# Patient Record
Sex: Female | Born: 1935
Health system: Southern US, Community
[De-identification: ages and names within clinical notes are randomized; demographics above are authoritative.]

## PROBLEM LIST (undated history)

## (undated) DIAGNOSIS — C50919 Malignant neoplasm of unspecified site of unspecified female breast: Secondary | ICD-10-CM

## (undated) DIAGNOSIS — I10 Essential (primary) hypertension: Secondary | ICD-10-CM

## (undated) DIAGNOSIS — E079 Disorder of thyroid, unspecified: Secondary | ICD-10-CM

## (undated) HISTORY — DX: Malignant neoplasm of unspecified site of unspecified female breast: C50.919

## (undated) HISTORY — PX: ABDOMINAL HYSTERECTOMY: SHX81

## (undated) HISTORY — DX: Essential (primary) hypertension: I10

## (undated) HISTORY — PX: BREAST LUMPECTOMY: SHX2

## (undated) HISTORY — DX: Disorder of thyroid, unspecified: E07.9

---

## 2016-03-06 DIAGNOSIS — H04123 Dry eye syndrome of bilateral lacrimal glands: Secondary | ICD-10-CM | POA: Diagnosis not present

## 2016-03-06 DIAGNOSIS — H2513 Age-related nuclear cataract, bilateral: Secondary | ICD-10-CM | POA: Diagnosis not present

## 2016-03-06 DIAGNOSIS — H35033 Hypertensive retinopathy, bilateral: Secondary | ICD-10-CM | POA: Diagnosis not present

## 2016-06-26 DIAGNOSIS — N289 Disorder of kidney and ureter, unspecified: Secondary | ICD-10-CM | POA: Diagnosis not present

## 2016-06-26 DIAGNOSIS — I10 Essential (primary) hypertension: Secondary | ICD-10-CM | POA: Diagnosis not present

## 2016-06-26 DIAGNOSIS — R1030 Lower abdominal pain, unspecified: Secondary | ICD-10-CM | POA: Diagnosis not present

## 2016-06-26 DIAGNOSIS — E0590 Thyrotoxicosis, unspecified without thyrotoxic crisis or storm: Secondary | ICD-10-CM | POA: Diagnosis not present

## 2016-06-26 DIAGNOSIS — R1033 Periumbilical pain: Secondary | ICD-10-CM | POA: Diagnosis not present

## 2016-06-26 DIAGNOSIS — C50919 Malignant neoplasm of unspecified site of unspecified female breast: Secondary | ICD-10-CM | POA: Diagnosis not present

## 2016-06-26 DIAGNOSIS — Z853 Personal history of malignant neoplasm of breast: Secondary | ICD-10-CM | POA: Diagnosis not present

## 2016-07-01 DIAGNOSIS — N644 Mastodynia: Secondary | ICD-10-CM | POA: Diagnosis not present

## 2017-03-12 DIAGNOSIS — H35033 Hypertensive retinopathy, bilateral: Secondary | ICD-10-CM | POA: Diagnosis not present

## 2017-03-12 DIAGNOSIS — H2513 Age-related nuclear cataract, bilateral: Secondary | ICD-10-CM | POA: Diagnosis not present

## 2017-10-06 DIAGNOSIS — R9431 Abnormal electrocardiogram [ECG] [EKG]: Secondary | ICD-10-CM | POA: Diagnosis not present

## 2017-10-06 DIAGNOSIS — R109 Unspecified abdominal pain: Secondary | ICD-10-CM | POA: Diagnosis not present

## 2017-10-06 DIAGNOSIS — I517 Cardiomegaly: Secondary | ICD-10-CM | POA: Diagnosis not present

## 2017-10-06 DIAGNOSIS — I491 Atrial premature depolarization: Secondary | ICD-10-CM | POA: Diagnosis not present

## 2017-11-03 DIAGNOSIS — I491 Atrial premature depolarization: Secondary | ICD-10-CM | POA: Diagnosis not present

## 2017-11-03 DIAGNOSIS — I5023 Acute on chronic systolic (congestive) heart failure: Secondary | ICD-10-CM | POA: Diagnosis not present

## 2017-11-03 DIAGNOSIS — R0602 Shortness of breath: Secondary | ICD-10-CM | POA: Diagnosis not present

## 2017-11-03 DIAGNOSIS — N179 Acute kidney failure, unspecified: Secondary | ICD-10-CM | POA: Diagnosis not present

## 2017-11-03 DIAGNOSIS — R7989 Other specified abnormal findings of blood chemistry: Secondary | ICD-10-CM | POA: Diagnosis not present

## 2017-11-03 DIAGNOSIS — R1030 Lower abdominal pain, unspecified: Secondary | ICD-10-CM | POA: Diagnosis not present

## 2017-11-03 DIAGNOSIS — J811 Chronic pulmonary edema: Secondary | ICD-10-CM | POA: Diagnosis not present

## 2017-11-03 DIAGNOSIS — I517 Cardiomegaly: Secondary | ICD-10-CM | POA: Diagnosis not present

## 2017-11-03 DIAGNOSIS — I130 Hypertensive heart and chronic kidney disease with heart failure and stage 1 through stage 4 chronic kidney disease, or unspecified chronic kidney disease: Secondary | ICD-10-CM | POA: Diagnosis not present

## 2017-11-03 DIAGNOSIS — N183 Chronic kidney disease, stage 3 (moderate): Secondary | ICD-10-CM | POA: Diagnosis not present

## 2017-11-03 DIAGNOSIS — F0390 Unspecified dementia without behavioral disturbance: Secondary | ICD-10-CM | POA: Diagnosis not present

## 2017-11-03 DIAGNOSIS — I723 Aneurysm of iliac artery: Secondary | ICD-10-CM | POA: Diagnosis not present

## 2017-11-03 DIAGNOSIS — K828 Other specified diseases of gallbladder: Secondary | ICD-10-CM | POA: Diagnosis not present

## 2017-11-03 DIAGNOSIS — N390 Urinary tract infection, site not specified: Secondary | ICD-10-CM | POA: Diagnosis not present

## 2017-11-03 DIAGNOSIS — I110 Hypertensive heart disease with heart failure: Secondary | ICD-10-CM | POA: Diagnosis not present

## 2017-11-03 DIAGNOSIS — R918 Other nonspecific abnormal finding of lung field: Secondary | ICD-10-CM | POA: Diagnosis not present

## 2017-11-03 DIAGNOSIS — E8770 Fluid overload, unspecified: Secondary | ICD-10-CM | POA: Diagnosis not present

## 2017-11-03 DIAGNOSIS — I5021 Acute systolic (congestive) heart failure: Secondary | ICD-10-CM | POA: Diagnosis not present

## 2017-11-03 DIAGNOSIS — I5031 Acute diastolic (congestive) heart failure: Secondary | ICD-10-CM | POA: Diagnosis not present

## 2017-11-03 DIAGNOSIS — J90 Pleural effusion, not elsewhere classified: Secondary | ICD-10-CM | POA: Diagnosis not present

## 2017-11-04 DIAGNOSIS — I10 Essential (primary) hypertension: Secondary | ICD-10-CM | POA: Diagnosis not present

## 2017-11-04 DIAGNOSIS — I340 Nonrheumatic mitral (valve) insufficiency: Secondary | ICD-10-CM | POA: Diagnosis not present

## 2017-11-04 DIAGNOSIS — J90 Pleural effusion, not elsewhere classified: Secondary | ICD-10-CM | POA: Diagnosis not present

## 2017-11-04 DIAGNOSIS — F0390 Unspecified dementia without behavioral disturbance: Secondary | ICD-10-CM | POA: Diagnosis not present

## 2017-11-04 DIAGNOSIS — I517 Cardiomegaly: Secondary | ICD-10-CM | POA: Diagnosis not present

## 2017-11-04 DIAGNOSIS — I5021 Acute systolic (congestive) heart failure: Secondary | ICD-10-CM | POA: Diagnosis not present

## 2017-11-04 DIAGNOSIS — I723 Aneurysm of iliac artery: Secondary | ICD-10-CM | POA: Diagnosis not present

## 2017-11-04 DIAGNOSIS — K828 Other specified diseases of gallbladder: Secondary | ICD-10-CM | POA: Diagnosis not present

## 2017-11-04 DIAGNOSIS — I509 Heart failure, unspecified: Secondary | ICD-10-CM | POA: Diagnosis not present

## 2017-11-04 DIAGNOSIS — R7989 Other specified abnormal findings of blood chemistry: Secondary | ICD-10-CM | POA: Diagnosis not present

## 2017-11-04 DIAGNOSIS — N179 Acute kidney failure, unspecified: Secondary | ICD-10-CM | POA: Diagnosis not present

## 2017-11-05 DIAGNOSIS — N183 Chronic kidney disease, stage 3 (moderate): Secondary | ICD-10-CM | POA: Diagnosis present

## 2017-11-05 DIAGNOSIS — Z8249 Family history of ischemic heart disease and other diseases of the circulatory system: Secondary | ICD-10-CM | POA: Diagnosis not present

## 2017-11-05 DIAGNOSIS — Z9089 Acquired absence of other organs: Secondary | ICD-10-CM | POA: Diagnosis not present

## 2017-11-05 DIAGNOSIS — R7989 Other specified abnormal findings of blood chemistry: Secondary | ICD-10-CM | POA: Diagnosis not present

## 2017-11-05 DIAGNOSIS — Z9889 Other specified postprocedural states: Secondary | ICD-10-CM | POA: Diagnosis not present

## 2017-11-05 DIAGNOSIS — N390 Urinary tract infection, site not specified: Secondary | ICD-10-CM | POA: Diagnosis present

## 2017-11-05 DIAGNOSIS — I5021 Acute systolic (congestive) heart failure: Secondary | ICD-10-CM | POA: Diagnosis not present

## 2017-11-05 DIAGNOSIS — I719 Aortic aneurysm of unspecified site, without rupture: Secondary | ICD-10-CM | POA: Diagnosis present

## 2017-11-05 DIAGNOSIS — J90 Pleural effusion, not elsewhere classified: Secondary | ICD-10-CM | POA: Diagnosis not present

## 2017-11-05 DIAGNOSIS — Z7982 Long term (current) use of aspirin: Secondary | ICD-10-CM | POA: Diagnosis not present

## 2017-11-05 DIAGNOSIS — R109 Unspecified abdominal pain: Secondary | ICD-10-CM | POA: Diagnosis present

## 2017-11-05 DIAGNOSIS — Z79891 Long term (current) use of opiate analgesic: Secondary | ICD-10-CM | POA: Diagnosis not present

## 2017-11-05 DIAGNOSIS — Z853 Personal history of malignant neoplasm of breast: Secondary | ICD-10-CM | POA: Diagnosis not present

## 2017-11-05 DIAGNOSIS — I5023 Acute on chronic systolic (congestive) heart failure: Secondary | ICD-10-CM | POA: Diagnosis present

## 2017-11-05 DIAGNOSIS — I723 Aneurysm of iliac artery: Secondary | ICD-10-CM | POA: Diagnosis not present

## 2017-11-05 DIAGNOSIS — F0390 Unspecified dementia without behavioral disturbance: Secondary | ICD-10-CM | POA: Diagnosis not present

## 2017-11-05 DIAGNOSIS — I509 Heart failure, unspecified: Secondary | ICD-10-CM | POA: Diagnosis not present

## 2017-11-05 DIAGNOSIS — I10 Essential (primary) hypertension: Secondary | ICD-10-CM | POA: Diagnosis not present

## 2017-11-05 DIAGNOSIS — I130 Hypertensive heart and chronic kidney disease with heart failure and stage 1 through stage 4 chronic kidney disease, or unspecified chronic kidney disease: Secondary | ICD-10-CM | POA: Diagnosis present

## 2017-11-05 DIAGNOSIS — Z79899 Other long term (current) drug therapy: Secondary | ICD-10-CM | POA: Diagnosis not present

## 2017-11-05 DIAGNOSIS — N179 Acute kidney failure, unspecified: Secondary | ICD-10-CM | POA: Diagnosis not present

## 2017-11-19 ENCOUNTER — Emergency Department (HOSPITAL_COMMUNITY): Payer: Medicare Other

## 2017-11-19 ENCOUNTER — Other Ambulatory Visit: Payer: Self-pay

## 2017-11-19 ENCOUNTER — Emergency Department (HOSPITAL_COMMUNITY)
Admission: EM | Admit: 2017-11-19 | Discharge: 2017-11-19 | Disposition: A | Discharge status: Home / Self Care | Payer: Medicare Other | Attending: Emergency Medicine | Admitting: Emergency Medicine

## 2017-11-19 DIAGNOSIS — R079 Chest pain, unspecified: Secondary | ICD-10-CM | POA: Diagnosis not present

## 2017-11-19 DIAGNOSIS — G479 Sleep disorder, unspecified: Secondary | ICD-10-CM | POA: Diagnosis not present

## 2017-11-19 DIAGNOSIS — I480 Paroxysmal atrial fibrillation: Secondary | ICD-10-CM | POA: Diagnosis not present

## 2017-11-19 DIAGNOSIS — J9811 Atelectasis: Secondary | ICD-10-CM | POA: Diagnosis not present

## 2017-11-19 DIAGNOSIS — R7989 Other specified abnormal findings of blood chemistry: Secondary | ICD-10-CM | POA: Diagnosis not present

## 2017-11-19 DIAGNOSIS — J90 Pleural effusion, not elsewhere classified: Secondary | ICD-10-CM | POA: Diagnosis not present

## 2017-11-19 DIAGNOSIS — Z5321 Procedure and treatment not carried out due to patient leaving prior to being seen by health care provider: Secondary | ICD-10-CM | POA: Diagnosis not present

## 2017-11-19 DIAGNOSIS — Z853 Personal history of malignant neoplasm of breast: Secondary | ICD-10-CM | POA: Diagnosis not present

## 2017-11-19 DIAGNOSIS — Z0000 Encounter for general adult medical examination without abnormal findings: Secondary | ICD-10-CM | POA: Diagnosis not present

## 2017-11-19 DIAGNOSIS — I10 Essential (primary) hypertension: Secondary | ICD-10-CM | POA: Diagnosis not present

## 2017-11-19 DIAGNOSIS — Z8639 Personal history of other endocrine, nutritional and metabolic disease: Secondary | ICD-10-CM | POA: Diagnosis not present

## 2017-11-19 LAB — CBC
HCT: 35 % — ABNORMAL LOW (ref 36.0–46.0)
Hemoglobin: 11.2 g/dL — ABNORMAL LOW (ref 12.0–15.0)
MCH: 28.4 pg (ref 26.0–34.0)
MCHC: 32 g/dL (ref 30.0–36.0)
MCV: 88.6 fL (ref 78.0–100.0)
Platelets: 244 10*3/uL (ref 150–400)
RBC: 3.95 MIL/uL (ref 3.87–5.11)
RDW: 14.7 % (ref 11.5–15.5)
WBC: 7.2 10*3/uL (ref 4.0–10.5)

## 2017-11-19 LAB — BASIC METABOLIC PANEL
Anion gap: 10 (ref 5–15)
BUN: 15 mg/dL (ref 6–20)
CO2: 25 mmol/L (ref 22–32)
Calcium: 8.9 mg/dL (ref 8.9–10.3)
Chloride: 102 mmol/L (ref 101–111)
Creatinine, Ser: 0.98 mg/dL (ref 0.44–1.00)
GFR calc Af Amer: 60 mL/min — ABNORMAL LOW (ref >60)
GFR calc non Af Amer: 52 mL/min — ABNORMAL LOW (ref >60)
Glucose, Bld: 95 mg/dL (ref 65–99)
Potassium: 4.3 mmol/L (ref 3.5–5.1)
Sodium: 137 mmol/L (ref 135–145)

## 2017-11-19 LAB — I-STAT TROPONIN, ED: Troponin i, poc: 0.04 ng/mL (ref 0.00–0.08)

## 2017-11-19 NOTE — ED Notes (Signed)
FAX sent from doctor's office until roomed

## 2017-11-19 NOTE — ED Notes (Signed)
Pt. Stated they were tired and wanted to leave. Pt. Has left.

## 2017-11-19 NOTE — ED Triage Notes (Signed)
Pt sent from MD's office for elevated cardiac enzymes. Pt denies chest pain/shortness of breath. States she is transitioning from [place]. To [place]. Information from MDs office trop I 0.07.

## 2017-11-20 ENCOUNTER — Encounter: Payer: Self-pay | Admitting: Cardiovascular Disease

## 2017-11-20 ENCOUNTER — Other Ambulatory Visit: Payer: Self-pay | Admitting: Nurse Practitioner

## 2017-11-20 ENCOUNTER — Ambulatory Visit (INDEPENDENT_AMBULATORY_CARE_PROVIDER_SITE_OTHER): Payer: BLUE CROSS/BLUE SHIELD | Admitting: Cardiovascular Disease

## 2017-11-20 VITALS — BP 148/89 | HR 75 | Resp 16 | Ht 64.0 in | Wt 141.8 lb

## 2017-11-20 DIAGNOSIS — I509 Heart failure, unspecified: Secondary | ICD-10-CM

## 2017-11-20 DIAGNOSIS — E7800 Pure hypercholesterolemia, unspecified: Secondary | ICD-10-CM

## 2017-11-20 DIAGNOSIS — I10 Essential (primary) hypertension: Secondary | ICD-10-CM

## 2017-11-20 DIAGNOSIS — E0590 Thyrotoxicosis, unspecified without thyrotoxic crisis or storm: Secondary | ICD-10-CM

## 2017-11-20 DIAGNOSIS — Z853 Personal history of malignant neoplasm of breast: Secondary | ICD-10-CM | POA: Diagnosis not present

## 2017-11-20 DIAGNOSIS — R072 Precordial pain: Secondary | ICD-10-CM | POA: Diagnosis not present

## 2017-11-20 DIAGNOSIS — R7989 Other specified abnormal findings of blood chemistry: Secondary | ICD-10-CM | POA: Diagnosis not present

## 2017-11-20 NOTE — Progress Notes (Signed)
Cardiology Consultation Note:    Date:  11/20/2017   ID:  Diane Ross, DOB [birth year], MRN [id number]  PCP:  [name], NP  Cardiologist:  New  Referring MD: [name], NP  Chief Complaint  Patient presents with  . Hospitalization Follow-up    elevated troponin, chest pain   Diane Ross is a 84 y.o. female who is being seen today for the evaluation of abnormal troponin at the request of [name], NP  History of Present Illness:    Diane Ross is a 84 y.o. female with a hx of long-standing hypertension, possible recent diagnosis of congestive heart failure, referred for evaluation of ECG abnormalities and abnormal troponin level checked yesterday.  Diane Ross has relocated to [facility] from [place] and will be living here with her daughter.  She has long-standing high blood pressure, but has not had previous problems with cardiac illness until last month.  She moved to one of her brother's home and believes that all her problems began because she was sleeping close to the furnace and was somehow exposed to abnormal fumes.  It sounds like she developed shortness of breath and was hospitalized for 4 days and discharged with a diagnosis of congestive heart failure.  She is not confident, but believes that she was told that her heart was weak.  Her discharge medications included carvedilol and furosemide.  She is also taking atenolol.  We do not have the records from the hospital stay in [place].    She was seen in the [place] office yesterday.  She was not having chest discomfort at the time, but reported that she had had chest discomfort off and on over the last month.  She finds it very hard to describe her symptoms, keeps returning to the issues related to the emissions from her brother's furnace.  In the office, the ECG showed frequent ectopy and there was some concern about atrial fibrillation, but the ECG only shows PACs, sometimes sequential.  An  event monitor Zio patch was placed. After going home, she was called back and told that her cardiac troponin I was elevated (0.07, upper limit of normal 0.04) and she was referred to the emergency room.  She did check into the emergency room at [facility], but left after a 3-hour wait., after being told that there were still 20 patients ahead of her.  ED ECG showed sinus rhythm with PACs, LVH with repolarization abnormalities. Troponin was only 0.04 (point-of-care).  Creatinine was 0.98, potassium 4.3, hemoglobin 11.2. Chest x-ray showed small bilateral effusions and bibasilar atelectasis, right greater than left, "negative for edema".  She states that she feels fine today.  She denies any problems with shortness of breath.  She denies orthopnea or PND.  Her daughter states that she sleeps on 2 pillows and that she has noted that her breathing seems to be labored while asleep.  She has not had palpitations or syncope.  She denies any recent chest discomfort.  She does have ankle swelling, but but does not believe that it is a big deal.  Notably her labs from [facility] also include a TSH of 0.051.  She does not take thyroid supplements.  She has not smoked since 1954.  She has a history of left breast cancer treated with lumpectomy.  She has previously undergone hysterectomy.  Past Medical History:  Diagnosis Date  . Breast cancer ([place])    left side  . Hypertension   . Thyroid disorder  Past Surgical History:  Procedure Laterality Date  . ABDOMINAL HYSTERECTOMY    . BREAST LUMPECTOMY Left     Current Medications: Current Meds  Medication Sig  . aspirin EC 81 MG tablet Take 81 mg by mouth daily.  . carvedilol (COREG) 3.125 MG tablet Take 3.125 mg by mouth 2 (two) times daily with a meal.  . furosemide (LASIX) 40 MG tablet Take 40 mg by mouth daily.  . potassium chloride SA (K-DUR,KLOR-CON) 20 MEQ tablet Take 20 mEq by mouth daily.  . [DISCONTINUED] atenolol (TENORMIN) 50 MG tablet Take 25  mg by mouth daily.     Allergies:   Patient has no known allergies.   Social History   Socioeconomic History  . Marital status: Widowed    Spouse name: None  . Number of children: None  . Years of education: None  . Highest education level: None  Social Needs  . Financial resource strain: None  . Food insecurity - worry: None  . Food insecurity - inability: None  . Transportation needs - medical: None  . Transportation needs - non-medical: None  Occupational History  . Occupation: retired  Tobacco Use  . Smoking status: Never Smoker  . Smokeless tobacco: Never Used  Substance and Sexual Activity  . Alcohol use: No    Frequency: Never  . Drug use: No  . Sexual activity: No  Other Topics Concern  . None  Social History Narrative  . None     Family History: The patient's mother had diabetes.   1 of her brothers had diabetes and drug issues and has passed away.  Another brother is alive and well at age 24.  She has a healthy sister.  2 of her daughters have hypertension there does not appear to be a prominent family history of coronary disease. ROS:   Please see the history of present illness.    all other systems reviewed and are negative.  EKGs/Labs/Other Studies Reviewed:    The following studies were reviewed today: Notes from primary care provider.  There are no records from her recent hospitalization in [place]   EKG:  EKG is  ordered today.  The ekg ordered today demonstrates normal sinus rhythm with only voltage criteria for LVH and without any repolarization changes.  Borderline QTC 469 ms  Last night ECG at [facility] ED shows sinus rhythm with left ventricular hypertrophy and prominent repolarization changes in leads I, aVL and V6 PACs and atrial couplets are seen.  The QTC was 470 ms  Recent Labs: 11/19/2017: BUN 15; Creatinine, Ser 0.98; Hemoglobin 11.2; Platelets 244; Potassium 4.3; Sodium 137  Recent Lipid Panel Total cholesterol 160, triglycerides 72,  HDL 31, calculated LDL 115, creatinine 0.96, sodium 137, potassium 5.0, hemoglobin 11.6, TSH 0.051, normal liver function tests (labs performed February 6 at [facility])  Physical Exam:    VS:  BP (!) 148/89   Pulse 75   Resp 16   Ht 5\' 4"  (1.626 m)   Wt 141 lb 12.8 oz (64.3 kg)   SpO2 97%   BMI 24.34 kg/m     Wt Readings from Last 3 Encounters:  11/20/17 141 lb 12.8 oz (64.3 kg)  11/19/17 142 lb (64.4 kg)     GEN:  Well nourished, well developed in no acute distress HEENT: Normal NECK: No JVD; No carotid bruits LYMPHATICS: No lymphadenopathy CARDIAC: RRR, no murmurs, rubs, gallops RESPIRATORY:  Clear to auscultation without rales, wheezing or rhonchi  ABDOMEN: Soft, non-tender, non-distended MUSCULOSKELETAL:  Symmetrical 2+ ankle edema; No deformity  SKIN: Warm and dry NEUROLOGIC:  Alert and oriented x 3 PSYCHIATRIC:  Normal affect   ASSESSMENT:    1. Precordial chest pain   2. Acute congestive heart failure, unspecified heart failure type ([place])   3. Essential hypertension   4. Hypercholesterolemia   5. Abnormal TSH   6. History of cancer of left breast    PLAN:    In order of problems listed above:  1. Chest pain: It is difficult to get her to give a detailed description about the symptoms.  They do not appear to be exertional.  I think the minimal elevation in troponin is a bit of a red herring.   It was not confirmed by repeat testing later the same day.  It is possible that it signifies congestive heart failure rather than coronary ischemia. I am however concerned about the dynamic nature of the repolarization changes on her ECG. With her age, coronary disease is definitely a possibility and I think she should undergo a Lexiscan Myoview.  This will also provide [place] some preliminary information about her left ventricular systolic function. 2. CHF: History from last month in [place] strongly suggestive of an episode of heart failure exacerbation.  It is possible that it was  not exposure to admissions from the furnace that caused the problem, but a change in her dietary intake of sodium when her living conditions changed.  We do not have the records, but the fact that she was started on carvedilol does suggest that she was found to have depressed LVEF.  Discussed sodium restriction, daily weight monitoring, weight log, signs and symptoms of heart failure exacerbation.  We will get records from [place].  If we can get her hands on these, we will need to repeat her echocardiogram.  Add ARB. 3. HTN: Whether she has systolic or diastolic heart failure her blood pressure is too high.  Add losartan 25 mg once daily, continue carvedilol, stop atenolol, reevaluate in clinic in a few days. 4. HLP: LDL cholesterol 115 is not too bad, but if coronary disease is identified that the target would be 17 mg/deciliter or less. 5. Hyperthyroidism: She is not tachycardic but is on a beta-blocker.  No other clear findings to suggest thyrotoxicosis.  Needs a more comprehensive thyroid profile and probably a referral to endocrinology.  I do not see a goiter. 6. Hx left breast Ca: Need to find out whether she received radiation therapy or potentially cardiotoxic anthracycline chemotherapy.  I did not get a chance to ask that question today.   Medication Adjustments/Labs and Tests Ordered: Current medicines are reviewed at length with the patient today.  Concerns regarding medicines are outlined above.  Orders Placed This Encounter  Procedures  . MYOCARDIAL PERFUSION IMAGING  . EKG 12-Lead   No orders of the defined types were placed in this encounter.   Signed, Sanda Klein, MD  11/20/2017 7:54 PM    [place]

## 2017-11-20 NOTE — Patient Instructions (Signed)
Medication Instructions: Dr [name] has recommended making the following medication changes: 1. STOP Atenolol 2. START Losartan 25 mg - take 1 tablet by mouth daily  Labwork: NONE ORDERED  Testing/Procedures: 1. [place] physician has requested that you have a lexiscan myoview. For further information please visit [email or web address]. Please follow instruction sheet, as given.  Follow-up: Dr [name] recommends that you schedule a follow-up appointment in 1 month.  If you need a refill on your cardiac medications before your next appointment, please call your pharmacy.

## 2017-11-21 ENCOUNTER — Telehealth (HOSPITAL_COMMUNITY): Payer: Self-pay | Admitting: Radiology

## 2017-11-21 ENCOUNTER — Telehealth: Payer: Self-pay | Admitting: Cardiovascular Disease

## 2017-11-21 ENCOUNTER — Telehealth (HOSPITAL_COMMUNITY): Payer: Self-pay | Admitting: *Deleted

## 2017-11-21 NOTE — Telephone Encounter (Signed)
Patient given detailed instructions per Myocardial Perfusion Study Information Sheet for the test on 11/24/2017 at 10:00. Patient notified to arrive 15 minutes early and that it is imperative to arrive on time for appointment to keep from having the test rescheduled.  If you need to cancel or reschedule your appointment, please call the office within 24 hours of your appointment. . Patient verbalized understanding.EHK

## 2017-11-21 NOTE — Telephone Encounter (Signed)
Left message on voicemail in reference to upcoming appointment scheduled for 11/24/17. Phone number given for a call back so details instructions can be given. Diane Ross

## 2017-11-21 NOTE — Telephone Encounter (Signed)
Pt was here yesterday and her Losartan was called in.It is not at the pharmacy.Please call [facility] to [facility] on [facility].

## 2017-11-24 ENCOUNTER — Ambulatory Visit (HOSPITAL_COMMUNITY): Payer: Medicare Other | Attending: Cardiology

## 2017-11-24 DIAGNOSIS — I119 Hypertensive heart disease without heart failure: Secondary | ICD-10-CM | POA: Diagnosis not present

## 2017-11-24 DIAGNOSIS — R072 Precordial pain: Secondary | ICD-10-CM

## 2017-11-24 DIAGNOSIS — I2510 Atherosclerotic heart disease of native coronary artery without angina pectoris: Secondary | ICD-10-CM | POA: Diagnosis not present

## 2017-11-24 DIAGNOSIS — I428 Other cardiomyopathies: Secondary | ICD-10-CM | POA: Diagnosis not present

## 2017-11-24 DIAGNOSIS — R0602 Shortness of breath: Secondary | ICD-10-CM | POA: Diagnosis not present

## 2017-11-24 LAB — MYOCARDIAL PERFUSION IMAGING
LV dias vol: 136 mL (ref 46–106)
LV sys vol: 75 mL
Peak HR: 105 {beats}/min
RATE: 0.31
Rest HR: 68 {beats}/min
SDS: 0
SRS: 2
SSS: 2
TID: 0.87

## 2017-11-24 MED ORDER — TECHNETIUM TC 99M TETROFOSMIN IV KIT
10.5000 | PACK | Freq: Once | INTRAVENOUS | Status: AC | PRN
  Administered 2017-11-24: 10.5 via INTRAVENOUS
  Filled 2017-11-24: qty 11

## 2017-11-24 MED ORDER — TECHNETIUM TC 99M TETROFOSMIN IV KIT
31.5000 | PACK | Freq: Once | INTRAVENOUS | Status: AC | PRN
  Administered 2017-11-24: 31.5 via INTRAVENOUS
  Filled 2017-11-24: qty 32

## 2017-11-24 MED ORDER — REGADENOSON 0.4 MG/5ML IV SOLN
0.4000 mg | Freq: Once | INTRAVENOUS | Status: AC
  Administered 2017-11-24: 0.4 mg via INTRAVENOUS

## 2017-11-24 MED ORDER — LOSARTAN POTASSIUM 25 MG PO TABS: 25.0000 mg | ORAL_TABLET | Freq: Every day | ORAL | 3 refills | Status: DC

## 2017-11-25 ENCOUNTER — Encounter: Payer: Self-pay | Admitting: Adult Health

## 2017-11-25 ENCOUNTER — Ambulatory Visit (INDEPENDENT_AMBULATORY_CARE_PROVIDER_SITE_OTHER): Payer: Medicare Other | Admitting: Adult Health

## 2017-11-25 VITALS — BP 138/72 | HR 68 | Ht 66.0 in | Wt 135.6 lb

## 2017-11-25 DIAGNOSIS — I43 Cardiomyopathy in diseases classified elsewhere: Secondary | ICD-10-CM | POA: Diagnosis not present

## 2017-11-25 DIAGNOSIS — E0590 Thyrotoxicosis, unspecified without thyrotoxic crisis or storm: Secondary | ICD-10-CM

## 2017-11-25 DIAGNOSIS — I10 Essential (primary) hypertension: Secondary | ICD-10-CM | POA: Diagnosis not present

## 2017-11-25 NOTE — Patient Instructions (Signed)
Medication Instructions:  NO CHANGES-Your physician recommends that you continue on your current medications as directed. Please refer to the Current Medication list given to you today.  If you need a refill on your cardiac medications before your next appointment, please call your pharmacy.  Follow-Up: Your physician wants you to follow-up:  [place]  Thank you for choosing [facility] at [facility]!!

## 2017-11-25 NOTE — Progress Notes (Signed)
Cardiology Office Note   Date:  11/25/2017   ID:  Diane Ross, DOB [birth year], MRN [id number]  PCP:  [name], NP  Cardiologist: Dr.  [name]  Chief Complaint  Patient presents with  . Follow-up     History of Present Illness: Diane Ross is a 84 y.o. female who presents for ongoing assessment and management of CHF, long-standing hypertension. She was seen Dr. [name] oh on 11/20/2017 after moving from [place] to [place] to live with her daughter.  She moved to one of her brother's home and believes that all her problems began because she was sleeping close to the furnace and was somehow exposed to abnormal fumes.  It sounds like she developed shortness of breath and was hospitalized for 4 days and discharged with a diagnosis of congestive heart failure.  She is not confident, but believes that she was told that her heart was weak.  Her discharge medications included carvedilol and furosemide.  She is also taking atenolol.  When she was seen last EKG revealed frequent ectopy with concern for atrial fibrillation that it only showed frequent PACs, sometimes sequential. He has a mildly elevated troponin when last seen, was sent to the emergency room after waiting over 3 hours. Also being worked up by her primary care physician [facility] due to hyperthyroidism with most recent TSH 0.051.    Past Medical History:  Diagnosis Date  . Breast cancer ([place])    left side  . Hypertension   . Thyroid disorder     Past Surgical History:  Procedure Laterality Date  . ABDOMINAL HYSTERECTOMY    . BREAST LUMPECTOMY Left      Current Outpatient Medications  Medication Sig Dispense Refill  . aspirin EC 81 MG tablet Take 81 mg by mouth daily.    . carvedilol (COREG) 3.125 MG tablet Take 3.125 mg by mouth 2 (two) times daily with a meal.    . furosemide (LASIX) 40 MG tablet Take 40 mg by mouth daily.    Marland Kitchen losartan (COZAAR) 25 MG tablet Take 1 tablet (25 mg total) by mouth  daily. 90 tablet 3  . potassium chloride SA (K-DUR,KLOR-CON) 20 MEQ tablet Take 20 mEq by mouth daily.     No current facility-administered medications for this visit.     Allergies:   Patient has no known allergies.    Social History:  The patient  reports that  has never smoked. she has never used smokeless tobacco. She reports that she does not drink alcohol or use drugs.   Family History:  The patient's family history is not on file.    ROS: All other systems are reviewed and negative. Unless otherwise mentioned in H&P    PHYSICAL EXAM: VS:  BP 138/72   Pulse 68   Ht 5\' 6"  (1.676 m)   Wt 135 lb 9.6 oz (61.5 kg)   BMI 21.89 kg/m  , BMI Body mass index is 21.89 kg/m. GEN: Well nourished, well developed, in no acute distress  HEENT: normal  Neck: no JVD, carotid bruits, or masses Cardiac: RRR; no murmurs, rubs, or gallops,no edema  Respiratory:  Clear to auscultation bilaterally, normal work of breathing GI: soft, nontender, nondistended, + BS MS: no deformity or atrophy  Skin: warm and dry, no rash Neuro:  Strength and sensation are intact Psych: euthymic mood, full affect   EKG:  Normal sinus rhythm, nonspecific T-wave abnormality inferiorly. Heart is 60 bpm.  Recent Labs: 11/19/2017: BUN 15; Creatinine, Ser 0.98;  Hemoglobin 11.2; Platelets 244; Potassium 4.3; Sodium 137    Lipid Panel No results found for: CHOL, TRIG, HDL, CHOLHDL, VLDL, LDLCALC, LDLDIRECT    Wt Readings from Last 3 Encounters:  11/25/17 135 lb 9.6 oz (61.5 kg)  11/24/17 141 lb (64 kg)  11/20/17 141 lb 12.8 oz (64.3 kg)      Other studies Reviewed:  Study Highlights     Nuclear stress EF: 45%. Mild diffuse hypokinesis  There was no ST segment deviation noted during stress.  The study is normal. No perfusion defects identified. Dilated LV.  This is an intermediate risk study based upon mildly reduced left ventricular ejection fraction. Non-ischemic cardiomyopathy.          ASSESSMENT AND PLAN:  1. History of systolic CHF: I have looked through Epic to locate any records that may have been sent from [place] as requested by Dr. [name]. These are not available to review. She is currently euvolemic, medically compliant, has some mild complaints dyspnea walking back from the lobby to the exam room today it is not overly dyspneic..   2. Hypertension: The patient was started on losartan 25 mg daily, continued on Lasix 40 mg, and carvedilol. Blood pressure is improved since last office visit, now at 138/72. He is only been taking this medication for the last 2 days and each day her blood pressure has gone down. Her daughter she is living with now is an Therapist, sports and takes her blood pressure.  3. Hyperthyroidism: Currently being worked up by PCP. She is scheduled for a thyroid ultrasound and has been started on medication, but she does not know the name of it. [facility] is also placed cardiac monitor on her to evaluate for rapid heart rhythm and evidence of persistent atrial fibrillation.  Current medicines are reviewed at length with the patient today.    Labs/ tests ordered today include: None  Phill Myron. West Pugh, ANP, AACC   11/25/2017 12:45 PM    [facility] [address] [place], [place] [address] Phone: [phone]; Fax: [phone]

## 2017-11-26 ENCOUNTER — Ambulatory Visit
Admission: RE | Admit: 2017-11-26 | Discharge: 2017-11-26 | Disposition: A | Discharge status: Home / Self Care | Payer: Medicare Other | Source: Ambulatory Visit | Attending: Nurse Practitioner | Admitting: Nurse Practitioner

## 2017-11-26 DIAGNOSIS — E0590 Thyrotoxicosis, unspecified without thyrotoxic crisis or storm: Secondary | ICD-10-CM

## 2017-11-26 DIAGNOSIS — E042 Nontoxic multinodular goiter: Secondary | ICD-10-CM | POA: Diagnosis not present

## 2017-11-27 ENCOUNTER — Other Ambulatory Visit: Payer: Self-pay | Admitting: Nurse Practitioner

## 2017-11-27 DIAGNOSIS — E041 Nontoxic single thyroid nodule: Secondary | ICD-10-CM

## 2017-11-27 DIAGNOSIS — E0590 Thyrotoxicosis, unspecified without thyrotoxic crisis or storm: Secondary | ICD-10-CM

## 2017-12-10 DIAGNOSIS — I480 Paroxysmal atrial fibrillation: Secondary | ICD-10-CM | POA: Diagnosis not present

## 2017-12-15 ENCOUNTER — Other Ambulatory Visit: Payer: Self-pay | Admitting: Cardiovascular Disease

## 2017-12-15 MED ORDER — LOSARTAN POTASSIUM 25 MG PO TABS: 25.0000 mg | ORAL_TABLET | Freq: Every day | ORAL | 1 refills | Status: DC

## 2017-12-15 NOTE — Telephone Encounter (Signed)
Rx(s) sent to pharmacy electronically.  

## 2017-12-15 NOTE — Telephone Encounter (Signed)
°*  STAT* If patient is at the pharmacy, call can be transferred to refill team.   1. Which medications need to be refilled? (please list name of each medication and dose if known) Losartan 25 mg   2. Which pharmacy/location (including street and city if local pharmacy) is medication to be sent to? [address], [place], [place] [address]   3. Do they need a 30 day or 90 day supply? Enough to last to appt on 01-02-18

## 2017-12-26 DIAGNOSIS — R29890 Loss of height: Secondary | ICD-10-CM | POA: Diagnosis not present

## 2017-12-26 DIAGNOSIS — E042 Nontoxic multinodular goiter: Secondary | ICD-10-CM | POA: Diagnosis not present

## 2017-12-26 DIAGNOSIS — D649 Anemia, unspecified: Secondary | ICD-10-CM | POA: Diagnosis not present

## 2017-12-26 DIAGNOSIS — M40204 Unspecified kyphosis, thoracic region: Secondary | ICD-10-CM | POA: Diagnosis not present

## 2017-12-26 DIAGNOSIS — Z13820 Encounter for screening for osteoporosis: Secondary | ICD-10-CM | POA: Diagnosis not present

## 2017-12-26 DIAGNOSIS — E0590 Thyrotoxicosis, unspecified without thyrotoxic crisis or storm: Secondary | ICD-10-CM | POA: Diagnosis not present

## 2017-12-26 DIAGNOSIS — I480 Paroxysmal atrial fibrillation: Secondary | ICD-10-CM | POA: Diagnosis not present

## 2018-01-02 ENCOUNTER — Ambulatory Visit (INDEPENDENT_AMBULATORY_CARE_PROVIDER_SITE_OTHER): Payer: Medicare Other | Admitting: Cardiovascular Disease

## 2018-01-02 ENCOUNTER — Encounter: Payer: Self-pay | Admitting: Cardiovascular Disease

## 2018-01-02 VITALS — BP 152/86 | HR 66 | Ht 66.0 in | Wt 126.0 lb

## 2018-01-02 DIAGNOSIS — E0590 Thyrotoxicosis, unspecified without thyrotoxic crisis or storm: Secondary | ICD-10-CM | POA: Diagnosis not present

## 2018-01-02 DIAGNOSIS — I10 Essential (primary) hypertension: Secondary | ICD-10-CM | POA: Insufficient documentation

## 2018-01-02 DIAGNOSIS — R636 Underweight: Secondary | ICD-10-CM

## 2018-01-02 DIAGNOSIS — E7800 Pure hypercholesterolemia, unspecified: Secondary | ICD-10-CM

## 2018-01-02 DIAGNOSIS — D649 Anemia, unspecified: Secondary | ICD-10-CM | POA: Diagnosis not present

## 2018-01-02 DIAGNOSIS — I5042 Chronic combined systolic (congestive) and diastolic (congestive) heart failure: Secondary | ICD-10-CM

## 2018-01-02 MED ORDER — CARVEDILOL 6.25 MG PO TABS: 6.2500 mg | ORAL_TABLET | Freq: Two times a day (BID) | ORAL | 3 refills | Status: DC

## 2018-01-02 NOTE — Progress Notes (Signed)
Cardiology office note:    Date:  01/02/2018   ID:  Diane Ross, DOB 05-25-1931, MRN [id number]  PCP:  [name], NP  Cardiologist:  New  Referring MD: [name], NP  Chief Complaint  Patient presents with  . Congestive Heart Failure    Follow-up     History of Present Illness:    Diane Ross is a 84 y.o. female with a hx of long-standing hypertension, recent diagnosis of congestive heart failure, returning in follow-up after nuclear stress test..  [place] study confirmed the presence of depressed left ventricular systolic function, EF 60%.  No perfusion abnormalities were seen.  Since her last appointment her weight has decreased by 10 pounds, a total of 16 pounds since February.  She denies orthopnea exertional dyspnea and no longer has leg edema.  She has not been troubled by palpitations, syncope, dizziness, focal neurological complaints or chest pain.  We have not been able to find her previous echo test results yet.  She has not smoked since 1954.  She has a history of left breast cancer treated with lumpectomy.  She is not certain, but believes she may have received chemotherapy.  She does not recognize the words Adriamycin or doxorubicin.  She has previously undergone hysterectomy.  Past Medical History:  Diagnosis Date  . Breast cancer ([place])    left side  . Hypertension   . Thyroid disorder     Past Surgical History:  Procedure Laterality Date  . ABDOMINAL HYSTERECTOMY    . BREAST LUMPECTOMY Left     Current Medications: Current Meds  Medication Sig  . aspirin EC 81 MG tablet Take 81 mg by mouth daily.  . carvedilol (COREG) 6.25 MG tablet Take 1 tablet (6.25 mg total) by mouth 2 (two) times daily with a meal.  . furosemide (LASIX) 40 MG tablet Take 40 mg by mouth daily.  Marland Kitchen losartan (COZAAR) 25 MG tablet Take 1 tablet (25 mg total) by mouth daily.  . potassium chloride SA (K-DUR,KLOR-CON) 20 MEQ tablet Take 20 mEq by mouth daily.  .  [DISCONTINUED] carvedilol (COREG) 3.125 MG tablet Take 3.125 mg by mouth 2 (two) times daily with a meal.     Allergies:   Patient has no known allergies.   Social History   Socioeconomic History  . Marital status: Widowed    Spouse name: Not on file  . Number of children: Not on file  . Years of education: Not on file  . Highest education level: Not on file  Occupational History  . Occupation: retired  Scientific laboratory technician  . Financial resource strain: Not on file  . Food insecurity:    Worry: Not on file    Inability: Not on file  . Transportation needs:    Medical: Not on file    Non-medical: Not on file  Tobacco Use  . Smoking status: Never Smoker  . Smokeless tobacco: Never Used  Substance and Sexual Activity  . Alcohol use: No    Frequency: Never  . Drug use: No  . Sexual activity: Never  Lifestyle  . Physical activity:    Days per week: Not on file    Minutes per session: Not on file  . Stress: Not on file  Relationships  . Social connections:    Talks on phone: Not on file    Gets together: Not on file    Attends religious service: Not on file    Active member of club or organization: Not on file  Attends meetings of clubs or organizations: Not on file    Relationship status: Not on file  Other Topics Concern  . Not on file  Social History Narrative  . Not on file     Family History: The patient's mother had diabetes.   1 of her brothers had diabetes and drug issues and has passed away.  Another brother is alive and well at age 43.  She has a healthy sister.  2 of her daughters have hypertension there does not appear to be a prominent family history of coronary disease. ROS:   Please see the history of present illness.    all other systems reviewed and are negative.  EKGs/Labs/Other Studies Reviewed:    The following studies were reviewed today: Notes from primary care provider.  There are no records from her recent hospitalization in [place]   EKG:   EKG is not ordered today.    Recent Labs: 11/19/2017: BUN 15; Creatinine, Ser 0.98; Hemoglobin 11.2; Platelets 244; Potassium 4.3; Sodium 137  Recent Lipid Panel Total cholesterol 160, triglycerides 72, HDL 31, calculated LDL 115, creatinine 0.96, sodium 137, potassium 5.0, hemoglobin 11.6, TSH 0.051, normal liver function tests (labs performed February 6 at [facility])  Physical Exam:    VS:  BP (!) 152/86   Pulse 66   Ht 5\' 6"  (1.676 m)   Wt 126 lb (57.2 kg)   BMI 20.34 kg/m     Wt Readings from Last 3 Encounters:  01/02/18 126 lb (57.2 kg)  11/25/17 135 lb 9.6 oz (61.5 kg)  11/24/17 141 lb (64 kg)      General: Alert, oriented x3, no distress, she appears very lean, borderline cachectic Head: no evidence of trauma, PERRL, EOMI, no exophtalmos or lid lag, no myxedema, no xanthelasma; normal ears, nose and oropharynx Neck: normal jugular venous pulsations and no hepatojugular reflux; brisk carotid pulses without delay and no carotid bruits Chest: clear to auscultation, no signs of consolidation by percussion or palpation, normal fremitus, symmetrical and full respiratory excursions Cardiovascular: normal position and quality of the apical impulse, regular rhythm, normal first and second heart sounds, no murmurs, rubs or gallops Abdomen: no tenderness or distention, no masses by palpation, no abnormal pulsatility or arterial bruits, normal bowel sounds, no hepatosplenomegaly Extremities: no clubbing, cyanosis or edema; 2+ radial, ulnar and brachial pulses bilaterally; 2+ right femoral, posterior tibial and dorsalis pedis pulses; 2+ left femoral, posterior tibial and dorsalis pedis pulses; no subclavian or femoral bruits Neurological: grossly nonfocal Psych: Normal mood and affect   ASSESSMENT:    1. Chronic combined systolic and diastolic congestive heart failure ([place])   2. Essential hypertension   3. Hypercholesterolemia   4. Hyperthyroidism   5. Mildly underweight adult    PLAN:     In order of problems listed above:  1. CHF: She now appears to be euvolemic.  NYHA functional class I-2.  Edema has resolved.  Weight is down 16 pounds since initial presentation.  Reaffirmed the need for daily weight monitoring, sodium restriction.  Asked her to call our office promptly if her weight reaches 130 pounds on her home scale.  At this point, the bulk of evidence suggest that she has nonischemic cardiomyopathy.  Could be due to insufficiently treated hypertension or possibly a history of anthracycline chemotherapy (difficult to confirm since breast cancer treatment happened approximately 20 years ago).  Even though she has some thyroid abnormalities, I doubt that she has thyrotoxic cardiomyopathy.  Will increase carvedilol to 6.25  mg twice daily today.  Plan to bring her back in a few weeks to increase the losartan.  Told her that the purpose of these medications is for heart failure, not just blood pressure treatment.  We will continue to increase the doses of ARB and beta-blocker steadily until reached a maximum tolerated amount. 2. HTN: Blood pressure remains high, continue to titrate ARB and beta-blocker. 3. HLP: Her LDL cholesterol is a little high, but in the absence of confirmed vascular disease specific additional therapy is not indicated.  She is clearly averse to taking any additional medicines. 4. Hyperthyroidism: Had suppressed TSH.  Clinically no signs of thyrotoxicosis, but this should be followed up with a full thyroid panel.  Thyroid ultrasound showed multiple nodules, many of which were felt to meet criteria for fine-needle aspiration. 5. Underweight: This is more likely related to thyroid abnormalities than it would be to cardiac cachexia.  Her heart failure is not that bad and has been relatively easy   Medication Adjustments/Labs and Tests Ordered: Current medicines are reviewed at length with the patient today.  Concerns regarding medicines are outlined above.  Orders  Placed This Encounter  Procedures  . ECHOCARDIOGRAM COMPLETE   Meds ordered this encounter  Medications  . carvedilol (COREG) 6.25 MG tablet    Sig: Take 1 tablet (6.25 mg total) by mouth 2 (two) times daily with a meal.    Dispense:  180 tablet    Refill:  3    Signed, Sanda Klein, MD  01/02/2018 2:13 PM    [facility]

## 2018-01-02 NOTE — Patient Instructions (Signed)
Dr [name] has recommended making the following medication changes: 1. INCREASE Carvedilol to 6.25 mg twice daily  Your physician has requested that you have an echocardiogram. Echocardiography is a painless test that uses sound waves to create images of your heart. It provides your doctor with information about the size and shape of your heart and how well your heart's chambers and valves are working. This procedure takes approximately one hour. There are no restrictions for this procedure. >> This will be performed at our [facility] location [place], [place] [address] [phone]  Your physician recommends that you weigh, daily, at the same time every day, and in the same amount of clothing. Please record your daily weights on the handout provided and bring it to your next appointment. Please call the office if your weight is over 130 pounds.  Your physician recommends that you schedule a follow-up appointment in 4 weeks with a NP/PA.  Dr [name] recommends that you schedule a follow-up appointment in 3 months.  If you need a refill on your cardiac medications before your next appointment, please call your pharmacy.

## 2018-01-12 ENCOUNTER — Other Ambulatory Visit: Payer: Self-pay

## 2018-01-12 ENCOUNTER — Ambulatory Visit (HOSPITAL_COMMUNITY): Payer: Medicare Other | Attending: Internal Medicine

## 2018-01-12 DIAGNOSIS — I340 Nonrheumatic mitral (valve) insufficiency: Secondary | ICD-10-CM | POA: Diagnosis not present

## 2018-01-12 DIAGNOSIS — I5042 Chronic combined systolic (congestive) and diastolic (congestive) heart failure: Secondary | ICD-10-CM | POA: Diagnosis not present

## 2018-01-12 DIAGNOSIS — I110 Hypertensive heart disease with heart failure: Secondary | ICD-10-CM | POA: Diagnosis not present

## 2018-01-16 ENCOUNTER — Other Ambulatory Visit (HOSPITAL_COMMUNITY): Payer: Self-pay | Admitting: Internal Medicine

## 2018-01-16 DIAGNOSIS — E0590 Thyrotoxicosis, unspecified without thyrotoxic crisis or storm: Secondary | ICD-10-CM

## 2018-01-23 ENCOUNTER — Telehealth: Payer: Self-pay | Admitting: Cardiovascular Disease

## 2018-01-23 NOTE — Telephone Encounter (Signed)
Returned the call to the patient's daughter, per the dpr. She stated that she was concerned that her mother's blood pressure was still elevated after the increase in the Carvedilol to 6.25 mg bid.  She states that her blood pressure yesterday morning was 160/97 and today it was 153/96. These readings were taken prior to her medications. She was asked to check her blood pressure while on the phone to see what it was after she had taken her medication. Her blood pressure was 119/66. The daughter will now take her blood pressure a few hours after the medication and keep a log of these. She will bring them to her next appointment. Scheduling has been notified to help her get this set up.

## 2018-01-23 NOTE — Telephone Encounter (Signed)
Pt c/o BP issue: STAT if pt c/o blurred vision, one-sided weakness or slurred speech  1. What are your last 5 BP readings?  153/96  160/97   142/80   133/74  2. Are you having any other symptoms (ex. Dizziness, headache, blurred vision, passed out)? no  3. What is your BP issue? Diastolic BP is not going down

## 2018-02-05 DIAGNOSIS — Z780 Asymptomatic menopausal state: Secondary | ICD-10-CM | POA: Diagnosis not present

## 2018-02-05 DIAGNOSIS — M8588 Other specified disorders of bone density and structure, other site: Secondary | ICD-10-CM | POA: Diagnosis not present

## 2018-02-09 ENCOUNTER — Encounter: Payer: Self-pay | Admitting: Physician Assistant

## 2018-02-09 ENCOUNTER — Ambulatory Visit (HOSPITAL_COMMUNITY): Payer: Medicare Other

## 2018-02-09 ENCOUNTER — Ambulatory Visit (INDEPENDENT_AMBULATORY_CARE_PROVIDER_SITE_OTHER): Payer: Medicare Other | Admitting: Physician Assistant

## 2018-02-09 VITALS — BP 121/81 | HR 67 | Ht 66.0 in | Wt 127.0 lb

## 2018-02-09 DIAGNOSIS — E041 Nontoxic single thyroid nodule: Secondary | ICD-10-CM | POA: Diagnosis not present

## 2018-02-09 DIAGNOSIS — I10 Essential (primary) hypertension: Secondary | ICD-10-CM

## 2018-02-09 DIAGNOSIS — E785 Hyperlipidemia, unspecified: Secondary | ICD-10-CM | POA: Diagnosis not present

## 2018-02-09 DIAGNOSIS — E042 Nontoxic multinodular goiter: Secondary | ICD-10-CM | POA: Diagnosis not present

## 2018-02-09 DIAGNOSIS — I519 Heart disease, unspecified: Secondary | ICD-10-CM | POA: Diagnosis not present

## 2018-02-09 DIAGNOSIS — H1045 Other chronic allergic conjunctivitis: Secondary | ICD-10-CM | POA: Diagnosis not present

## 2018-02-09 DIAGNOSIS — H2513 Age-related nuclear cataract, bilateral: Secondary | ICD-10-CM | POA: Diagnosis not present

## 2018-02-09 DIAGNOSIS — H25013 Cortical age-related cataract, bilateral: Secondary | ICD-10-CM | POA: Diagnosis not present

## 2018-02-09 DIAGNOSIS — E0590 Thyrotoxicosis, unspecified without thyrotoxic crisis or storm: Secondary | ICD-10-CM | POA: Diagnosis not present

## 2018-02-09 MED ORDER — LOSARTAN POTASSIUM 25 MG PO TABS: 25.0000 mg | ORAL_TABLET | Freq: Two times a day (BID) | ORAL | 6 refills | Status: DC

## 2018-02-09 NOTE — Progress Notes (Signed)
Cardiology Office Note    Date:  02/09/2018   ID:  Diane Ross, DOB August 15, 1931, MRN [id number]  PCP:  [name], NP  Cardiologist:  Dr. [name]   Chief Complaint  Patient presents with  . Follow-up    seen for Dr. [name], high blood pressure at home.    History of Present Illness:  Diane Ross is a 84 y.o. female with PMH of long standing h/o HTN, HLD, thyroid nodule, and recently diagnosed LV dysfunction.  Myoview obtained on 11/24/2017 showed EF 45%, mild diffuse hypokinesis, otherwise no ischemia or infarction.  This was felt to be nonischemic cardiomyopathy.  Ultrasound of the thyroid obtained on 11/26/2017 showed multiple thyroid nodule, fine-needle aspiration biopsy was recommended.  During the last office visit in March 2019, her blood pressure remain elevated in the 150s range, carvedilol was increased to 6.25 mg twice daily. Echocardiogram obtained on 01/12/2018 showed EF 50 to 55%, grade 1 DD, mild MR, moderately dilated left atrium.   Patient presents today along with her daughter for evaluation of uncontrolled high blood pressure.  Based on home blood pressure diary, her blood pressure has been ranging in the 150s to 160s every morning prior to blood pressure medication.  After the blood pressure medication, her blood pressure has been ranging between 110s to 130s and one time episode of 190s.  She denies any orthostatic dizziness.  She denies any chest pain or shortness of breath.  Although there was some mention of lower extremity swelling, I do not see any on physical exam.  Her lung is clear, there is no sign of heart failure on exam.  She is on moderate dose of diuretic since earlier this year, she is also on potassium supplement along with losartan.  I recommended basic metabolic panel, however she says she just had a lab work earlier this morning at Dr. Cindra Eves office.  We will request lab work, otherwise she has close outpatient visit with her PCP, I would recommend a  basic metabolic panel at that time   Past Medical History:  Diagnosis Date  . Breast cancer ([place])    left side  . Hypertension   . Thyroid disorder     Past Surgical History:  Procedure Laterality Date  . ABDOMINAL HYSTERECTOMY    . BREAST LUMPECTOMY Left     Current Medications: Outpatient Medications Prior to Visit  Medication Sig Dispense Refill  . aspirin EC 81 MG tablet Take 81 mg by mouth daily.    . carvedilol (COREG) 6.25 MG tablet Take 1 tablet (6.25 mg total) by mouth 2 (two) times daily with a meal. 180 tablet 3  . furosemide (LASIX) 40 MG tablet Take 40 mg by mouth daily.    . potassium chloride SA (K-DUR,KLOR-CON) 20 MEQ tablet Take 20 mEq by mouth daily.    Marland Kitchen losartan (COZAAR) 25 MG tablet Take 1 tablet (25 mg total) by mouth daily. 90 tablet 1   No facility-administered medications prior to visit.      Allergies:   Patient has no known allergies.   Social History   Socioeconomic History  . Marital status: Widowed    Spouse name: Not on file  . Number of children: Not on file  . Years of education: Not on file  . Highest education level: Not on file  Occupational History  . Occupation: retired  Scientific laboratory technician  . Financial resource strain: Not on file  . Food insecurity:    Worry: Not on file  Inability: Not on file  . Transportation needs:    Medical: Not on file    Non-medical: Not on file  Tobacco Use  . Smoking status: Never Smoker  . Smokeless tobacco: Never Used  Substance and Sexual Activity  . Alcohol use: No    Frequency: Never  . Drug use: No  . Sexual activity: Never  Lifestyle  . Physical activity:    Days per week: Not on file    Minutes per session: Not on file  . Stress: Not on file  Relationships  . Social connections:    Talks on phone: Not on file    Gets together: Not on file    Attends religious service: Not on file    Active member of club or organization: Not on file    Attends meetings of clubs or organizations:  Not on file    Relationship status: Not on file  Other Topics Concern  . Not on file  Social History Narrative  . Not on file     Family History:  The patient's family history is not on file.   ROS:   Please see the history of present illness.    ROS All other systems reviewed and are negative.   PHYSICAL EXAM:   VS:  BP 121/81 (BP Location: Left Arm, Patient Position: Sitting, Cuff Size: Normal)   Pulse 67   Ht 5\' 6"  (1.676 m)   Wt 127 lb (57.6 kg)   BMI 20.50 kg/m    GEN: Well nourished, well developed, in no acute distress  HEENT: normal  Neck: no JVD, carotid bruits, or masses Cardiac: RRR; no murmurs, rubs, or gallops,no edema  Respiratory:  clear to auscultation bilaterally, normal work of breathing GI: soft, nontender, nondistended, + BS MS: no deformity or atrophy  Skin: warm and dry, no rash Neuro:  Alert and Oriented x 3, Strength and sensation are intact Psych: euthymic mood, full affect  Wt Readings from Last 3 Encounters:  02/09/18 127 lb (57.6 kg)  01/02/18 126 lb (57.2 kg)  11/25/17 135 lb 9.6 oz (61.5 kg)      Studies/Labs Reviewed:   EKG:  EKG is not ordered today  Recent Labs: 11/19/2017: BUN 15; Creatinine, Ser 0.98; Hemoglobin 11.2; Platelets 244; Potassium 4.3; Sodium 137   Lipid Panel No results found for: CHOL, TRIG, HDL, CHOLHDL, VLDL, LDLCALC, LDLDIRECT  Additional studies/ records that were reviewed today include:   Myoview 11/24/2017 Study Highlights     Nuclear stress EF: 45%. Mild diffuse hypokinesis  There was no ST segment deviation noted during stress.  The study is normal. No perfusion defects identified. Dilated LV.  This is an intermediate risk study based upon mildly reduced left ventricular ejection fraction. Non-ischemic cardiomyopathy.    Echo 01/12/2018 LV EF: 50% -   55% Study Conclusions  - Left ventricle: Global longitudinal strain is -16.8% The cavity   size was normal. Wall thickness was increased in a  pattern of   mild LVH. Systolic function was normal. The estimated ejection   fraction was in the range of 50% to 55%. Doppler parameters are   consistent with abnormal left ventricular relaxation (grade 1   diastolic dysfunction). - Mitral valve: Moderately to severely calcified annulus. Mildly   thickened leaflets . There was mild regurgitation. - Left atrium: The atrium was moderately dilated.   ASSESSMENT:    1. LV dysfunction   2. Essential hypertension   3. Hyperlipidemia, unspecified hyperlipidemia type   4.  Thyroid nodule      PLAN:  In order of problems listed above:  1. LV dysfunction: Mildly February did not show any ischemia or infarction, felt to be nonischemic cardiomyopathy.  Repeat echocardiogram in April 2019 showed normalization of the ejection fraction.  She does not have any sign of heart failure on physical exam, however for her small body size, she is on 40 mg daily of Lasix.  I recommended basic metabolic panel today, however she says she just had lab work done at Dr. Cindra Eves office, it is unclear to me if it is just thyroid orders also involve basic metabolic panel.  She also mentions she has upcoming visit with her PCP as well.  I would recommend a basic metabolic panel at the latest by that time.  2. Hypertension: Blood pressure has been elevated at home, I will increase losartan to 25 mg twice daily.  I think previous high blood pressure is due to daily blood pressure fluctuation.  After increased on the losartan, I would recommend she monitor the blood pressure, if her systolic blood pressure dropped below 100 mmHg, she will contact cardiology, at that time I likely will discontinue the morning dose of losartan and keep losartan at every night since her blood pressure tend to high in the morning.  Otherwise her daughter will send me a list of her blood pressure through my chart in 1 to 2 weeks for review.  She is on both losartan and potassium supplement, I wanted  to do a basic metabolic panel however patient says she just had a lab work at Dr. Cindra Eves office this morning.  We will request the lab work a result.  3. Hyperlipidemia: She is on low-dose aspirin, question if needed based on the recent American Heart Association guidelines.  I will defer this decision to Dr. [name].  Her LDL in February 2019 was borderline high, will hold off on adding statins at this time.  4. Thyroid nodule: She has multiple thyroid nodules in February, according to the daughter, she has upcoming thyroid scan recommended by Dr. Buddy Duty.    Medication Adjustments/Labs and Tests Ordered: Current medicines are reviewed at length with the patient today.  Concerns regarding medicines are outlined above.  Medication changes, Labs and Tests ordered today are listed in the Patient Instructions below. Patient Instructions  Medication Instructions:  INCREASE Losartan to 25mg  take 1 tablet twice a day  Labwork: We will request labs from Dr Cindra Eves office  Testing/Procedures: None   Follow-Up: Your physician recommends that you schedule a follow-up appointment in: 3 months with Dr [name] ONLY.  Any Other Special Instructions Will Be Listed Below (If Applicable). Continue to check your blood pressure twice a day  If you need a refill on your cardiac medications before your next appointment, please call your pharmacy.     Hilbert Corrigan, [place]  02/09/2018 1:38 PM    [facility] [address], [place], [place]  [address] Phone: [phone]; Fax: [phone]

## 2018-02-09 NOTE — Patient Instructions (Addendum)
Medication Instructions:  INCREASE Losartan to 25mg  take 1 tablet twice a day  Labwork: We will request labs from Dr Cindra Eves office  Testing/Procedures: None   Follow-Up: Your physician recommends that you schedule a follow-up appointment in: 3 months with Dr [name] ONLY.  Any Other Special Instructions Will Be Listed Below (If Applicable). Continue to check your blood pressure twice a day  If you need a refill on your cardiac medications before your next appointment, please call your pharmacy.

## 2018-02-10 ENCOUNTER — Encounter (HOSPITAL_COMMUNITY): Payer: Medicare Other

## 2018-03-05 ENCOUNTER — Encounter (HOSPITAL_COMMUNITY)
Admission: RE | Admit: 2018-03-05 | Discharge: 2018-03-05 | Disposition: A | Discharge status: Home / Self Care | Payer: Medicare Other | Source: Ambulatory Visit | Attending: Internal Medicine | Admitting: Internal Medicine

## 2018-03-05 DIAGNOSIS — E0590 Thyrotoxicosis, unspecified without thyrotoxic crisis or storm: Secondary | ICD-10-CM | POA: Insufficient documentation

## 2018-03-05 MED ORDER — SODIUM IODIDE I-123 7.4 MBQ CAPS
300.0000 | ORAL_CAPSULE | Freq: Once | ORAL | Status: AC
  Administered 2018-03-05: 400 via ORAL

## 2018-03-06 ENCOUNTER — Encounter (HOSPITAL_COMMUNITY)
Admission: RE | Admit: 2018-03-06 | Discharge: 2018-03-06 | Disposition: A | Discharge status: Home / Self Care | Payer: Medicare Other | Source: Ambulatory Visit | Attending: Internal Medicine | Admitting: Internal Medicine

## 2018-03-06 DIAGNOSIS — Z1231 Encounter for screening mammogram for malignant neoplasm of breast: Secondary | ICD-10-CM | POA: Diagnosis not present

## 2018-03-06 DIAGNOSIS — Z13820 Encounter for screening for osteoporosis: Secondary | ICD-10-CM | POA: Diagnosis not present

## 2018-03-06 DIAGNOSIS — I480 Paroxysmal atrial fibrillation: Secondary | ICD-10-CM | POA: Diagnosis not present

## 2018-03-06 DIAGNOSIS — I5022 Chronic systolic (congestive) heart failure: Secondary | ICD-10-CM | POA: Diagnosis not present

## 2018-03-06 DIAGNOSIS — E213 Hyperparathyroidism, unspecified: Secondary | ICD-10-CM | POA: Diagnosis not present

## 2018-03-06 DIAGNOSIS — E0590 Thyrotoxicosis, unspecified without thyrotoxic crisis or storm: Secondary | ICD-10-CM | POA: Diagnosis not present

## 2018-03-28 ENCOUNTER — Inpatient Hospital Stay (HOSPITAL_COMMUNITY)
Admission: EM | Admit: 2018-03-28 | Discharge: 2018-03-31 | DRG: 378 | Disposition: A | Discharge status: Home / Self Care | Payer: Medicare Other | Attending: Family Medicine | Admitting: Family Medicine

## 2018-03-28 ENCOUNTER — Other Ambulatory Visit: Payer: Self-pay

## 2018-03-28 ENCOUNTER — Encounter (HOSPITAL_COMMUNITY): Payer: Self-pay

## 2018-03-28 DIAGNOSIS — R55 Syncope and collapse: Secondary | ICD-10-CM | POA: Diagnosis not present

## 2018-03-28 DIAGNOSIS — C50919 Malignant neoplasm of unspecified site of unspecified female breast: Secondary | ICD-10-CM | POA: Insufficient documentation

## 2018-03-28 DIAGNOSIS — R1033 Periumbilical pain: Secondary | ICD-10-CM | POA: Diagnosis not present

## 2018-03-28 DIAGNOSIS — R404 Transient alteration of awareness: Secondary | ICD-10-CM | POA: Diagnosis not present

## 2018-03-28 DIAGNOSIS — R159 Full incontinence of feces: Secondary | ICD-10-CM | POA: Diagnosis present

## 2018-03-28 DIAGNOSIS — K2991 Gastroduodenitis, unspecified, with bleeding: Secondary | ICD-10-CM | POA: Diagnosis not present

## 2018-03-28 DIAGNOSIS — K269 Duodenal ulcer, unspecified as acute or chronic, without hemorrhage or perforation: Secondary | ICD-10-CM

## 2018-03-28 DIAGNOSIS — K922 Gastrointestinal hemorrhage, unspecified: Secondary | ICD-10-CM

## 2018-03-28 DIAGNOSIS — K264 Chronic or unspecified duodenal ulcer with hemorrhage: Secondary | ICD-10-CM | POA: Diagnosis not present

## 2018-03-28 DIAGNOSIS — I959 Hypotension, unspecified: Secondary | ICD-10-CM | POA: Diagnosis not present

## 2018-03-28 DIAGNOSIS — I429 Cardiomyopathy, unspecified: Secondary | ICD-10-CM | POA: Diagnosis present

## 2018-03-28 DIAGNOSIS — D649 Anemia, unspecified: Secondary | ICD-10-CM | POA: Diagnosis not present

## 2018-03-28 DIAGNOSIS — R195 Other fecal abnormalities: Secondary | ICD-10-CM | POA: Diagnosis not present

## 2018-03-28 DIAGNOSIS — R42 Dizziness and giddiness: Secondary | ICD-10-CM | POA: Diagnosis not present

## 2018-03-28 DIAGNOSIS — I5042 Chronic combined systolic (congestive) and diastolic (congestive) heart failure: Secondary | ICD-10-CM | POA: Diagnosis present

## 2018-03-28 DIAGNOSIS — E0590 Thyrotoxicosis, unspecified without thyrotoxic crisis or storm: Secondary | ICD-10-CM | POA: Diagnosis present

## 2018-03-28 DIAGNOSIS — E785 Hyperlipidemia, unspecified: Secondary | ICD-10-CM | POA: Diagnosis present

## 2018-03-28 DIAGNOSIS — D62 Acute posthemorrhagic anemia: Secondary | ICD-10-CM | POA: Diagnosis present

## 2018-03-28 DIAGNOSIS — Z79899 Other long term (current) drug therapy: Secondary | ICD-10-CM

## 2018-03-28 DIAGNOSIS — E440 Moderate protein-calorie malnutrition: Secondary | ICD-10-CM | POA: Diagnosis not present

## 2018-03-28 DIAGNOSIS — D509 Iron deficiency anemia, unspecified: Secondary | ICD-10-CM | POA: Diagnosis not present

## 2018-03-28 DIAGNOSIS — E039 Hypothyroidism, unspecified: Secondary | ICD-10-CM | POA: Diagnosis present

## 2018-03-28 DIAGNOSIS — I110 Hypertensive heart disease with heart failure: Secondary | ICD-10-CM | POA: Diagnosis present

## 2018-03-28 DIAGNOSIS — I10 Essential (primary) hypertension: Secondary | ICD-10-CM | POA: Diagnosis present

## 2018-03-28 DIAGNOSIS — Z853 Personal history of malignant neoplasm of breast: Secondary | ICD-10-CM

## 2018-03-28 DIAGNOSIS — N179 Acute kidney failure, unspecified: Secondary | ICD-10-CM | POA: Diagnosis present

## 2018-03-28 DIAGNOSIS — Z7982 Long term (current) use of aspirin: Secondary | ICD-10-CM

## 2018-03-28 DIAGNOSIS — E7800 Pure hypercholesterolemia, unspecified: Secondary | ICD-10-CM | POA: Diagnosis present

## 2018-03-28 DIAGNOSIS — Z6826 Body mass index (BMI) 26.0-26.9, adult: Secondary | ICD-10-CM

## 2018-03-28 DIAGNOSIS — R112 Nausea with vomiting, unspecified: Secondary | ICD-10-CM | POA: Diagnosis not present

## 2018-03-28 LAB — SURGICAL PCR SCREEN
MRSA, PCR: NEGATIVE
Staphylococcus aureus: POSITIVE — AB

## 2018-03-28 LAB — CBC
HCT: 24.9 % — ABNORMAL LOW (ref 36.0–46.0)
Hemoglobin: 7.8 g/dL — ABNORMAL LOW (ref 12.0–15.0)
MCH: 28.9 pg (ref 26.0–34.0)
MCHC: 31.3 g/dL (ref 30.0–36.0)
MCV: 92.2 fL (ref 78.0–100.0)
Platelets: 201 10*3/uL (ref 150–400)
RBC: 2.7 MIL/uL — ABNORMAL LOW (ref 3.87–5.11)
RDW: 14.6 % (ref 11.5–15.5)
WBC: 7.7 10*3/uL (ref 4.0–10.5)

## 2018-03-28 LAB — APTT: aPTT: 28 seconds (ref 24–36)

## 2018-03-28 LAB — HEMOGLOBIN
Hemoglobin: 9.3 g/dL — ABNORMAL LOW (ref 12.0–15.0)
Hemoglobin: 9 g/dL — ABNORMAL LOW (ref 12.0–15.0)

## 2018-03-28 LAB — COMPREHENSIVE METABOLIC PANEL
ALT: 10 U/L — ABNORMAL LOW (ref 14–54)
AST: 15 U/L (ref 15–41)
Albumin: 3.1 g/dL — ABNORMAL LOW (ref 3.5–5.0)
Alkaline Phosphatase: 49 U/L (ref 38–126)
Anion gap: 4 — ABNORMAL LOW (ref 5–15)
BUN: 54 mg/dL — ABNORMAL HIGH (ref 6–20)
CO2: 30 mmol/L (ref 22–32)
Calcium: 9.1 mg/dL (ref 8.9–10.3)
Chloride: 106 mmol/L (ref 101–111)
Creatinine, Ser: 1.13 mg/dL — ABNORMAL HIGH (ref 0.44–1.00)
GFR calc Af Amer: 50 mL/min — ABNORMAL LOW (ref >60)
GFR calc non Af Amer: 43 mL/min — ABNORMAL LOW (ref >60)
Glucose, Bld: 112 mg/dL — ABNORMAL HIGH (ref 65–99)
Potassium: 4.8 mmol/L (ref 3.5–5.1)
Sodium: 140 mmol/L (ref 135–145)
Total Bilirubin: 0.4 mg/dL (ref 0.3–1.2)
Total Protein: 6.2 g/dL — ABNORMAL LOW (ref 6.5–8.1)

## 2018-03-28 LAB — URINALYSIS, ROUTINE W REFLEX MICROSCOPIC
Bacteria, UA: NONE SEEN
Bilirubin Urine: NEGATIVE
Glucose, UA: NEGATIVE mg/dL
Ketones, ur: NEGATIVE mg/dL
Nitrite: NEGATIVE
Protein, ur: NEGATIVE mg/dL
Specific Gravity, Urine: 1.009 (ref 1.005–1.030)
pH: 5 (ref 5.0–8.0)

## 2018-03-28 LAB — CBG MONITORING, ED: Glucose-Capillary: 85 mg/dL (ref 65–99)

## 2018-03-28 LAB — SAMPLE TO BLOOD BANK

## 2018-03-28 LAB — PROTIME-INR
INR: 1.08
Prothrombin Time: 13.9 seconds (ref 11.4–15.2)

## 2018-03-28 LAB — I-STAT TROPONIN, ED: Troponin i, poc: 0.02 ng/mL (ref 0.00–0.08)

## 2018-03-28 LAB — ABO/RH: ABO/RH(D): O POS

## 2018-03-28 LAB — TSH: TSH: 0.064 u[IU]/mL — ABNORMAL LOW (ref 0.350–4.500)

## 2018-03-28 LAB — PREPARE RBC (CROSSMATCH)

## 2018-03-28 LAB — POC OCCULT BLOOD, ED: Fecal Occult Bld: POSITIVE — AB

## 2018-03-28 MED ORDER — ONDANSETRON HCL 4 MG/2ML IJ SOLN: 4.0000 mg | Freq: Four times a day (QID) | INTRAMUSCULAR | Status: DC | PRN

## 2018-03-28 MED ORDER — BISACODYL 10 MG RE SUPP: 10.0000 mg | Freq: Every day | RECTAL | Status: DC | PRN

## 2018-03-28 MED ORDER — SENNOSIDES-DOCUSATE SODIUM 8.6-50 MG PO TABS: 1.0000 | ORAL_TABLET | Freq: Every evening | ORAL | Status: DC | PRN

## 2018-03-28 MED ORDER — SODIUM CHLORIDE 0.45 % IV SOLN
INTRAVENOUS | Status: DC
  Administered 2018-03-28 – 2018-03-29 (×2): via INTRAVENOUS

## 2018-03-28 MED ORDER — SODIUM CHLORIDE 0.9 % IV SOLN
INTRAVENOUS | Status: DC
  Administered 2018-03-28: 16:00:00 via INTRAVENOUS

## 2018-03-28 MED ORDER — ACETAMINOPHEN 650 MG RE SUPP: 650.0000 mg | Freq: Four times a day (QID) | RECTAL | Status: DC | PRN

## 2018-03-28 MED ORDER — ONDANSETRON HCL 4 MG PO TABS: 4.0000 mg | ORAL_TABLET | Freq: Four times a day (QID) | ORAL | Status: DC | PRN

## 2018-03-28 MED ORDER — SODIUM CHLORIDE 0.9 % IV SOLN: INTRAVENOUS | Status: DC

## 2018-03-28 MED ORDER — ACETAMINOPHEN 325 MG PO TABS: 650.0000 mg | ORAL_TABLET | Freq: Four times a day (QID) | ORAL | Status: DC | PRN

## 2018-03-28 MED ORDER — SODIUM CHLORIDE 0.9 % IV BOLUS
500.0000 mL | Freq: Once | INTRAVENOUS | Status: AC
  Administered 2018-03-28: 500 mL via INTRAVENOUS

## 2018-03-28 MED ORDER — MUPIROCIN 2 % EX OINT
1.0000 "application " | TOPICAL_OINTMENT | Freq: Two times a day (BID) | CUTANEOUS | Status: AC
  Administered 2018-03-29 – 2018-03-30 (×4): 1 via NASAL
  Filled 2018-03-28 (×4): qty 22

## 2018-03-28 MED ORDER — PANTOPRAZOLE SODIUM 40 MG IV SOLR
40.0000 mg | Freq: Once | INTRAVENOUS | Status: AC
  Administered 2018-03-28: 40 mg via INTRAVENOUS
  Filled 2018-03-28 (×2): qty 40

## 2018-03-28 MED ORDER — HYDROCODONE-ACETAMINOPHEN 5-325 MG PO TABS: 1.0000 | ORAL_TABLET | ORAL | Status: DC | PRN

## 2018-03-28 NOTE — H&P (View-Only) (Signed)
Reason for Consult: Melena, anemia, and near syncope Referring Physician: [facility]  Diane Ross HPI: This is an 84 year old female with a PMH of breast cancer and HTN admitted for a near syncopal episode.  Over this past month the patient has noted dark to black stools 1-2 times per day, but she did not inform her PCP or her daughter.  She denies any NSAID use, but she reports a mild periumbilical pain.  There is no history of PUD or GERD.  Her HGB on 11/19/2017 was at 11.2 g/dL and her admission HGB was at 7.8 g/dL.  The patient reports having a normal colonoscopy 5 years ago for routine purposes when she was living in [place], [place].  Past Medical History:  Diagnosis Date  . Breast cancer ([place])    left side  . Hypertension   . Thyroid disorder     Past Surgical History:  Procedure Laterality Date  . ABDOMINAL HYSTERECTOMY    . BREAST LUMPECTOMY Left     No family history on file.  Social History:  reports that she has never smoked. She has never used smokeless tobacco. She reports that she does not drink alcohol or use drugs.  Allergies: No Known Allergies  Medications:  Scheduled:  Continuous: . sodium chloride      Results for orders placed or performed during the hospital encounter of 03/28/18 (from the past 24 hour(s))  CBC     Status: Abnormal   Collection Time: 03/28/18 10:17 AM  Result Value Ref Range   WBC 7.7 4.0 - 10.5 K/uL   RBC 2.70 (L) 3.87 - 5.11 MIL/uL   Hemoglobin 7.8 (L) 12.0 - 15.0 g/dL   HCT 24.9 (L) 36.0 - 46.0 %   MCV 92.2 78.0 - 100.0 fL   MCH 28.9 26.0 - 34.0 pg   MCHC 31.3 30.0 - 36.0 g/dL   RDW 14.6 11.5 - 15.5 %   Platelets 201 150 - 400 K/uL  Comprehensive metabolic panel     Status: Abnormal   Collection Time: 03/28/18 10:17 AM  Result Value Ref Range   Sodium 140 135 - 145 mmol/L   Potassium 4.8 3.5 - 5.1 mmol/L   Chloride 106 101 - 111 mmol/L   CO2 30 22 - 32 mmol/L   Glucose, Bld 112 (H) 65 - 99 mg/dL   BUN 54 (H) 6 - 20  mg/dL   Creatinine, Ser 1.13 (H) 0.44 - 1.00 mg/dL   Calcium 9.1 8.9 - 10.3 mg/dL   Total Protein 6.2 (L) 6.5 - 8.1 g/dL   Albumin 3.1 (L) 3.5 - 5.0 g/dL   AST 15 15 - 41 U/L   ALT 10 (L) 14 - 54 U/L   Alkaline Phosphatase 49 38 - 126 U/L   Total Bilirubin 0.4 0.3 - 1.2 mg/dL   GFR calc non Af Amer 43 (L) >60 mL/min   GFR calc Af Amer 50 (L) >60 mL/min   Anion gap 4 (L) 5 - 15  I-Stat Troponin, ED (not at [facility])     Status: None   Collection Time: 03/28/18 10:23 AM  Result Value Ref Range   Troponin i, poc 0.02 0.00 - 0.08 ng/mL   Comment 3          Sample to Blood Bank     Status: None   Collection Time: 03/28/18 10:30 AM  Result Value Ref Range   Blood Bank Specimen SAMPLE AVAILABLE FOR TESTING    Sample Expiration  03/29/2018 Performed at [facility] [address], [place], [place] [address]   Type and screen [place]     Status: None (Preliminary result)   Collection Time: 03/28/18 10:30 AM  Result Value Ref Range   ABO/RH(D) O POS    Antibody Screen NEG    Sample Expiration 03/31/2018    Unit Number [id number]    Blood Component Type RED CELLS,LR    Unit division 00    Status of Unit ISSUED    Transfusion Status OK TO TRANSFUSE    Crossmatch Result      Compatible Performed at [facility], [place] [address] [place], [place] [address]   ABO/Rh     Status: None   Collection Time: 03/28/18 10:30 AM  Result Value Ref Range   ABO/RH(D)      O POS Performed at [facility] [address], [place], [place] [address]   CBG monitoring, ED     Status: None   Collection Time: 03/28/18 10:32 AM  Result Value Ref Range   Glucose-Capillary 85 65 - 99 mg/dL  POC occult blood, ED     Status: Abnormal   Collection Time: 03/28/18 10:58 AM  Result Value Ref Range   Fecal Occult Bld POSITIVE (A) NEGATIVE  Prepare RBC     Status: None   Collection Time: 03/28/18 11:27 AM  Result Value Ref Range   Order Confirmation      ORDER  PROCESSED BY BLOOD BANK Performed at [facility], [place] [address] [place], [place] [address]      No results found.  ROS:  As stated above in the HPI otherwise negative.  Blood pressure (!) 151/75, pulse 74, temperature 97.8 F (36.6 C), resp. rate 20, height 5' (1.524 m), weight 59 kg (130 lb), SpO2 100 %.    PE: Gen: NAD, Alert and Oriented HEENT:  [place]/AT, EOMI Neck: Supple, no LAD Lungs: CTA Bilaterally CV: RRR without M/G/R ABM: Soft, mild periumbilical pain, +BS Ext: No C/C/E  Assessment/Plan: 1) Anemia. 2) Heme positive stool. 3) Near syncope.   Her clinical presentation is consistent with an upper GI source.  An EGD will be performed for further evaluation.  If it is negative, a colonoscopy will be scheduled.  Plan: 1) EGD tomorrow.  Cj Edgell D 03/28/2018, 1:25 PM

## 2018-03-28 NOTE — Consult Note (Signed)
Reason for Consult: Melena, anemia, and near syncope Referring Physician: [facility]  Diane Ross HPI: This is an 84 year old female with a PMH of breast cancer and HTN admitted for a near syncopal episode.  Over this past month the patient has noted dark to black stools 1-2 times per day, but she did not inform Diane Ross PCP or Diane Ross daughter.  She denies any NSAID use, but she reports a mild periumbilical pain.  There is no history of PUD or GERD.  Diane Ross HGB on 11/19/2017 was at 11.2 g/dL and Diane Ross admission HGB was at 7.8 g/dL.  The patient reports having a normal colonoscopy 5 years ago for routine purposes when she was living in [place], [place].  Past Medical History:  Diagnosis Date  . Breast cancer ([place])    left side  . Hypertension   . Thyroid disorder     Past Surgical History:  Procedure Laterality Date  . ABDOMINAL HYSTERECTOMY    . BREAST LUMPECTOMY Left     No family history on file.  Social History:  reports that she has never smoked. She has never used smokeless tobacco. She reports that she does not drink alcohol or use drugs.  Allergies: No Known Allergies  Medications:  Scheduled:  Continuous: . sodium chloride      Results for orders placed or performed during the hospital encounter of 03/28/18 (from the past 24 hour(s))  CBC     Status: Abnormal   Collection Time: 03/28/18 10:17 AM  Result Value Ref Range   WBC 7.7 4.0 - 10.5 K/uL   RBC 2.70 (L) 3.87 - 5.11 MIL/uL   Hemoglobin 7.8 (L) 12.0 - 15.0 g/dL   HCT 24.9 (L) 36.0 - 46.0 %   MCV 92.2 78.0 - 100.0 fL   MCH 28.9 26.0 - 34.0 pg   MCHC 31.3 30.0 - 36.0 g/dL   RDW 14.6 11.5 - 15.5 %   Platelets 201 150 - 400 K/uL  Comprehensive metabolic panel     Status: Abnormal   Collection Time: 03/28/18 10:17 AM  Result Value Ref Range   Sodium 140 135 - 145 mmol/L   Potassium 4.8 3.5 - 5.1 mmol/L   Chloride 106 101 - 111 mmol/L   CO2 30 22 - 32 mmol/L   Glucose, Bld 112 (H) 65 - 99 mg/dL   BUN 54 (H) 6 - 20  mg/dL   Creatinine, Ser 1.13 (H) 0.44 - 1.00 mg/dL   Calcium 9.1 8.9 - 10.3 mg/dL   Total Protein 6.2 (L) 6.5 - 8.1 g/dL   Albumin 3.1 (L) 3.5 - 5.0 g/dL   AST 15 15 - 41 U/L   ALT 10 (L) 14 - 54 U/L   Alkaline Phosphatase 49 38 - 126 U/L   Total Bilirubin 0.4 0.3 - 1.2 mg/dL   GFR calc non Af Amer 43 (L) >60 mL/min   GFR calc Af Amer 50 (L) >60 mL/min   Anion gap 4 (L) 5 - 15  I-Stat Troponin, ED (not at [facility])     Status: None   Collection Time: 03/28/18 10:23 AM  Result Value Ref Range   Troponin i, poc 0.02 0.00 - 0.08 ng/mL   Comment 3          Sample to Blood Bank     Status: None   Collection Time: 03/28/18 10:30 AM  Result Value Ref Range   Blood Bank Specimen SAMPLE AVAILABLE FOR TESTING    Sample Expiration  03/29/2018 Performed at [facility] [address], [place], [place] [address]   Type and screen [place]     Status: None (Preliminary result)   Collection Time: 03/28/18 10:30 AM  Result Value Ref Range   ABO/RH(D) O POS    Antibody Screen NEG    Sample Expiration 03/31/2018    Unit Number [id number]    Blood Component Type RED CELLS,LR    Unit division 00    Status of Unit ISSUED    Transfusion Status OK TO TRANSFUSE    Crossmatch Result      Compatible Performed at [facility], [place] [address], [place], [place] [address]   ABO/Rh     Status: None   Collection Time: 03/28/18 10:30 AM  Result Value Ref Range   ABO/RH(D)      O POS Performed at [facility] [address] [place], [place] [address]   CBG monitoring, ED     Status: None   Collection Time: 03/28/18 10:32 AM  Result Value Ref Range   Glucose-Capillary 85 65 - 99 mg/dL  POC occult blood, ED     Status: Abnormal   Collection Time: 03/28/18 10:58 AM  Result Value Ref Range   Fecal Occult Bld POSITIVE (A) NEGATIVE  Prepare RBC     Status: None   Collection Time: 03/28/18 11:27 AM  Result Value Ref Range   Order Confirmation      ORDER  PROCESSED BY BLOOD BANK Performed at [facility], [place] [address], [place], [place] [address]      No results found.  ROS:  As stated above in the HPI otherwise negative.  Blood pressure (!) 151/75, pulse 74, temperature 97.8 F (36.6 C), resp. rate 20, height 5' (1.524 m), weight 59 kg (130 lb), SpO2 100 %.    PE: Gen: NAD, Alert and Oriented HEENT:  [place]/AT, EOMI Neck: Supple, no LAD Lungs: CTA Bilaterally CV: RRR without M/G/R ABM: Soft, mild periumbilical pain, +BS Ext: No C/C/E  Assessment/Plan: 1) Anemia. 2) Heme positive stool. 3) Near syncope.   Diane Ross clinical presentation is consistent with an upper GI source.  An EGD will be performed for further evaluation.  If it is negative, a colonoscopy will be scheduled.  Plan: 1) EGD tomorrow.  Markell Schrier D 03/28/2018, 1:25 PM

## 2018-03-28 NOTE — H&P (Addendum)
History and Physical    Diane Ross EHU:[id number] DOB: 09-May-1931 DOA: 03/28/2018   PCP: [name], NP   Patient coming from:  Home    Chief Complaint: Near syncope  HPI: Diane Ross is a 84 y.o. female with medical history significant for hypertension, hypothyroidism, hyperlipidemia, remote history of left breast cancer, status post left lumpectomy, brought to the ED for evaluation of a near syncopal episode.  The patient woke up feeling normal, took her blood pressure medications as usual, went back to her bed, but her daughter, weakness that the patient was very weak, and almost lost consciousness.  At the time, she was reported being diaphoretic.  She does not remember feeling dizzy or lightheaded.  She denies any chest pain or palpitations at the time.  She denies any shortness of breath or cough.  She denies any history of similar symptoms.  The patient is slightly incontinent of stools, and when she looked at then, they appeared dark.  Of note, the patient reports that this is been going on for about 1 month, but failed to report to her PCP or to her daughter about this.  The patient does remember the episode well.  No head trauma.  Her daughter called EMS for further evaluation.  She denies any abdominal pain, nausea or vomiting.  She denies any recent infections.  She denies any lower extremity swelling or calf pain.  No history of PE or DVT.  No recent hospitalizations, or surgeries.  No recent long distance trips.  No recent food poisoning.  She denies any history of severe anemia GI bleed, and she denies pica.  Denies any family history of GI cancers.  Her last colonoscopy was about 5 years ago, at which time she was told it was normal.  No tobacco, alcohol or recreational drug use.   ED Course:  BP 127/60   Pulse 70   Temp 97.8 F (36.6 C) (Oral)   Resp 19   Ht 5' (1.524 m)   Wt 59 kg (130 lb)   SpO2 100%   BMI 25.39 kg/m   Last Myoview 11/24/2017 showed EF 45%, mild  diffuse hypokinesis, but no ischemia or infarction.  Felt to be nonischemic cardiomyopathy. Last echo in 01/13/2008 shows EF 50 to 78%, grade 1 diastolic, mild MR, moderately dilated left atrium Sodium 140, potassium 4.8, bicarb 30, glucose 112, BUN 54, creatinine 1.13, anion gap 4, alkaline phosphatase 49, albumin 3.1, AST 15, ALT 10, total protein 6.4.  Total bilirubin 0.4. Troponin 0.02 White count 7.7, hemoglobin 7.8, platelets 201. Type and screen, and the patient is to receive 1 unit of blood. EKG shows sinus rhythm Probable left ventricular hypertrophy Nonspecific T abnormalities, lateral leads No significant change since last tracing    Review of Systems:  As per HPI otherwise all other systems reviewed and are negative  Past Medical History:  Diagnosis Date  . Breast cancer ([place])    left side  . Hypertension   . Thyroid disorder     Past Surgical History:  Procedure Laterality Date  . ABDOMINAL HYSTERECTOMY    . BREAST LUMPECTOMY Left     Social History Social History   Socioeconomic History  . Marital status: Widowed    Spouse name: Not on file  . Number of children: Not on file  . Years of education: Not on file  . Highest education level: Not on file  Occupational History  . Occupation: retired  Scientific laboratory technician  . Financial resource strain:  Not on file  . Food insecurity:    Worry: Not on file    Inability: Not on file  . Transportation needs:    Medical: Not on file    Non-medical: Not on file  Tobacco Use  . Smoking status: Never Smoker  . Smokeless tobacco: Never Used  Substance and Sexual Activity  . Alcohol use: No    Frequency: Never  . Drug use: No  . Sexual activity: Never  Lifestyle  . Physical activity:    Days per week: Not on file    Minutes per session: Not on file  . Stress: Not on file  Relationships  . Social connections:    Talks on phone: Not on file    Gets together: Not on file    Attends religious service: Not on file     Active member of club or organization: Not on file    Attends meetings of clubs or organizations: Not on file    Relationship status: Not on file  . Intimate partner violence:    Fear of current or ex partner: Not on file    Emotionally abused: Not on file    Physically abused: Not on file    Forced sexual activity: Not on file  Other Topics Concern  . Not on file  Social History Narrative  . Not on file     No Known Allergies  No family history on file.     Prior to Admission medications   Medication Sig Start Date End Date Taking? Authorizing Provider  aspirin EC 81 MG tablet Take 81 mg by mouth daily.    [provider]  carvedilol (COREG) 6.25 MG tablet Take 1 tablet (6.25 mg total) by mouth 2 (two) times daily with a meal. 01/02/18   Croitoru, Mihai, MD  furosemide (LASIX) 40 MG tablet Take 40 mg by mouth daily.    [provider]  losartan (COZAAR) 25 MG tablet Take 1 tablet (25 mg total) by mouth 2 (two) times daily. 02/09/18 09/07/18  Almyra Deforest, PA  potassium chloride SA (K-DUR,KLOR-CON) 20 MEQ tablet Take 20 mEq by mouth daily.    [provider]     Physical Exam:  Vitals:   03/28/18 0953 03/28/18 0954 03/28/18 1046 03/28/18 1100  BP: (!) 103/49  (!) 127/58 127/60  Pulse: (!) 34  70   Resp: 15  18 19   Temp: 97.8 F (36.6 C)  97.8 F (36.6 C)   TempSrc: Oral  Oral   SpO2: 98%  100%   Weight:  59 kg (130 lb)    Height:  5' (1.524 m)     Constitutional: NAD, calm, comfortable Eyes: PERRL, lids and conjunctivae normal ENMT: Mucous membranes are moist, without exudate or lesions  Neck: normal, supple, no masses, no palpable thyroid, non tender Respiratory: clear to auscultation bilaterally, no wheezing, no crackles. Normal respiratory effort  Cardiovascular: Regular rate and rhythm,  murmur, rubs or gallops. No extremity edema. 2+ pedal pulses. No carotid bruits.  Abdomen: Soft, non tender, No hepatosplenomegaly. Bowel sounds positive.    Musculoskeletal: no clubbing / cyanosis. Moves all extremities Skin: no jaundice, No lesions.  Neurologic: Sensation intact  Strength equal in all extremities Psychiatric:   Alert and oriented x 3. Normal mood.     Labs on Admission: I have personally reviewed following labs and imaging studies  CBC: Recent Labs  Lab 03/28/18 1017  WBC 7.7  HGB 7.8*  HCT 24.9*  MCV 92.2  PLT 341    Basic Metabolic Panel: Recent Labs  Lab 03/28/18 1017  NA 140  K 4.8  CL 106  CO2 30  GLUCOSE 112*  BUN 54*  CREATININE 1.13*  CALCIUM 9.1    GFR: Estimated Creatinine Clearance: 29.8 mL/min (A) (by C-G formula based on SCr of 1.13 mg/dL (H)).  Liver Function Tests: Recent Labs  Lab 03/28/18 1017  AST 15  ALT 10*  ALKPHOS 49  BILITOT 0.4  PROT 6.2*  ALBUMIN 3.1*   No results for input(s): LIPASE, AMYLASE in the last 168 hours. No results for input(s): AMMONIA in the last 168 hours.  Coagulation Profile: No results for input(s): INR, PROTIME in the last 168 hours.  Cardiac Enzymes: No results for input(s): CKTOTAL, CKMB, CKMBINDEX, TROPONINI in the last 168 hours.  BNP (last 3 results) No results for input(s): PROBNP in the last 8760 hours.  HbA1C: No results for input(s): HGBA1C in the last 72 hours.  CBG: Recent Labs  Lab 03/28/18 1032  GLUCAP 85    Lipid Profile: No results for input(s): CHOL, HDL, LDLCALC, TRIG, CHOLHDL, LDLDIRECT in the last 72 hours.  Thyroid Function Tests: No results for input(s): TSH, T4TOTAL, FREET4, T3FREE, THYROIDAB in the last 72 hours.  Anemia Panel: No results for input(s): VITAMINB12, FOLATE, FERRITIN, TIBC, IRON, RETICCTPCT in the last 72 hours.  Urine analysis: No results found for: COLORURINE, APPEARANCEUR, LABSPEC, PHURINE, GLUCOSEU, HGBUR, BILIRUBINUR, KETONESUR, PROTEINUR, UROBILINOGEN, NITRITE, LEUKOCYTESUR  Sepsis Labs: @LABRCNTIP (procalcitonin:4,lacticidven:4) )No results found for this or any previous visit (from  the past 240 hour(s)).   Radiological Exams on Admission: No results found.  EKG: Independently reviewed.  Assessment/Plan Principal Problem:   GIB (gastrointestinal bleeding) Active Problems:   Near syncope   Chronic combined systolic and diastolic congestive heart failure (HCC)   Essential hypertension   Hypercholesterolemia   Hyperthyroidism   Breast cancer ([place])    Near syncopal episode, felt to be secondary to symptomatic anemia of GI bleed.  Hemoglobin was 7.8 on presentation with a high BUN of 54, and in February of this year was 11.2.  Patient noted dark stools for about 1 month.  She is on aspirin on a daily basis.  She denies any abdominal pain nausea or vomiting.  No diarrhea is noted.  Chest pain or palpitations.  She is not orthostatic.  Presyncope does not appear to be vasovagal.  EKG and troponin unremarkable on presentation, she received IV Protonix, and she was type and screen, and to received 1 unit of blood. Telemetry observation  transfuse for Hgb less than 8.   IV protonix. IVF  Hold BP meds and aspirin NPO until seen by GI, if no procedure, will provide liquid diet Appreciate GI evaluation Hold other aspirin-NSAIDs Check H pylori  Fall precautions Will not repeat echo, as this was done recently. Neurochecks  Hypertension BP 127/60   Pulse 70 No orthostasis on admission  hold home anti-hypertensive medications    Hyperlipidemia Continue home statins   Hyperthyroidism: This is being followed by endocrinology. No recent TSH, will obtain any value    Chronic combined systolic and diastolic CHF Last Myoview 11/24/2017 showed EF 45%, mild diffuse hypokinesis, but no ischemia or infarction.  Felt to be nonischemic cardiomyopathy. Last echo in 01/13/2008 shows EF 50 to 96%, grade 1 diastolic, mild MR, moderately dilated left atrium. Weight 130 lbs  telemetry Hold meds  monitor I/Os and daily weights prn 02     DVT prophylaxis:  SCD  Code Status:     Full  Family Communication:  Discussed with patient Disposition Plan: Expect patient to be discharged to home after condition improves Consults called:    GI per EDP Admission status: Tele Obs    Sharene Butters, PA-C [facility]   Amion text  [phone]   03/28/2018, 11:55 AM

## 2018-03-28 NOTE — ED Triage Notes (Signed)
Patient was at home and experienced weakness, wobbling, and diaphoresis. Family member at home and patient did not fall or had any "stroke-like" symptoms.

## 2018-03-28 NOTE — ED Notes (Signed)
Occult card placed at bedside for provider.

## 2018-03-28 NOTE — ED Notes (Signed)
CBG 85

## 2018-03-28 NOTE — ED Provider Notes (Signed)
[facility] EMERGENCY DEPARTMENT Provider Note   CSN: [id number] Arrival date & time: 03/28/18  5277     History   Chief Complaint Chief Complaint  Patient presents with  . Near Syncope    HPI Diane Ross is a 84 y.o. female.  HPI Diane Ross is a 84 y.o. female with hx of htn, thyroid disorder, breast ca, in remission, presents to ED with complaint of a near syncopal episode. Pt states she woke up feeling normal, states took her blood pressure medications as she normally does, and went back to make her bed, where her daughter states she noticed patient "going in and out."  Daughter states that the patient never lost consciousness completely, but looks like she was disoriented and about 2 feet.  EMS was called.  Patient got diaphoretic at that time.  Episode lasted several minutes.  Patient states she does remember "going in and out."  She does not remember feeling dizzy or lightheaded.  She does not remember having any chest pain or shortness of breath.  No history of similar symptoms in the past.  Patient also was slightly incontinent of feces which looked dark.  She states she has been feeling well otherwise prior to this episode.  Past Medical History:  Diagnosis Date  . Breast cancer ([place])    left side  . Hypertension   . Thyroid disorder     Patient Active Problem List   Diagnosis Date Noted  . Chronic combined systolic and diastolic congestive heart failure ([place]) 01/02/2018  . Essential hypertension 01/02/2018  . Hypercholesterolemia 01/02/2018  . Hyperthyroidism 01/02/2018    Past Surgical History:  Procedure Laterality Date  . ABDOMINAL HYSTERECTOMY    . BREAST LUMPECTOMY Left      OB History   None      Home Medications    Prior to Admission medications   Medication Sig Start Date End Date Taking? Authorizing Provider  aspirin EC 81 MG tablet Take 81 mg by mouth daily.    [provider]  carvedilol (COREG) 6.25 MG tablet  Take 1 tablet (6.25 mg total) by mouth 2 (two) times daily with a meal. 01/02/18   Croitoru, Mihai, MD  furosemide (LASIX) 40 MG tablet Take 40 mg by mouth daily.    [provider]  losartan (COZAAR) 25 MG tablet Take 1 tablet (25 mg total) by mouth 2 (two) times daily. 02/09/18 09/07/18  Almyra Deforest, PA  potassium chloride SA (K-DUR,KLOR-CON) 20 MEQ tablet Take 20 mEq by mouth daily.    [provider]    Family History No family history on file.  Social History Social History   Tobacco Use  . Smoking status: Never Smoker  . Smokeless tobacco: Never Used  Substance Use Topics  . Alcohol use: No    Frequency: Never  . Drug use: No     Allergies   Patient has no known allergies.   Review of Systems Review of Systems  Constitutional: Negative for chills and fever.  Respiratory: Negative for cough, chest tightness and shortness of breath.   Cardiovascular: Negative for chest pain, palpitations and leg swelling.  Gastrointestinal: Negative for abdominal pain, diarrhea, nausea and vomiting.  Genitourinary: Negative for dysuria, flank pain and pelvic pain.  Musculoskeletal: Negative for arthralgias, myalgias, neck pain and neck stiffness.  Skin: Negative for rash.  Neurological: Positive for dizziness and light-headedness. Negative for weakness and headaches.  All other systems reviewed and are negative.    Physical  Exam Updated Vital Signs BP (!) 103/49 (BP Location: Right Arm)   Pulse (!) 34   Temp 97.8 F (36.6 C) (Oral)   Resp 15   Ht 5' (1.524 m)   Wt 59 kg (130 lb)   SpO2 98%   BMI 25.39 kg/m   Physical Exam  Constitutional: She is oriented to person, place, and time. She appears well-developed and well-nourished. No distress.  HENT:  Head: Normocephalic.  Gums are pale  Eyes: Conjunctivae are normal.  Neck: Neck supple.  Cardiovascular: Normal rate, regular rhythm and normal heart sounds.  Pulmonary/Chest: Effort normal and breath sounds  normal. No respiratory distress. She has no wheezes. She has no rales.  Abdominal: Soft. Bowel sounds are normal. She exhibits no distension. There is no tenderness. There is no rebound.  Genitourinary: Rectal exam shows guaiac positive stool.  Genitourinary Comments: Dark maroon/black stool  Musculoskeletal: She exhibits no edema.  Neurological: She is alert and oriented to person, place, and time. She displays normal reflexes. No cranial nerve deficit. Coordination normal.  Skin: Skin is warm and dry. There is pallor.  Psychiatric: She has a normal mood and affect. Her behavior is normal.  Nursing note and vitals reviewed.    ED Treatments / Results  Labs (all labs ordered are listed, but only abnormal results are displayed) Labs Reviewed  CBC  COMPREHENSIVE METABOLIC PANEL  URINALYSIS, ROUTINE W REFLEX MICROSCOPIC  I-STAT TROPONIN, ED  SAMPLE TO BLOOD BANK    EKG EKG Interpretation  Date/Time:  Saturday March 28 2018 09:53:05 EDT Ventricular Rate:  75 PR Interval:    QRS Duration: 86 QT Interval:  381 QTC Calculation: 426 R Axis:   67 Text Interpretation:  Sinus rhythm Probable left ventricular hypertrophy Nonspecific T abnormalities, lateral leads No significant change since last tracing Confirmed by Blanchie Dessert [phone]) on 03/28/2018 10:08:10 AM   Radiology No results found.  Procedures Procedures (including critical care time)  Medications Ordered in ED Medications - No data to display   Initial Impression / Assessment and Plan / ED Course  I have reviewed the triage vital signs and the nursing notes.  Pertinent labs & imaging results that were available during my care of the patient were reviewed by me and considered in my medical decision making (see chart for details).     Patient in emergency department with near syncopal episode this morning.  My evaluation, patient is in acute no acute distress.  Vital signs are normal at this time.  She is not  complaining of any symptoms at this time while sitting in the stretcher.  Will check orthostatics, will get labs, will monitor closely.  Patient does appear to be very pale, concerning for possible anemia.  Will send "hold clot"   11:13 AM Patient's hemoglobin is 7.8, this is a significant drop in just a few months from last of 11.3.  Patient's stool is very dark maroon almost black in color.  Hemoccult positive.  I suspect she is having a GI bleed.  She has no abdominal pain or tenderness on the exam.  Most likely upper GI bleed.  I will order Protonix, IV fluids.  Will get patient admitted.  11:37 AM Spoke with Dr. Benson Norway with GI will consult.  Spoke with medicine, will admit.   Vitals:   03/28/18 1242 03/28/18 1300 03/28/18 1315 03/28/18 1330  BP: (!) 151/75 (!) 170/86 (!) 143/85 (!) 152/85  Pulse: 74 78 78 82  Resp: 20 (!) 23 20 (!)  29  Temp: 97.8 F (36.6 C)     TempSrc:      SpO2: 100% (!) 87% 100% 100%  Weight:      Height:         Final Clinical Impressions(s) / ED Diagnoses   Final diagnoses:  Gastrointestinal hemorrhage, unspecified gastrointestinal hemorrhage type  Near syncope  Anemia, unspecified type    ED Discharge Orders    None       Jeannett Senior, PA-C 03/28/18 1546    Blanchie Dessert, MD 03/29/18 1541

## 2018-03-29 ENCOUNTER — Encounter (HOSPITAL_COMMUNITY)
Admission: EM | Disposition: A | Discharge status: Home / Self Care | Payer: Self-pay | Source: Home / Self Care | Attending: Family Medicine

## 2018-03-29 DIAGNOSIS — D62 Acute posthemorrhagic anemia: Secondary | ICD-10-CM

## 2018-03-29 DIAGNOSIS — E0590 Thyrotoxicosis, unspecified without thyrotoxic crisis or storm: Secondary | ICD-10-CM

## 2018-03-29 DIAGNOSIS — R1033 Periumbilical pain: Secondary | ICD-10-CM | POA: Diagnosis not present

## 2018-03-29 DIAGNOSIS — K269 Duodenal ulcer, unspecified as acute or chronic, without hemorrhage or perforation: Secondary | ICD-10-CM

## 2018-03-29 DIAGNOSIS — I10 Essential (primary) hypertension: Secondary | ICD-10-CM | POA: Diagnosis not present

## 2018-03-29 DIAGNOSIS — N179 Acute kidney failure, unspecified: Secondary | ICD-10-CM | POA: Diagnosis present

## 2018-03-29 DIAGNOSIS — D509 Iron deficiency anemia, unspecified: Secondary | ICD-10-CM | POA: Diagnosis not present

## 2018-03-29 DIAGNOSIS — R195 Other fecal abnormalities: Secondary | ICD-10-CM | POA: Diagnosis not present

## 2018-03-29 DIAGNOSIS — R159 Full incontinence of feces: Secondary | ICD-10-CM | POA: Diagnosis present

## 2018-03-29 DIAGNOSIS — R55 Syncope and collapse: Secondary | ICD-10-CM | POA: Diagnosis not present

## 2018-03-29 DIAGNOSIS — E440 Moderate protein-calorie malnutrition: Secondary | ICD-10-CM | POA: Diagnosis not present

## 2018-03-29 DIAGNOSIS — I5042 Chronic combined systolic (congestive) and diastolic (congestive) heart failure: Secondary | ICD-10-CM

## 2018-03-29 DIAGNOSIS — Z79899 Other long term (current) drug therapy: Secondary | ICD-10-CM | POA: Diagnosis not present

## 2018-03-29 DIAGNOSIS — K264 Chronic or unspecified duodenal ulcer with hemorrhage: Principal | ICD-10-CM

## 2018-03-29 DIAGNOSIS — Z6826 Body mass index (BMI) 26.0-26.9, adult: Secondary | ICD-10-CM | POA: Diagnosis not present

## 2018-03-29 DIAGNOSIS — Z853 Personal history of malignant neoplasm of breast: Secondary | ICD-10-CM | POA: Diagnosis not present

## 2018-03-29 DIAGNOSIS — Z7982 Long term (current) use of aspirin: Secondary | ICD-10-CM | POA: Diagnosis not present

## 2018-03-29 DIAGNOSIS — E7800 Pure hypercholesterolemia, unspecified: Secondary | ICD-10-CM | POA: Diagnosis present

## 2018-03-29 DIAGNOSIS — E785 Hyperlipidemia, unspecified: Secondary | ICD-10-CM | POA: Diagnosis present

## 2018-03-29 DIAGNOSIS — I110 Hypertensive heart disease with heart failure: Secondary | ICD-10-CM | POA: Diagnosis present

## 2018-03-29 DIAGNOSIS — I429 Cardiomyopathy, unspecified: Secondary | ICD-10-CM | POA: Diagnosis present

## 2018-03-29 DIAGNOSIS — E039 Hypothyroidism, unspecified: Secondary | ICD-10-CM | POA: Diagnosis present

## 2018-03-29 DIAGNOSIS — I5032 Chronic diastolic (congestive) heart failure: Secondary | ICD-10-CM | POA: Diagnosis not present

## 2018-03-29 HISTORY — PX: ESOPHAGOGASTRODUODENOSCOPY: SHX5428

## 2018-03-29 LAB — TYPE AND SCREEN
ABO/RH(D): O POS
Antibody Screen: NEGATIVE
Unit division: 0

## 2018-03-29 LAB — BASIC METABOLIC PANEL
Anion gap: 5 (ref 5–15)
BUN: 36 mg/dL — ABNORMAL HIGH (ref 6–20)
CO2: 29 mmol/L (ref 22–32)
Calcium: 8.6 mg/dL — ABNORMAL LOW (ref 8.9–10.3)
Chloride: 106 mmol/L (ref 101–111)
Creatinine, Ser: 0.95 mg/dL (ref 0.44–1.00)
GFR calc Af Amer: >60 mL/min (ref >60)
GFR calc non Af Amer: 53 mL/min — ABNORMAL LOW (ref >60)
Glucose, Bld: 98 mg/dL (ref 65–99)
Potassium: 3.7 mmol/L (ref 3.5–5.1)
Sodium: 140 mmol/L (ref 135–145)

## 2018-03-29 LAB — CBC
HCT: 26.5 % — ABNORMAL LOW (ref 36.0–46.0)
HCT: 26.5 % — ABNORMAL LOW (ref 36.0–46.0)
HCT: 29.6 % — ABNORMAL LOW (ref 36.0–46.0)
Hemoglobin: 8.3 g/dL — ABNORMAL LOW (ref 12.0–15.0)
Hemoglobin: 8.3 g/dL — ABNORMAL LOW (ref 12.0–15.0)
Hemoglobin: 9.2 g/dL — ABNORMAL LOW (ref 12.0–15.0)
MCH: 29.3 pg (ref 26.0–34.0)
MCH: 29.3 pg (ref 26.0–34.0)
MCH: 29.1 pg (ref 26.0–34.0)
MCHC: 31.3 g/dL (ref 30.0–36.0)
MCHC: 31.3 g/dL (ref 30.0–36.0)
MCHC: 31.1 g/dL (ref 30.0–36.0)
MCV: 93.6 fL (ref 78.0–100.0)
MCV: 93.6 fL (ref 78.0–100.0)
MCV: 93.7 fL (ref 78.0–100.0)
Platelets: 172 10*3/uL (ref 150–400)
Platelets: 185 10*3/uL (ref 150–400)
Platelets: 188 10*3/uL (ref 150–400)
RBC: 2.83 MIL/uL — ABNORMAL LOW (ref 3.87–5.11)
RBC: 2.83 MIL/uL — ABNORMAL LOW (ref 3.87–5.11)
RBC: 3.16 MIL/uL — ABNORMAL LOW (ref 3.87–5.11)
RDW: 14.6 % (ref 11.5–15.5)
RDW: 14.6 % (ref 11.5–15.5)
RDW: 14.5 % (ref 11.5–15.5)
WBC: 7.6 10*3/uL (ref 4.0–10.5)
WBC: 7.4 10*3/uL (ref 4.0–10.5)
WBC: 12.7 10*3/uL — ABNORMAL HIGH (ref 4.0–10.5)

## 2018-03-29 LAB — BPAM RBC
Blood Product Expiration Date: 201907142359
ISSUE DATE / TIME: 201906151207
Unit Type and Rh: 5100

## 2018-03-29 SURGERY — EGD (ESOPHAGOGASTRODUODENOSCOPY)
Anesthesia: Moderate Sedation

## 2018-03-29 MED ORDER — SPOT INK MARKER SYRINGE KIT
PACK | SUBMUCOSAL | Status: AC
  Filled 2018-03-29: qty 5

## 2018-03-29 MED ORDER — DIPHENHYDRAMINE HCL 50 MG/ML IJ SOLN
INTRAMUSCULAR | Status: AC
  Filled 2018-03-29: qty 1

## 2018-03-29 MED ORDER — EPINEPHRINE PF 1 MG/10ML IJ SOSY
PREFILLED_SYRINGE | INTRAMUSCULAR | Status: AC
  Filled 2018-03-29: qty 10

## 2018-03-29 MED ORDER — LOSARTAN POTASSIUM 50 MG PO TABS
25.0000 mg | ORAL_TABLET | Freq: Two times a day (BID) | ORAL | Status: DC
  Administered 2018-03-29 – 2018-03-30 (×2): 25 mg via ORAL
  Filled 2018-03-29 (×3): qty 1

## 2018-03-29 MED ORDER — FENTANYL CITRATE (PF) 100 MCG/2ML IJ SOLN
INTRAMUSCULAR | Status: AC
  Filled 2018-03-29: qty 4

## 2018-03-29 MED ORDER — MIDAZOLAM HCL 10 MG/2ML IJ SOLN
INTRAMUSCULAR | Status: DC | PRN
  Administered 2018-03-29 (×2): 1 mg via INTRAVENOUS
  Administered 2018-03-29: 2 mg via INTRAVENOUS
  Administered 2018-03-29: 1 mg via INTRAVENOUS

## 2018-03-29 MED ORDER — BUTAMBEN-TETRACAINE-BENZOCAINE 2-2-14 % EX AERO
INHALATION_SPRAY | CUTANEOUS | Status: DC | PRN
  Administered 2018-03-29: 2 via TOPICAL

## 2018-03-29 MED ORDER — FENTANYL CITRATE (PF) 100 MCG/2ML IJ SOLN
INTRAMUSCULAR | Status: DC | PRN
  Administered 2018-03-29 (×2): 25 ug via INTRAVENOUS

## 2018-03-29 MED ORDER — FUROSEMIDE 40 MG PO TABS
40.0000 mg | ORAL_TABLET | Freq: Every day | ORAL | Status: DC
  Administered 2018-03-29: 40 mg via ORAL
  Filled 2018-03-29 (×2): qty 1

## 2018-03-29 MED ORDER — PANTOPRAZOLE SODIUM 40 MG IV SOLR
40.0000 mg | Freq: Two times a day (BID) | INTRAVENOUS | Status: DC
  Administered 2018-03-29 (×2): 40 mg via INTRAVENOUS
  Filled 2018-03-29 (×3): qty 40

## 2018-03-29 MED ORDER — MIDAZOLAM HCL 5 MG/ML IJ SOLN
INTRAMUSCULAR | Status: AC
  Filled 2018-03-29: qty 3

## 2018-03-29 MED ORDER — POTASSIUM CHLORIDE CRYS ER 20 MEQ PO TBCR
20.0000 meq | EXTENDED_RELEASE_TABLET | Freq: Every day | ORAL | Status: DC
  Administered 2018-03-29 – 2018-03-31 (×3): 20 meq via ORAL
  Filled 2018-03-29 (×4): qty 1

## 2018-03-29 MED ORDER — CARVEDILOL 6.25 MG PO TABS
6.2500 mg | ORAL_TABLET | Freq: Two times a day (BID) | ORAL | Status: DC
  Administered 2018-03-29 – 2018-03-31 (×4): 6.25 mg via ORAL
  Filled 2018-03-29 (×4): qty 1

## 2018-03-29 NOTE — Plan of Care (Signed)
  Problem: Safety: Goal: Ability to remain free from injury will improve Outcome: Progressing   

## 2018-03-29 NOTE — Progress Notes (Signed)
PROGRESS NOTE  Diane Ross PNT:[id number] DOB: [birth year] DOA: 03/28/2018 PCP: [name], NP  Brief Narrative: 84 year old woman presented with presyncopal symptomatology.  Found to have acute anemia.  Work-up most suggestive of upper GI bleed.  Underwent 1 unit transfusion PRBC with stabilization of hemoglobin.  Seen by GI, underwent upper endoscopy which revealed one nonbleeding duodenal ulcer with visible vessel which was clipped.  GI is recommended PPI.  Assessment/Plan UGIB, probably secondary to duodenal ulcer.  Status post EGD 6/16 showing which revealed nonbleeding duodenal ulcer s/p 1 unit PRBC 6/15 --Hgb stable status post transfusion. --continue PPI indefinitely. --Clear liquid diet per GI. --Hold aspirin.  ABLA, symptomatic with near syncopal episode at home. --Hemoglobin appears stable.  Repeat in a.m.  Essential HTN --Stable.  Continue carvedilol, losartan, furosemide  Hypothyroidism.  Incidental note is made of multi-nodule thyroid seen on nuclear medicine imaging 03/06/2018, discomfort on chart review.  This report referenced an ultrasound from February 2019 which time biopsy was recommended --TSH elevated.  Recommend close outpatient follow-up.  Reportedly followed by endocrinology.  Chronic systolic, diastolic CHF --Appears stable.  Continue furosemide, carvedilol, losartan.  DVT prophylaxis: SCDs Code Status: Full Family Communication: none Disposition Plan: home    Diane Hodgkins, MD  [facility] Direct contact: [phone] --Via amion app OR  --[email or web address]; password [email or web address]  7PM-7AM contact night coverage as above 03/29/2018, 4:21 PM  LOS: 0 days   Consultants:  GI  Procedures:  6/15 1 unit PRBC  6/16 EGD Impression:               - Normal esophagus.                           - Normal mucosa was found in the entire stomach.                           - One non-bleeding duodenal ulcer with a visible   vessel. Clips (MR unsafe) were placed.                           - No specimens collected. Recommendation:           - Return patient to hospital ward for ongoing care.                           - Clear liquid diet.                           - Continue present medications.                           - Check H. pylori status with breath test or stool                            antigen off of PPI.                           - PPI QD indefinitely.  Antimicrobials:    Interval history/Subjective: Feels ok. No CP, no SOB. No bleeding noted. Hungry.  Objective: Vitals:  Vitals:   03/29/18 1255 03/29/18 1324  BP: (!) 131/48 138/70  Pulse: 78 74  Resp: (!) 25 16  Temp:  98 F (36.7 C)  SpO2: 96% 94%    Exam:  Constitutional:  . Appears calm and comfortable Eyes:  . pupils and irises appear normal . Normal lids ENMT:  . grossly normal hearing  Respiratory:  . CTA bilaterally, no w/r/r.  . Respiratory effort normal. Cardiovascular:  . RRR, no m/r/g . No LE extremity edema   Psychiatric:  . Mental status o Mood, affect appropriate . judgment and insight appear intact     I have personally reviewed the following:   Labs:  BMP unremarkable  Hemoglobin stable, 8.3  Platelets and WBC within normal limits  TSH 0.064  Medical tests:  EKG sinus rhythm, nonspecific ST changes, no change compared to previous AT/03/2018  Scheduled Meds: . carvedilol  6.25 mg Oral BID WC  . furosemide  40 mg Oral Daily  . losartan  25 mg Oral BID  . mupirocin ointment  1 application Nasal BID  . pantoprazole (PROTONIX) IV  40 mg Intravenous Q12H  . potassium chloride SA  20 mEq Oral Daily   Continuous Infusions:   Principal Problem:   GIB (gastrointestinal bleeding) Active Problems:   Chronic combined systolic and diastolic congestive heart failure (HCC)   Essential hypertension   Hyperthyroidism   Near syncope   Duodenal ulcer   Acute blood loss anemia   LOS: 0 days

## 2018-03-29 NOTE — Progress Notes (Signed)
Patient alert and oriented x4, able to answer questions appropriately, ambulating with standby assistance and voiding normally post-procedure. Cont meeting her goals toward discharge.

## 2018-03-29 NOTE — Interval H&P Note (Signed)
History and Physical Interval Note:  03/29/2018 12:05 PM  Diane Ross  has presented today for surgery, with the diagnosis of Melena and anemia  The various methods of treatment have been discussed with the patient and family. After consideration of risks, benefits and other options for treatment, the patient has consented to  Procedure(s): ESOPHAGOGASTRODUODENOSCOPY (EGD) (N/A) as a surgical intervention .  The patient's history has been reviewed, patient examined, no change in status, stable for surgery.  I have reviewed the patient's chart and labs.  Questions were answered to the patient's satisfaction.     Loden Laurent D

## 2018-03-29 NOTE — Op Note (Signed)
[facility] Patient Name: Diane Ross Procedure Date : 03/29/2018 MRN: [id number] Attending MD: Carol Ada , MD Date of Birth: 1931-04-19 CSN: [id number] Age: 84 Admit Type: Inpatient Procedure:                Upper GI endoscopy Indications:              Melena Providers:                Carol Ada, MD, Kingsley Plan, RN, Laurena Spies, Technician Referring MD:              Medicines:                Fentanyl 50 micrograms IV, Midazolam 5 mg IV Complications:            No immediate complications. Estimated Blood Loss:     Estimated blood loss was minimal. Procedure:                Pre-Anesthesia Assessment:                           - Prior to the procedure, a History and Physical                            was performed, and patient medications and                            allergies were reviewed. The patient's tolerance of                            previous anesthesia was also reviewed. The risks                            and benefits of the procedure and the sedation                            options and risks were discussed with the patient.                            All questions were answered, and informed consent                            was obtained. Prior Anticoagulants: The patient has                            taken no previous anticoagulant or antiplatelet                            agents. ASA Grade Assessment: III - A patient with                            severe systemic disease. After reviewing the risks  and benefits, the patient was deemed in                            satisfactory condition to undergo the procedure.                           - Sedation was administered by an endoscopy nurse.                            The sedation level attained was moderate.                           After obtaining informed consent, the endoscope was                            passed under  direct vision. Throughout the                            procedure, the patient's blood pressure, pulse, and                            oxygen saturations were monitored continuously. The                            EG-[id number] ([id number]) scope was introduced through the                            mouth, and advanced to the second part of duodenum.                            The upper GI endoscopy was technically difficult                            and complex due to abnormal anatomy. The patient                            tolerated the procedure well. Scope In: Scope Out: Findings:      The esophagus was normal.      Normal mucosa was found in the entire examined stomach.      Once through the GE junction there was resistance with advancing the       endoscope. There was some type of anatomical distortion that was not       able to be characterized endoscopically. Gentle constant pressure       allowed for the endoscope to be advanced. There was no clear evidence of       a hiatal hernia.      One non-bleeding superficial duodenal ulcer with a visible vessel was       found in the duodenal bulb. The lesion was 5 mm in largest dimension.       For hemostasis, two hemostatic clips were successfully placed (MR       unsafe). There was no bleeding at the end of the procedure.      It as difficult to discern, but there was high suspicion of a visible  vessel. Because of the difficulty with maneuvering the endoscope only       one intervention was applied, i.e., hemoclipping rather than the       combination of BICAP and hemoclipping. Two clips were successfully       placed. Impression:               - Normal esophagus.                           - Normal mucosa was found in the entire stomach.                           - One non-bleeding duodenal ulcer with a visible                            vessel. Clips (MR unsafe) were placed.                           - No specimens  collected. Recommendation:           - Return patient to hospital ward for ongoing care.                           - Clear liquid diet.                           - Continue present medications.                           - Check H. pylori status with breath test or stool                            antigen off of PPI.                           - PPI QD indefinitely. Procedure Code(s):        --- Professional ---                           [id number], Esophagogastroduodenoscopy, flexible,                            transoral; with control of bleeding, any method Diagnosis Code(s):        --- Professional ---                           K26.4, Chronic or unspecified duodenal ulcer with                            hemorrhage                           K92.1, Melena (includes Hematochezia) CPT copyright 2017 [employer]. All rights reserved. The codes documented in this report are preliminary and upon coder review may  be revised to meet current compliance requirements. Carol Ada, MD Carol Ada, MD 03/29/2018 12:33:24 PM This report has been signed electronically. Number of  Addenda: 0

## 2018-03-30 ENCOUNTER — Encounter (HOSPITAL_COMMUNITY): Payer: Self-pay | Admitting: Gastroenterology

## 2018-03-30 DIAGNOSIS — E440 Moderate protein-calorie malnutrition: Secondary | ICD-10-CM

## 2018-03-30 LAB — CBC
HCT: 25.9 % — ABNORMAL LOW (ref 36.0–46.0)
HCT: 26.4 % — ABNORMAL LOW (ref 36.0–46.0)
Hemoglobin: 8.2 g/dL — ABNORMAL LOW (ref 12.0–15.0)
Hemoglobin: 8.3 g/dL — ABNORMAL LOW (ref 12.0–15.0)
MCH: 29 pg (ref 26.0–34.0)
MCH: 29.4 pg (ref 26.0–34.0)
MCHC: 31.4 g/dL (ref 30.0–36.0)
MCHC: 31.7 g/dL (ref 30.0–36.0)
MCV: 91.5 fL (ref 78.0–100.0)
MCV: 93.6 fL (ref 78.0–100.0)
Platelets: 174 10*3/uL (ref 150–400)
Platelets: 181 10*3/uL (ref 150–400)
RBC: 2.82 MIL/uL — ABNORMAL LOW (ref 3.87–5.11)
RBC: 2.83 MIL/uL — ABNORMAL LOW (ref 3.87–5.11)
RDW: 14.4 % (ref 11.5–15.5)
RDW: 14.4 % (ref 11.5–15.5)
WBC: 11 10*3/uL — ABNORMAL HIGH (ref 4.0–10.5)
WBC: 11.1 10*3/uL — ABNORMAL HIGH (ref 4.0–10.5)

## 2018-03-30 LAB — BASIC METABOLIC PANEL
Anion gap: 8 (ref 5–15)
BUN: 25 mg/dL — ABNORMAL HIGH (ref 6–20)
CO2: 26 mmol/L (ref 22–32)
Calcium: 8.5 mg/dL — ABNORMAL LOW (ref 8.9–10.3)
Chloride: 100 mmol/L — ABNORMAL LOW (ref 101–111)
Creatinine, Ser: 0.93 mg/dL (ref 0.44–1.00)
GFR calc Af Amer: >60 mL/min (ref >60)
GFR calc non Af Amer: 55 mL/min — ABNORMAL LOW (ref >60)
Glucose, Bld: 92 mg/dL (ref 65–99)
Potassium: 3.8 mmol/L (ref 3.5–5.1)
Sodium: 134 mmol/L — ABNORMAL LOW (ref 135–145)

## 2018-03-30 LAB — HEMOGLOBIN AND HEMATOCRIT, BLOOD
HCT: 24.8 % — ABNORMAL LOW (ref 36.0–46.0)
Hemoglobin: 7.8 g/dL — ABNORMAL LOW (ref 12.0–15.0)

## 2018-03-30 MED ORDER — PANTOPRAZOLE SODIUM 40 MG PO TBEC
40.0000 mg | DELAYED_RELEASE_TABLET | Freq: Two times a day (BID) | ORAL | Status: DC
  Administered 2018-03-30 – 2018-03-31 (×3): 40 mg via ORAL
  Filled 2018-03-30 (×3): qty 1

## 2018-03-30 MED ORDER — ENSURE ENLIVE PO LIQD
237.0000 mL | Freq: Two times a day (BID) | ORAL | Status: DC
  Administered 2018-03-30: 237 mL via ORAL

## 2018-03-30 NOTE — Progress Notes (Signed)
MD notified of patient's Hgb dropping from 8.3 to 7.8 and patient complained of three loose black stools today.

## 2018-03-30 NOTE — Progress Notes (Addendum)
Daily Rounding Note  03/30/2018, 8:13 AM  LOS: 1 day   SUBJECTIVE:   Chief complaint: black watery stools, 2 yesterday.  Pt says stools not like this at home.  No n/v, no abd pain.  No dizziness, SOB.  Feels well      OBJECTIVE:         Vital signs in last 24 hours:    Temp:  [97.6 F (36.4 C)-99.3 F (37.4 C)] 99.3 F (37.4 C) (06/17 0446) Pulse Rate:  [74-101] 99 (06/17 0446) Resp:  [14-32] 22 (06/17 0446) BP: (110-176)/(42-91) 167/91 (06/17 0446) SpO2:  [94 %-100 %] 100 % (06/17 0446) Weight:  [131 lb 6.3 oz (59.6 kg)] 131 lb 6.3 oz (59.6 kg) (06/17 0329) Last BM Date: 03/28/18 Filed Weights   03/28/18 1745 03/29/18 0203 03/30/18 0329  Weight: 128 lb 1.4 oz (58.1 kg) 131 lb 2.8 oz (59.5 kg) 131 lb 6.3 oz (59.6 kg)   General: pleasant, HOH.  Looks well.      Heart: RRR Chest: clear bil.  No dyspnea or cough Abdomen: soft, NT, ND.  No mass.  No HSM.  BS hypoactive  Extremities: no CCE.   Neuro/Psych:  Pleasant, HOH.  Alert.  Appropriate.  Admits to poor memory.  Knows self, place, town but not year.  Moves all 4s, no tremors or gross weakness.   Intake/Output from previous day: 06/16 0701 - 06/17 0700 In: 125 [I.V.:125] Out: -   Intake/Output this shift: No intake/output data recorded.  Lab Results: Recent Labs    03/29/18 0947 03/29/18 2036 03/30/18 0035  WBC 7.4 12.7* 11.1*  11.0*  HGB 8.3* 9.2* 8.3*  8.2*  HCT 26.5* 29.6* 26.4*  25.9*  PLT 185 188 174  181   BMET Recent Labs    03/28/18 1017 03/29/18 0438 03/30/18 0035  NA 140 140 134*  K 4.8 3.7 3.8  CL 106 106 100*  CO2 30 29 26   GLUCOSE 112* 98 92  BUN 54* 36* 25*  CREATININE 1.13* 0.95 0.93  CALCIUM 9.1 8.6* 8.5*   LFT Recent Labs    03/28/18 1017  PROT 6.2*  ALBUMIN 3.1*  AST 15  ALT 10*  ALKPHOS 49  BILITOT 0.4   PT/INR Recent Labs    03/28/18 1828  LABPROT 13.9  INR 1.08   Scheduled Meds: . carvedilol  6.25  mg Oral BID WC  . furosemide  40 mg Oral Daily  . losartan  25 mg Oral BID  . mupirocin ointment  1 application Nasal BID  . pantoprazole (PROTONIX) IV  40 mg Intravenous Q12H  . potassium chloride SA  20 mEq Oral Daily   Continuous Infusions: PRN Meds:.acetaminophen **OR** acetaminophen, bisacodyl, HYDROcodone-acetaminophen, ondansetron **OR** ondansetron (ZOFRAN) IV, senna-docusate   ASSESMENT:   *  UGIB, melena/black stools.  81 ASA, no NSAIDs PTA.   6/16 EGD: Non-bleeding DU with VV, 2 clips placed. No biopsies obtained.    *   Normocytic anemia.  Improved and stable after 1 U PRBCs 6/15.     *   AKI, improved.       PLAN   *  Advance diet to [facility].    *  Switch to po Protonix.  BID for 2 weeks, then 1 x daily.    *  Serum H pylori blood test and Hgb/hct at noon.      *  ? Timing of discharge and of resuming ASA?  Diane Ross  03/30/2018, 8:13 AM Phone [phone]      Attending physician's note   I have taken an interval history, reviewed the chart and examined the patient. I agree with the Advanced Practitioner's note, impression and recommendations. DU without recurrent bleeding. Anemia is stable.   H pylori Ab pending - treat if positive.  PPI bid for 2 weeks then PPI qam indefinitely.  OK to resume ASA 81 mg daily in 7 days as clinically indicated. Must take a PPI long term if she stays on ASA long term.  OK for discharge tomorrow if she remains stable.  GI office follow up with me or APP in 1 month.  GI signing off. Available as needed.   Lucio Edward, MD FACG [phone] office

## 2018-03-30 NOTE — Progress Notes (Signed)
MD notified of patient's manual blood pressure being 104/48.

## 2018-03-30 NOTE — Progress Notes (Signed)
PROGRESS NOTE  Diane Ross QPY:[id number] DOB: 16-Apr-1931 DOA: 03/28/2018 PCP: [name], NP  Brief Narrative: 84 year old woman presented with presyncopal symptomatology.  Found to have acute anemia.  Work-up most suggestive of upper GI bleed.  Underwent 1 unit transfusion PRBC with stabilization of hemoglobin.  Seen by GI, underwent upper endoscopy which revealed one nonbleeding duodenal ulcer with visible vessel which was clipped.  GI is recommended PPI.  Assessment/Plan UGIB, probably secondary to duodenal ulcer.  Status post EGD 6/16 showing which revealed nonbleeding duodenal ulcer s/p 1 unit PRBC 6/15 --Hgb remains stable -- will continue PPI indefinitely. --Diet per GI --resume ASA when ok with GI  ABLA, symptomatic with near syncopal episode at home. --Hemoglobin remains stable. GI rechecking at noon.  Essential HTN --remains stable.  Will continue carvedilol, losartan, furosemide  Hypothyroidism.  Incidental note is made of multi-nodule thyroid seen on nuclear medicine imaging 03/06/2018, discomfort on chart review.  This report referenced an ultrasound from February 2019 which time biopsy was recommended --TSH elevated.  Recommend close outpatient follow-up.  Reportedly followed by endocrinology.  Chronic systolic, diastolic CHF --stable.  Continue furosemide, carvedilol, losartan.  Will check on this PM. Home when cleared by GI.  DVT prophylaxis: SCDs Code Status: Full Family Communication: none Disposition Plan: home    [name], MD  [facility] Direct contact: [phone] --Via amion app OR  --[email or web address]; password [email or web address]  7PM-7AM contact night coverage as above 03/30/2018, 12:04 PM  LOS: 1 day   Consultants:  GI  Procedures:  6/15 1 unit PRBC  6/16 EGD Impression:               - Normal esophagus.                           - Normal mucosa was found in the entire stomach.                           - One non-bleeding duodenal  ulcer with a visible                            vessel. Clips (MR unsafe) were placed.                           - No specimens collected. Recommendation:           - Return patient to hospital ward for ongoing care.                           - Clear liquid diet.                           - Continue present medications.                           - Check H. pylori status with breath test or stool                            antigen off of PPI.                           - PPI QD indefinitely.  Antimicrobials:  Interval history/Subjective: Feels fine today. Some black stools overnight. No pain. Breathing fine. Tolerating liquids.  Objective: Vitals:  Vitals:   03/30/18 0446 03/30/18 0815  BP: (!) 167/91 139/61  Pulse: 99 78  Resp: (!) 22 14  Temp: 99.3 F (37.4 C) 98.1 F (36.7 C)  SpO2: 100% 100%    Exam:  Constitutional:   . Appears calm and comfortable Respiratory:  . CTA bilaterally, no w/r/r.  . Respiratory effort normal.  Cardiovascular:  . RRR, no m/r/g . No LE extremity edema   Abdomen:  . Soft, ntnd Psychiatric:  . Mental status o Mood, affect appropriate  I have personally reviewed the following:   Labs:  Hgb stable, 8.3  Scheduled Meds: . carvedilol  6.25 mg Oral BID WC  . furosemide  40 mg Oral Daily  . losartan  25 mg Oral BID  . mupirocin ointment  1 application Nasal BID  . pantoprazole  40 mg Oral BID  . potassium chloride SA  20 mEq Oral Daily   Continuous Infusions:   Principal Problem:   GIB (gastrointestinal bleeding) Active Problems:   Chronic combined systolic and diastolic congestive heart failure (HCC)   Essential hypertension   Hyperthyroidism   Near syncope   Duodenal ulcer   Acute blood loss anemia   LOS: 1 day

## 2018-03-30 NOTE — Progress Notes (Signed)
Initial Nutrition Assessment  DOCUMENTATION CODES:   Non-severe (moderate) malnutrition in context of chronic illness  INTERVENTION:   Ensure Enlive po BID, each supplement provides 350 kcal and 20 grams of protein  NUTRITION DIAGNOSIS:   Moderate Malnutrition related to chronic illness(CHF) as evidenced by mild fat depletion, moderate fat depletion, moderate muscle depletion, energy intake < 75% for > or equal to 1 month.  GOAL:   Patient will meet greater than or equal to 90% of their needs  MONITOR:   PO intake, Supplement acceptance, Weight trends, Labs  REASON FOR ASSESSMENT:   Malnutrition Screening Tool    ASSESSMENT:   Patient with PMH significant for left breast cancer s/p lumpectomy, CHF, HTN, HLD, and thyroid disorder. Presents this admission with near syncopal episode secondary to symptomatic anemia of GI bleed.    Pt denies having a loss in appetite PTA. States she typically eats 2 meals per day that consist of salads, soups, chicken, yogurt, cucumbers, and greens. She is trying to get to her goal weight of 136 lb by changing her eating habits. Suspect pt has not been meeting her needs with two meals each day. Meal completion charted as 70% for breakfast this morning which she reports was just cereal. Discussed the importance of protein intake for preservation of lean body mass. Pt amendable to Ensure this stay.   Pt endorses a UBW of 185 lb. Records indicates pt weighed 141 lb on 11/20/17 and 131 lb this admission. Unable to determine actual wt loss versus dry wt loss with CHF history. Nutrition-Focused physical exam completed.   Medications reviewed and include: 40 mg lasix once/day, KCl 20 mEq once/day Labs reviewed: Na 134 (L)  NUTRITION - FOCUSED PHYSICAL EXAM:    Most Recent Value  Orbital Region  No depletion  Upper Arm Region  Moderate depletion  Thoracic and Lumbar Region  Unable to assess  Buccal Region  Mild depletion  Temple Region  Severe depletion   Clavicle Bone Region  Severe depletion  Clavicle and Acromion Bone Region  Severe depletion  Scapular Bone Region  Unable to assess  Dorsal Hand  Mild depletion  Patellar Region  Moderate depletion  Anterior Thigh Region  Moderate depletion  Posterior Calf Region  Mild depletion  Edema (RD Assessment)  None  Hair  Reviewed  Eyes  Reviewed  Mouth  Reviewed  Skin  Reviewed  Nails  Reviewed     Diet Order:   Diet Order           Diet Heart Room service appropriate? Yes; Fluid consistency: Thin  Diet effective now          EDUCATION NEEDS:   Education needs have been addressed  Skin:  Skin Assessment: Reviewed RN Assessment  Last BM:  03/28/18  Height:   Ht Readings from Last 1 Encounters:  03/28/18 5' (1.524 m)    Weight:   Wt Readings from Last 1 Encounters:  03/30/18 131 lb 6.3 oz (59.6 kg)    Ideal Body Weight:  45.5 kg  BMI:  Body mass index is 25.66 kg/m.  Estimated Nutritional Needs:   Kcal:  1500-1700 kcal  Protein:  75-85 g  Fluid:  >1.5 L/day    Mariana Single RD, LDN Clinical Nutrition Pager # [phone]

## 2018-03-31 DIAGNOSIS — E440 Moderate protein-calorie malnutrition: Secondary | ICD-10-CM

## 2018-03-31 DIAGNOSIS — I5032 Chronic diastolic (congestive) heart failure: Secondary | ICD-10-CM

## 2018-03-31 LAB — CBC
HCT: 25.6 % — ABNORMAL LOW (ref 36.0–46.0)
Hemoglobin: 8 g/dL — ABNORMAL LOW (ref 12.0–15.0)
MCH: 29.5 pg (ref 26.0–34.0)
MCHC: 31.3 g/dL (ref 30.0–36.0)
MCV: 94.5 fL (ref 78.0–100.0)
Platelets: 172 10*3/uL (ref 150–400)
RBC: 2.71 MIL/uL — ABNORMAL LOW (ref 3.87–5.11)
RDW: 14.4 % (ref 11.5–15.5)
WBC: 7.4 10*3/uL (ref 4.0–10.5)

## 2018-03-31 LAB — H PYLORI, IGM, IGG, IGA AB
H Pylori IgG: 6.94 Index Value — ABNORMAL HIGH (ref 0.00–0.79)
H. Pylogi, Iga Abs: <9 units (ref 0.0–8.9)
H. Pylogi, Igm Abs: <9 units (ref 0.0–8.9)

## 2018-03-31 MED ORDER — LACTATED RINGERS IV BOLUS: 500.0000 mL | Freq: Once | INTRAVENOUS | Status: DC

## 2018-03-31 MED ORDER — SODIUM CHLORIDE 0.9 % IV BOLUS
500.0000 mL | Freq: Once | INTRAVENOUS | Status: AC
  Administered 2018-03-31: 500 mL via INTRAVENOUS

## 2018-03-31 MED ORDER — PANTOPRAZOLE SODIUM 40 MG PO TBEC: DELAYED_RELEASE_TABLET | ORAL | 0 refills | Status: DC

## 2018-03-31 NOTE — Progress Notes (Signed)
Diane Ross to be D/C'd Home per MD order.  Discussed with the patient and all questions fully answered.  VSS, Skin clean, dry and intact without evidence of skin break down, no evidence of skin tears noted. IV catheter discontinued intact. Site without signs and symptoms of complications. Dressing and pressure applied.  An After Visit Summary was printed and given to the patient. Patient received prescription.  D/c education completed with patient/family including follow up instructions, medication list, d/c activities limitations if indicated, with other d/c instructions as indicated by MD - patient able to verbalize understanding, all questions fully answered.   Patient instructed to return to ED, call 911, or call MD for any changes in condition.   Patient escorted via [place], and D/C home via private auto.  [name] 03/31/2018 12:59 PM

## 2018-03-31 NOTE — Discharge Summary (Addendum)
Physician Discharge Summary  Diane Ross WEX:[id number] DOB: 22-Apr-1931 DOA: 03/28/2018  PCP: Diane Rossetti, NP  Admit date: 03/28/2018 Discharge date: 03/31/2018  Recommendations for Outpatient Follow-up:   UGIB, secondary to duodenal ulcer --PPI BID x2 weeks, then daily. Will continue PPI indefinitely. --can resume ASA 6/24, however pt only started recently and does not want to restart. I do not see any compelling indication to restart. --f/u with Dr. Fuller Plan 1 month.  Hyperthyroidism.  Followed by Dr. Buddy Duty, endocrinology.  Non-severe (moderate) malnutrition in context of chronic illness --follow-up as an outpatient.   Follow-up Information    Diane Rossetti, NP Follow up.   Specialty:  Nurse Practitioner Why:  as needed Contact information: [address] [place] [address] [place] [address] [phone]        [name], MD. Schedule an appointment as soon as possible for a visit in 4 week(s).   Specialty:  Gastroenterology Contact information: [address] [place] [address] [phone]            Discharge Diagnoses:  1. UGIB, secondary to duodenal ulcer 2. ABLA 3. Essential HTN 4. Hyperthyroidism 5. Chronic diastolic CHF 6. Non-severe (moderate) malnutrition in context of chronic illness  Discharge Condition: improved Disposition: home  Diet recommendation: heart healthy  Filed Weights   03/29/18 0203 03/30/18 0329 03/31/18 0529  Weight: 59.5 kg (131 lb 2.8 oz) 59.6 kg (131 lb 6.3 oz) 61.7 kg (136 lb 0.4 oz)    History of present illness:  84 year old woman presented with presyncopal symptomatology.  Found to have acute anemia, likely GIB.  Hospital Course:  Work-up most suggestive of upper GI bleed.  Underwent 1 unit transfusion PRBC with stabilization of hemoglobin.  Seen by GI, underwent upper endoscopy which revealed one nonbleeding duodenal ulcer with visible vessel which was clipped.  GI recommended PPI.  Hemoglobin  stabilized, no further evidence of bleeding.  Transient hypotension noted which may have been artifactual.  Slightly hypertensive on discharge.  UGIB, secondary to duodenal ulcer.  Status post EGD 6/16 showing which revealed nonbleeding duodenal ulcer s/p 1 unit PRBC 6/15 --Hgb remains stable. No further bleeding. --PPI BID x2 weeks, then daily. Will continue PPI indefinitely. --can resume ASA 6/24, however pt only started recently and does not want to restart. I do not see any compelling indication to restart. --f/u with Dr. Fuller Plan 1 month.  ABLA, symptomatic with near syncopal episode at home. --Hemoglobin remained stable s/p 1 unit PRBC  Essential HTN --Transient hypotension likely related to limited oral intake.  Now slightly hypertensive.  Will continue carvedilol, losartan, furosemide on discharge.  Hyperthyroidism.  Incidental note is made of multi-nodule thyroid seen on nuclear medicine imaging 03/06/2018, discomfort on chart review.  This report referenced an ultrasound from February 2019 which time biopsy was recommended --TSH elevated.  Recommend close outpatient follow-up. Followed by Dr. Buddy Duty endocrinology.  Chronic diastolic CHF.  Echo 4/1 showed normal LV function, 50-55%.  Grade 1 diastolic dysfunction. --Remained stable.    Continue furosemide, carvedilol, losartan.  Non-severe (moderate) malnutrition in context of chronic illness --follow-up as an outpatient.  Consultants:  GI  Procedures:  6/15 1 unit PRBC  6/16 EGD Impression: - Normal esophagus. - Normal mucosa was found in the entire stomach. - One non-bleeding duodenal ulcer with a visible  vessel. Clips (MR unsafe) were placed. - No specimens collected. Recommendation: - Return patient to hospital ward for ongoing care. - Clear liquid  diet. - Continue present medications. - Check H.  pylori status with breath test or stool  antigen off of PPI. - PPI QD indefinitely.  Today's assessment: S: feels fine, no pain, no bleeding. A little black stool. No dizziness. Ambulating to bathroom without difficulty O: Vitals:  Vitals:   03/31/18 0837 03/31/18 0838  BP: 140/70 (!) 145/68  Pulse:    Resp:    Temp:    SpO2:      Constitutional:  . Appears calm and comfortable Respiratory:  . CTA bilaterally, no w/r/r.  . Respiratory effort normal Cardiovascular:  . RRR, no m/r/g . No LE extremity edema   Psychiatric:  . Mental status o Mood, affect appropriate  Hgb stable 8.0  Discharge Instructions  Discharge Instructions    Diet - low sodium heart healthy   Complete by:  As directed    Discharge instructions   Complete by:  As directed    Call your physician or seek immediate medical attention for bleeding, dark stools, weakness, dizziness, passing out, lightheadedness or worsening of condition.   Increase activity slowly   Complete by:  As directed      Allergies as of 03/31/2018   No Known Allergies     Medication List    STOP taking these medications   aspirin EC 81 MG tablet     TAKE these medications   carboxymethylcellulose 0.5 % Soln Commonly known as:  REFRESH PLUS Place 2 drops into both eyes 3 (three) times daily as needed (for dry eyes).   carvedilol 6.25 MG tablet Commonly known as:  COREG Take 1 tablet (6.25 mg total) by mouth 2 (two) times daily with a meal.   furosemide 40 MG tablet Commonly known as:  LASIX Take 40 mg by mouth daily.   losartan 25 MG tablet Commonly known as:  COZAAR Take 1 tablet (25 mg total) by mouth 2 (two) times daily.   multivitamin tablet Take 1 tablet by mouth daily.   pantoprazole 40 MG tablet Commonly known as:  PROTONIX Take 1 tablet (40 mg  total) by mouth 2 (two) times daily for 14 days, THEN 1 tablet (40 mg total) 2 (two) times daily. Start taking on:  03/31/2018   potassium chloride SA 20 MEQ tablet Commonly known as:  K-DUR,KLOR-CON Take 20 mEq by mouth daily.   vitamin B-12 100 MCG tablet Commonly known as:  CYANOCOBALAMIN Take 100 mcg by mouth daily.      No Known Allergies  The results of significant diagnostics from this hospitalization (including imaging, microbiology, ancillary and laboratory) are listed below for reference.    Significant Diagnostic Studies:  Microbiology: Recent Results (from the past 240 hour(s))  Surgical PCR screen     Status: Abnormal   Collection Time: 03/28/18  7:54 PM  Result Value Ref Range Status   MRSA, PCR NEGATIVE NEGATIVE Final   Staphylococcus aureus POSITIVE (A) NEGATIVE Final    Comment: (NOTE) The Xpert SA Assay (FDA approved for NASAL specimens in patients 69 years of age and older), is one component of a comprehensive surveillance program. It is not intended to diagnose infection nor to guide or monitor treatment. Performed at [facility], [place] [address], [place], [place] [address]      Labs: Basic Metabolic Panel: Recent Labs  Lab 03/28/18 1017 03/29/18 0438 03/30/18 0035  NA 140 140 134*  K 4.8 3.7 3.8  CL 106 106 100*  CO2 30 29 26   GLUCOSE 112* 98 92  BUN 54* 36* 25*  CREATININE 1.13* 0.95  0.93  CALCIUM 9.1 8.6* 8.5*   Liver Function Tests: Recent Labs  Lab 03/28/18 1017  AST 15  ALT 10*  ALKPHOS 49  BILITOT 0.4  PROT 6.2*  ALBUMIN 3.1*   CBC: Recent Labs  Lab 03/29/18 0438 03/29/18 0947 03/29/18 2036 03/30/18 0035 03/30/18 1207 03/31/18 0812  WBC 7.6 7.4 12.7* 11.1*  11.0*  --  7.4  HGB 8.3* 8.3* 9.2* 8.3*  8.2* 7.8* 8.0*  HCT 26.5* 26.5* 29.6* 26.4*  25.9* 24.8* 25.6*  MCV 93.6 93.6 93.7 93.6  91.5  --  94.5  PLT 172 185 188 174  181  --  172    CBG: Recent Labs  Lab 03/28/18 1032  GLUCAP 85    Principal  Problem:   GIB (gastrointestinal bleeding) Active Problems:   Chronic combined systolic and diastolic congestive heart failure (HCC)   Essential hypertension   Hyperthyroidism   Near syncope   Duodenal ulcer   Acute blood loss anemia   Malnutrition of moderate degree   Time coordinating discharge: 25 minutes  Signed:  [name], MD [facility] 03/31/2018, 9:06 AM

## 2018-03-31 NOTE — Progress Notes (Signed)
Pt's BP was 77/50. Provider on call was notified. 500 cc bolus was given. Will continue to monitor.

## 2018-04-03 DIAGNOSIS — E0590 Thyrotoxicosis, unspecified without thyrotoxic crisis or storm: Secondary | ICD-10-CM | POA: Diagnosis not present

## 2018-04-03 DIAGNOSIS — I5022 Chronic systolic (congestive) heart failure: Secondary | ICD-10-CM | POA: Diagnosis not present

## 2018-04-03 DIAGNOSIS — K922 Gastrointestinal hemorrhage, unspecified: Secondary | ICD-10-CM | POA: Diagnosis not present

## 2018-04-06 DIAGNOSIS — Z822 Family history of deafness and hearing loss: Secondary | ICD-10-CM | POA: Diagnosis not present

## 2018-04-06 DIAGNOSIS — H903 Sensorineural hearing loss, bilateral: Secondary | ICD-10-CM | POA: Diagnosis not present

## 2018-04-07 ENCOUNTER — Encounter: Payer: Self-pay | Admitting: Gastroenterology

## 2018-04-20 ENCOUNTER — Other Ambulatory Visit: Payer: Self-pay

## 2018-04-20 DIAGNOSIS — E0590 Thyrotoxicosis, unspecified without thyrotoxic crisis or storm: Secondary | ICD-10-CM | POA: Diagnosis not present

## 2018-04-20 DIAGNOSIS — E042 Nontoxic multinodular goiter: Secondary | ICD-10-CM | POA: Diagnosis not present

## 2018-04-20 MED ORDER — PANTOPRAZOLE SODIUM 40 MG PO TBEC: 40.0000 mg | DELAYED_RELEASE_TABLET | Freq: Two times a day (BID) | ORAL | 3 refills | Status: DC

## 2018-04-20 MED ORDER — BIS SUBCIT-METRONID-TETRACYC 140-125-125 MG PO CAPS: 3.0000 | ORAL_CAPSULE | Freq: Three times a day (TID) | ORAL | 0 refills | Status: DC

## 2018-04-20 NOTE — Progress Notes (Signed)
[name], MD sent to Marlon Pel, RN        Recent DU with bleed. H pylori Ab positive.  Pylera 3 po qid x 14d  Protonix (or other PPI) 1 po bid x 14d then resume qd   Previous Messages      d

## 2018-04-22 ENCOUNTER — Telehealth: Payer: Self-pay | Admitting: Gastroenterology

## 2018-04-23 MED ORDER — BISMUTH SUBSALICYLATE 262 MG PO CHEW: 524.0000 mg | CHEWABLE_TABLET | Freq: Four times a day (QID) | ORAL | 0 refills | Status: AC

## 2018-04-23 MED ORDER — METRONIDAZOLE 250 MG PO TABS: 250.0000 mg | ORAL_TABLET | Freq: Four times a day (QID) | ORAL | 0 refills | Status: AC

## 2018-04-23 MED ORDER — DOXYCYCLINE HYCLATE 100 MG PO CAPS: 100.0000 mg | ORAL_CAPSULE | Freq: Two times a day (BID) | ORAL | 0 refills | Status: AC

## 2018-04-23 NOTE — Telephone Encounter (Signed)
Informed Shirlean Mylar (patient's daughter) there is an alternative to Pylera and its a combination of 3 medications x 14 days along with the protonix twice daily x 14 days. Informed Shirlean Mylar if there is any more concerns when she picks up these medications then to contact our office.

## 2018-04-23 NOTE — Telephone Encounter (Signed)
Pt's daughter calling back in regards of this states she needs to hear back from someone today. Best call back # [phone].

## 2018-04-24 ENCOUNTER — Other Ambulatory Visit: Payer: Self-pay | Admitting: Internal Medicine

## 2018-04-24 DIAGNOSIS — E041 Nontoxic single thyroid nodule: Secondary | ICD-10-CM

## 2018-04-24 DIAGNOSIS — E042 Nontoxic multinodular goiter: Secondary | ICD-10-CM

## 2018-04-24 DIAGNOSIS — E0590 Thyrotoxicosis, unspecified without thyrotoxic crisis or storm: Secondary | ICD-10-CM

## 2018-04-27 NOTE — Telephone Encounter (Signed)
Patients daughter notified 2 pepto bismol BId for 14 days

## 2018-04-27 NOTE — Telephone Encounter (Signed)
Patient's daughter Shirlean Mylar wants to know how often she needs to take Pepto bismul? She understands it's late but would really appreciate a call back today if possible.

## 2018-05-13 ENCOUNTER — Encounter: Payer: Self-pay | Admitting: Gastroenterology

## 2018-05-13 ENCOUNTER — Other Ambulatory Visit (INDEPENDENT_AMBULATORY_CARE_PROVIDER_SITE_OTHER): Payer: Medicare Other

## 2018-05-13 ENCOUNTER — Ambulatory Visit (INDEPENDENT_AMBULATORY_CARE_PROVIDER_SITE_OTHER): Payer: Medicare Other | Admitting: Gastroenterology

## 2018-05-13 VITALS — BP 128/68 | HR 68 | Ht 66.0 in | Wt 133.6 lb

## 2018-05-13 DIAGNOSIS — D62 Acute posthemorrhagic anemia: Secondary | ICD-10-CM

## 2018-05-13 DIAGNOSIS — K264 Chronic or unspecified duodenal ulcer with hemorrhage: Secondary | ICD-10-CM | POA: Diagnosis not present

## 2018-05-13 LAB — CBC WITH DIFFERENTIAL/PLATELET
Basophils Absolute: 0.1 10*3/uL (ref 0.0–0.1)
Basophils Relative: 0.7 % (ref 0.0–3.0)
Eosinophils Absolute: 0.1 10*3/uL (ref 0.0–0.7)
Eosinophils Relative: 1.5 % (ref 0.0–5.0)
HCT: 31.5 % — ABNORMAL LOW (ref 36.0–46.0)
Hemoglobin: 10.5 g/dL — ABNORMAL LOW (ref 12.0–15.0)
Lymphocytes Relative: 22 % (ref 12.0–46.0)
Lymphs Abs: 1.6 10*3/uL (ref 0.7–4.0)
MCHC: 33.5 g/dL (ref 30.0–36.0)
MCV: 92.1 fl (ref 78.0–100.0)
Monocytes Absolute: 0.5 10*3/uL (ref 0.1–1.0)
Monocytes Relative: 7.1 % (ref 3.0–12.0)
Neutro Abs: 4.8 10*3/uL (ref 1.4–7.7)
Neutrophils Relative %: 68.7 % (ref 43.0–77.0)
Platelets: 244 10*3/uL (ref 150.0–400.0)
RBC: 3.42 Mil/uL — ABNORMAL LOW (ref 3.87–5.11)
RDW: 14.4 % (ref 11.5–15.5)
WBC: 7.1 10*3/uL (ref 4.0–10.5)

## 2018-05-13 MED ORDER — PANTOPRAZOLE SODIUM 40 MG PO TBEC: 40.0000 mg | DELAYED_RELEASE_TABLET | Freq: Every day | ORAL | 11 refills | Status: DC

## 2018-05-13 NOTE — Patient Instructions (Signed)
Your provider has requested that you go to the basement level for lab work before leaving today. Press "B" on the elevator. The lab is located at the first door on the left as you exit the elevator.  Decrease to Protonix 40 mg once daily. A new prescription has been sent to the pharmacy. Please stay on this long-term.   Thank you for choosing me and [facility].  Pricilla Riffle. Dagoberto Ligas., MD., [facility]

## 2018-05-13 NOTE — Progress Notes (Signed)
    History of Present Illness: This is an 84 year old female with a hospitalization in mid June for a DU with a bleed and ABL anemia.  She is accompanied by her daughter.  Hb=8.0 at discharge. H pylori Ab was positive and she completed a course of therapy for H. pylori eradication.  She has no gastrointestinal complaints today.  She feels her energy level has improved.   Current Medications, Allergies, Past Medical History, Past Surgical History, Family History and Social History were reviewed in [employer] record.  Physical Exam: General: Well developed, elderly, thin, no acute distress Head: Normocephalic and atraumatic Eyes:  sclerae anicteric, EOMI Ears: Normal auditory acuity Mouth: No deformity or lesions Lungs: Clear throughout to auscultation Heart: Regular rate and rhythm; no murmurs, rubs or bruits Abdomen: Soft, non tender and non distended. No masses, hepatosplenomegaly or hernias noted. Normal Bowel sounds Rectal: Not done Musculoskeletal: Symmetrical with no gross deformities  Pulses:  Normal pulses noted Extremities: No clubbing, cyanosis, edema or deformities noted Neurological: Alert oriented x 4, grossly nonfocal Psychological:  Alert and cooperative. Normal mood and affect  Assessment and Recommendations:  1. DU with bleed.  H. pylori.  Completed course of therapy for eradication. Change pantoprazole to 40 mg p.o. every morning for use indefinitely (forever) to help to prevent ulcer recurrence.  She will review with her cardiologist resuming aspirin.  If aspirin is indicated she is cleared to resume aspirin 81 mg daily.  Further follow up with PCP. GI follow-up PRN.  2. ABL anemia. CBC today. Further follow up with PCP.  GI follow-up PRN.  I spent 15 minutes of face-to-face time with the patient. Greater than 50% of the time was spent counseling and coordinating care.

## 2018-05-15 DIAGNOSIS — E0590 Thyrotoxicosis, unspecified without thyrotoxic crisis or storm: Secondary | ICD-10-CM | POA: Diagnosis not present

## 2018-05-15 DIAGNOSIS — E042 Nontoxic multinodular goiter: Secondary | ICD-10-CM | POA: Diagnosis not present

## 2018-05-20 ENCOUNTER — Ambulatory Visit
Admission: RE | Admit: 2018-05-20 | Discharge: 2018-05-20 | Disposition: A | Discharge status: Home / Self Care | Payer: Medicare Other | Source: Ambulatory Visit | Attending: Internal Medicine | Admitting: Internal Medicine

## 2018-05-20 ENCOUNTER — Other Ambulatory Visit (HOSPITAL_COMMUNITY)
Admission: RE | Admit: 2018-05-20 | Discharge: 2018-05-20 | Disposition: A | Discharge status: Home / Self Care | Payer: Medicare Other | Source: Ambulatory Visit | Attending: Radiology | Admitting: Radiology

## 2018-05-20 DIAGNOSIS — E079 Disorder of thyroid, unspecified: Secondary | ICD-10-CM | POA: Diagnosis not present

## 2018-05-20 DIAGNOSIS — E042 Nontoxic multinodular goiter: Secondary | ICD-10-CM

## 2018-05-20 DIAGNOSIS — E041 Nontoxic single thyroid nodule: Secondary | ICD-10-CM | POA: Insufficient documentation

## 2018-06-01 ENCOUNTER — Ambulatory Visit (INDEPENDENT_AMBULATORY_CARE_PROVIDER_SITE_OTHER): Payer: Medicare Other | Admitting: Cardiovascular Disease

## 2018-06-01 ENCOUNTER — Encounter: Payer: Self-pay | Admitting: Cardiovascular Disease

## 2018-06-01 VITALS — BP 97/68 | HR 78 | Ht 64.0 in | Wt 134.2 lb

## 2018-06-01 DIAGNOSIS — I5042 Chronic combined systolic (congestive) and diastolic (congestive) heart failure: Secondary | ICD-10-CM | POA: Diagnosis not present

## 2018-06-01 DIAGNOSIS — K269 Duodenal ulcer, unspecified as acute or chronic, without hemorrhage or perforation: Secondary | ICD-10-CM

## 2018-06-01 DIAGNOSIS — E042 Nontoxic multinodular goiter: Secondary | ICD-10-CM | POA: Diagnosis not present

## 2018-06-01 DIAGNOSIS — I10 Essential (primary) hypertension: Secondary | ICD-10-CM

## 2018-06-01 DIAGNOSIS — Z79899 Other long term (current) drug therapy: Secondary | ICD-10-CM | POA: Diagnosis not present

## 2018-06-01 MED ORDER — LOSARTAN POTASSIUM 50 MG PO TABS: 50.0000 mg | ORAL_TABLET | Freq: Two times a day (BID) | ORAL | 3 refills | Status: DC

## 2018-06-01 NOTE — Patient Instructions (Signed)
Medication Instructions: Dr [name] has recommended making the following medication changes: 1. INCREASE Losartan to 50 mg twice daily  Labwork: Your physician recommends that you return for lab work in 61 month.  Testing/Procedures: NONE ORDERED  Follow-up: Dr [name] recommends that you schedule a follow-up appointment in 6 months. You will receive a reminder letter in the mail two months in advance. If you don't receive a letter, please call our office to schedule the follow-up appointment.  If you need a refill on your cardiac medications before your next appointment, please call your pharmacy.

## 2018-06-01 NOTE — Progress Notes (Signed)
Cardiology office note:    Date:  06/01/2018   ID:  Diane Ross, DOB 08/20/1931, MRN [id number]  PCP:  [name], NP  Cardiologist:  New  Referring MD: [name], NP  No chief complaint on file.    History of Present Illness:    Diane Ross is a 84 y.o. female with a hx of long-standing hypertension, systolic heart failure, returning in follow-up.  The patient specifically denies any chest pain at rest exertion, dyspnea at rest or with exertion, orthopnea, paroxysmal nocturnal dyspnea, syncope, palpitations, focal neurological deficits, intermittent claudication, lower extremity edema, unexplained weight gain, cough, hemoptysis or wheezing.  Climbs 13 steps to her daughter's home without shortness of breath.  She rarely sees some indentation in her ankles from her socks at the end of the day.  Last EF assessment was echo in April 2019, 50-55%, little improved compared to [facility] in February, EF 45% (No perfusion abnormalities were seen).    Hospitalized with a bleeding duodenal ulcer June.  Completed treatment for H. pylori.  Hemoglobin increased from 8.0 to 10.5 on July 31.  Thyroid nodule fine-needle aspiration showed atypical findings of uncertain significance on 05/20/2018.  TSH has not been repeated  She has not smoked since 1954.  She has a history of left breast cancer treated with lumpectomy.  She is not certain, but believes she may have received chemotherapy.  She does not recognize the words Adriamycin or doxorubicin.  She has previously undergone hysterectomy.  Past Medical History:  Diagnosis Date  . Breast cancer ([place])    left side  . Hypertension   . Thyroid disorder     Past Surgical History:  Procedure Laterality Date  . ABDOMINAL HYSTERECTOMY    . BREAST LUMPECTOMY Left   . ESOPHAGOGASTRODUODENOSCOPY N/A 03/29/2018   Procedure: ESOPHAGOGASTRODUODENOSCOPY (EGD);  Surgeon: Carol Ada, MD;  Location: [facility];  Service: Endoscopy;   Laterality: N/A;    Current Medications: Current Meds  Medication Sig  . carboxymethylcellulose (REFRESH PLUS) 0.5 % SOLN Place 2 drops into both eyes 3 (three) times daily as needed (for dry eyes).  . carvedilol (COREG) 6.25 MG tablet Take 1 tablet (6.25 mg total) by mouth 2 (two) times daily with a meal.  . furosemide (LASIX) 40 MG tablet Take 40 mg by mouth daily.  Marland Kitchen losartan (COZAAR) 50 MG tablet Take 1 tablet (50 mg total) by mouth 2 (two) times daily.  . Multiple Vitamin (MULTIVITAMIN) tablet Take 1 tablet by mouth daily.  . pantoprazole (PROTONIX) 40 MG tablet Take 1 tablet (40 mg total) by mouth daily.  . potassium chloride SA (K-DUR,KLOR-CON) 20 MEQ tablet Take 20 mEq by mouth daily.  . vitamin B-12 (CYANOCOBALAMIN) 100 MCG tablet Take 100 mcg by mouth daily.  . [DISCONTINUED] losartan (COZAAR) 25 MG tablet Take 1 tablet (25 mg total) by mouth 2 (two) times daily.     Allergies:   Patient has no known allergies.   Social History   Socioeconomic History  . Marital status: Widowed    Spouse name: Not on file  . Number of children: Not on file  . Years of education: Not on file  . Highest education level: Not on file  Occupational History  . Occupation: retired  Scientific laboratory technician  . Financial resource strain: Not on file  . Food insecurity:    Worry: Not on file    Inability: Not on file  . Transportation needs:    Medical: Not on file  Non-medical: Not on file  Tobacco Use  . Smoking status: Former Smoker    Years: 55.00  . Smokeless tobacco: Never Used  Substance and Sexual Activity  . Alcohol use: No    Frequency: Never  . Drug use: No  . Sexual activity: Never  Lifestyle  . Physical activity:    Days per week: Not on file    Minutes per session: Not on file  . Stress: Not on file  Relationships  . Social connections:    Talks on phone: Not on file    Gets together: Not on file    Attends religious service: Not on file    Active member of club or  organization: Not on file    Attends meetings of clubs or organizations: Not on file    Relationship status: Not on file  Other Topics Concern  . Not on file  Social History Narrative  . Not on file     Family History: The patient's mother had diabetes.   1 of her brothers had diabetes and drug issues and has passed away.  Another brother is alive and well at age 44.  She has a healthy sister.  2 of her daughters have hypertension there does not appear to be a prominent family history of coronary disease. ROS:   Please see the history of present illness.    All other systems reviewed and are negative.  EKGs/Labs/Other Studies Reviewed:    EKG:  EKG is not ordered today.    Recent Labs: 03/28/2018: ALT 10; TSH 0.064 03/30/2018: BUN 25; Creatinine, Ser 0.93; Potassium 3.8; Sodium 134 05/13/2018: Hemoglobin 10.5; Platelets 244.0  Recent Lipid Panel Total cholesterol 160, triglycerides 72, HDL 31, calculated LDL 115, creatinine 0.96, sodium 137, potassium 5.0, hemoglobin 11.6, TSH 0.051, normal liver function tests (labs performed February 6 at [facility])  Physical Exam:    VS:  BP 97/68   Pulse 78   Ht 5\' 4"  (1.626 m)   Wt 134 lb 3.2 oz (60.9 kg)   BMI 23.04 kg/m    Recheck BP 105/72 mmHg  Wt Readings from Last 3 Encounters:  06/01/18 134 lb 3.2 oz (60.9 kg)  05/13/18 133 lb 9.6 oz (60.6 kg)  03/31/18 136 lb 0.4 oz (61.7 kg)     General: Alert, oriented x3, no distress, lean Head: no evidence of trauma, PERRL, EOMI, no exophtalmos or lid lag, no myxedema, no xanthelasma; normal ears, nose and oropharynx Neck: normal jugular venous pulsations and no hepatojugular reflux; brisk carotid pulses without delay and no carotid bruits Chest: clear to auscultation, no signs of consolidation by percussion or palpation, normal fremitus, symmetrical and full respiratory excursions Cardiovascular: normal position and quality of the apical impulse, regular rhythm, normal first and second heart  sounds, no murmurs, rubs or gallops Abdomen: no tenderness or distention, no masses by palpation, no abnormal pulsatility or arterial bruits, normal bowel sounds, no hepatosplenomegaly Extremities: no clubbing, cyanosis or edema; 2+ radial, ulnar and brachial pulses bilaterally; 2+ right femoral, posterior tibial and dorsalis pedis pulses; 2+ left femoral, posterior tibial and dorsalis pedis pulses; no subclavian or femoral bruits Neurological: grossly nonfocal Psych: Normal mood and affect    ASSESSMENT:    1. Chronic combined systolic and diastolic congestive heart failure ([place])   2. Essential hypertension   3. Multiple thyroid nodules   4. Duodenal ulcer   5. Medication management    PLAN:    In order of problems listed above:  1. CHF:  Appears to be euvolemic.  "Dry weight" appears to be in the mid 130 range.  Try to increase her angiotensin receptor blocker further 50 mg twice daily, although her blood pressure might not tolerate it.  Blood pressure is relatively low in the office today, but her daughter states that at home it is occasionally high.  Asked her to call back if she develops dizziness or syncope.  Recheck labs in about a month. 2. HTN: Carvedilol and losartan 3. Hyperthyroidism/thyroid nodule: Work-up in progress final diagnosis not yet established 4. Duodenal ulcer/bleeding: Completed treatment for H. pylori.   Medication Adjustments/Labs and Tests Ordered: Current medicines are reviewed at length with the patient today.  Concerns regarding medicines are outlined above.  Orders Placed This Encounter  Procedures  . Basic metabolic panel   Meds ordered this encounter  Medications  . losartan (COZAAR) 50 MG tablet    Sig: Take 1 tablet (50 mg total) by mouth 2 (two) times daily.    Dispense:  180 tablet    Refill:  3    Signed, Sanda Klein, MD  06/01/2018 9:09 AM    [place]

## 2018-06-03 ENCOUNTER — Encounter (HOSPITAL_COMMUNITY): Payer: Self-pay

## 2018-06-08 DIAGNOSIS — E042 Nontoxic multinodular goiter: Secondary | ICD-10-CM | POA: Diagnosis not present

## 2018-06-08 DIAGNOSIS — D649 Anemia, unspecified: Secondary | ICD-10-CM | POA: Diagnosis not present

## 2018-06-08 DIAGNOSIS — Z1231 Encounter for screening mammogram for malignant neoplasm of breast: Secondary | ICD-10-CM | POA: Diagnosis not present

## 2018-06-08 DIAGNOSIS — E0590 Thyrotoxicosis, unspecified without thyrotoxic crisis or storm: Secondary | ICD-10-CM | POA: Diagnosis not present

## 2018-06-08 DIAGNOSIS — Z853 Personal history of malignant neoplasm of breast: Secondary | ICD-10-CM | POA: Diagnosis not present

## 2018-06-08 DIAGNOSIS — I10 Essential (primary) hypertension: Secondary | ICD-10-CM | POA: Diagnosis not present

## 2018-06-08 DIAGNOSIS — K922 Gastrointestinal hemorrhage, unspecified: Secondary | ICD-10-CM | POA: Diagnosis not present

## 2018-06-26 DIAGNOSIS — E0590 Thyrotoxicosis, unspecified without thyrotoxic crisis or storm: Secondary | ICD-10-CM | POA: Diagnosis not present

## 2018-07-24 DIAGNOSIS — E0590 Thyrotoxicosis, unspecified without thyrotoxic crisis or storm: Secondary | ICD-10-CM | POA: Diagnosis not present

## 2018-07-24 DIAGNOSIS — E042 Nontoxic multinodular goiter: Secondary | ICD-10-CM | POA: Diagnosis not present

## 2018-09-07 DIAGNOSIS — E0590 Thyrotoxicosis, unspecified without thyrotoxic crisis or storm: Secondary | ICD-10-CM | POA: Diagnosis not present

## 2018-09-07 DIAGNOSIS — I5022 Chronic systolic (congestive) heart failure: Secondary | ICD-10-CM | POA: Diagnosis not present

## 2018-09-07 DIAGNOSIS — E042 Nontoxic multinodular goiter: Secondary | ICD-10-CM | POA: Diagnosis not present

## 2018-09-07 DIAGNOSIS — D649 Anemia, unspecified: Secondary | ICD-10-CM | POA: Diagnosis not present

## 2018-09-07 DIAGNOSIS — I10 Essential (primary) hypertension: Secondary | ICD-10-CM | POA: Diagnosis not present

## 2018-12-18 ENCOUNTER — Ambulatory Visit: Payer: Medicare Other | Admitting: Cardiovascular Disease

## 2018-12-21 ENCOUNTER — Encounter: Payer: Self-pay | Admitting: *Deleted

## 2019-03-10 ENCOUNTER — Telehealth: Payer: Self-pay | Admitting: *Deleted

## 2019-03-10 NOTE — Telephone Encounter (Signed)
A message was left, re: follow up visit. 

## 2019-04-05 DIAGNOSIS — E0590 Thyrotoxicosis, unspecified without thyrotoxic crisis or storm: Secondary | ICD-10-CM | POA: Diagnosis not present

## 2019-04-05 DIAGNOSIS — I10 Essential (primary) hypertension: Secondary | ICD-10-CM | POA: Diagnosis not present

## 2019-04-05 DIAGNOSIS — I5022 Chronic systolic (congestive) heart failure: Secondary | ICD-10-CM | POA: Diagnosis not present

## 2019-04-05 DIAGNOSIS — D649 Anemia, unspecified: Secondary | ICD-10-CM | POA: Diagnosis not present

## 2019-04-05 DIAGNOSIS — Z1389 Encounter for screening for other disorder: Secondary | ICD-10-CM | POA: Diagnosis not present

## 2019-04-05 DIAGNOSIS — E042 Nontoxic multinodular goiter: Secondary | ICD-10-CM | POA: Diagnosis not present

## 2019-04-05 DIAGNOSIS — Z0000 Encounter for general adult medical examination without abnormal findings: Secondary | ICD-10-CM | POA: Diagnosis not present

## 2019-04-29 ENCOUNTER — Telehealth: Payer: Self-pay | Admitting: *Deleted

## 2019-04-29 NOTE — Telephone Encounter (Signed)
Message left on the daughter's voicemail that the appointment for tomorrow would be a virtual visit.

## 2019-04-29 NOTE — Telephone Encounter (Signed)
Left a message for the patient to call back to change the appointment for tomorrow to a virtual visit.

## 2019-04-30 ENCOUNTER — Telehealth: Payer: Medicare Other | Admitting: Cardiovascular Disease

## 2019-04-30 ENCOUNTER — Telehealth: Payer: Self-pay | Admitting: *Deleted

## 2019-04-30 NOTE — Telephone Encounter (Signed)

## 2019-05-03 ENCOUNTER — Ambulatory Visit (INDEPENDENT_AMBULATORY_CARE_PROVIDER_SITE_OTHER): Payer: Medicare Other | Admitting: Cardiovascular Disease

## 2019-05-03 ENCOUNTER — Other Ambulatory Visit: Payer: Self-pay

## 2019-05-03 ENCOUNTER — Encounter: Payer: Self-pay | Admitting: Cardiovascular Disease

## 2019-05-03 VITALS — BP 138/71 | HR 73 | Ht 66.0 in | Wt 147.0 lb

## 2019-05-03 DIAGNOSIS — D649 Anemia, unspecified: Secondary | ICD-10-CM

## 2019-05-03 DIAGNOSIS — I5042 Chronic combined systolic (congestive) and diastolic (congestive) heart failure: Secondary | ICD-10-CM

## 2019-05-03 DIAGNOSIS — I10 Essential (primary) hypertension: Secondary | ICD-10-CM

## 2019-05-03 DIAGNOSIS — D62 Acute posthemorrhagic anemia: Secondary | ICD-10-CM | POA: Diagnosis not present

## 2019-05-03 DIAGNOSIS — E0590 Thyrotoxicosis, unspecified without thyrotoxic crisis or storm: Secondary | ICD-10-CM | POA: Diagnosis not present

## 2019-05-03 DIAGNOSIS — E042 Nontoxic multinodular goiter: Secondary | ICD-10-CM

## 2019-05-03 LAB — CBC
Hematocrit: 33.3 % — ABNORMAL LOW (ref 34.0–46.6)
Hemoglobin: 11.1 g/dL (ref 11.1–15.9)
MCH: 29.6 pg (ref 26.6–33.0)
MCHC: 33.3 g/dL (ref 31.5–35.7)
MCV: 89 fL (ref 79–97)
Platelets: 220 10*3/uL (ref 150–450)
RBC: 3.75 x10E6/uL — ABNORMAL LOW (ref 3.77–5.28)
RDW: 12.2 % (ref 11.7–15.4)
WBC: 6.6 10*3/uL (ref 3.4–10.8)

## 2019-05-03 LAB — BASIC METABOLIC PANEL
BUN/Creatinine Ratio: 20 (ref 12–28)
BUN: 23 mg/dL (ref 8–27)
CO2: 26 mmol/L (ref 20–29)
Calcium: 9.3 mg/dL (ref 8.7–10.3)
Chloride: 100 mmol/L (ref 96–106)
Creatinine, Ser: 1.16 mg/dL — ABNORMAL HIGH (ref 0.57–1.00)
GFR calc Af Amer: 50 mL/min/{1.73_m2} — ABNORMAL LOW (ref >59)
GFR calc non Af Amer: 43 mL/min/{1.73_m2} — ABNORMAL LOW (ref >59)
Glucose: 75 mg/dL (ref 65–99)
Potassium: 5 mmol/L (ref 3.5–5.2)
Sodium: 139 mmol/L (ref 134–144)

## 2019-05-03 LAB — TSH: TSH: 0.45 u[IU]/mL (ref 0.450–4.500)

## 2019-05-03 MED ORDER — POTASSIUM CHLORIDE CRYS ER 20 MEQ PO TBCR: EXTENDED_RELEASE_TABLET | ORAL | Status: DC

## 2019-05-03 MED ORDER — FUROSEMIDE 40 MG PO TABS: ORAL_TABLET | ORAL | Status: DC

## 2019-05-03 NOTE — Progress Notes (Signed)
Cardiology office note:    Date:  05/03/2019   ID:  Diane Ross, DOB [birth year], MRN [id number]  PCP:  Diane Rossetti, NP (Inactive)  Cardiologist:  New  Referring MD: Diane Rossetti, NP  No chief complaint on file.    History of Present Illness:    Diane Ross is a 85 y.o. female with a hx of long-standing hypertension, systolic heart failure, returning in follow-up.  She is doing well.  She has gained back a little bit of weight and is now in optimal weight range.  She denies any cardiovascular complaints and has not required hospitalization or diuretic dose adjustment.  In fact if she inadvertently skips her diuretic, she does not develop dyspnea or edema.   Systolic blood pressure is slightly high today, but on June 22 her blood pressure was 121/62 in the doctor's office.  Last EF assessment was echo in April 2019, 50-55%, little improved compared to [facility] in February, EF 45% (No perfusion abnormalities were seen).    She was hospitalized in June 2019 with a bleeding duodenal ulcer and was treated for H. pylori infection.  She has not had any overt bleeding since.  Her most recent hemoglobin checked in November 2019 was still low at 10.8.  Thyroid nodule fine-needle aspiration showed atypical findings of uncertain significance on 05/20/2018.  TSH has not been repeated.  She has not smoked since 1954.  She has a history of left breast cancer treated with lumpectomy.  She is not certain, but believes she may have received chemotherapy.  She does not recognize the words Adriamycin or doxorubicin.  She has previously undergone hysterectomy.  Past Medical History:  Diagnosis Date  . Breast cancer ([place])    left side  . Hypertension   . Thyroid disorder     Past Surgical History:  Procedure Laterality Date  . ABDOMINAL HYSTERECTOMY    . BREAST LUMPECTOMY Left   . ESOPHAGOGASTRODUODENOSCOPY N/A 03/29/2018   Procedure: ESOPHAGOGASTRODUODENOSCOPY (EGD);  Surgeon:  Carol Ada, MD;  Location: [place];  Service: Endoscopy;  Laterality: N/A;    Current Medications: Current Meds  Medication Sig  . carboxymethylcellulose (REFRESH PLUS) 0.5 % SOLN Place 2 drops into both eyes 3 (three) times daily as needed (for dry eyes).  . carvedilol (COREG) 6.25 MG tablet Take 1 tablet (6.25 mg total) by mouth 2 (two) times daily with a meal.  . furosemide (LASIX) 40 MG tablet Take one tablet, 40 mg, three days a week: Monday, Wednesday and Friday  . losartan (COZAAR) 50 MG tablet Take 1 tablet (50 mg total) by mouth 2 (two) times daily.  . Multiple Vitamin (MULTIVITAMIN) tablet Take 1 tablet by mouth daily.  . pantoprazole (PROTONIX) 40 MG tablet Take 1 tablet (40 mg total) by mouth daily.  . potassium chloride SA (K-DUR) 20 MEQ tablet Take one tablet on Monday, Wednesday and Friday  . vitamin B-12 (CYANOCOBALAMIN) 100 MCG tablet Take 100 mcg by mouth daily.  . [DISCONTINUED] furosemide (LASIX) 40 MG tablet Take 40 mg by mouth daily.  . [DISCONTINUED] potassium chloride SA (K-DUR,KLOR-CON) 20 MEQ tablet Take 20 mEq by mouth daily.     Allergies:   Patient has no known allergies.   Social History   Socioeconomic History  . Marital status: Widowed    Spouse name: Not on file  . Number of children: Not on file  . Years of education: Not on file  . Highest education level: Not on file  Occupational History  .  Occupation: retired  Scientific laboratory technician  . Financial resource strain: Not on file  . Food insecurity    Worry: Not on file    Inability: Not on file  . Transportation needs    Medical: Not on file    Non-medical: Not on file  Tobacco Use  . Smoking status: Former Smoker    Years: 55.00  . Smokeless tobacco: Never Used  Substance and Sexual Activity  . Alcohol use: No    Frequency: Never  . Drug use: No  . Sexual activity: Never  Lifestyle  . Physical activity    Days per week: Not on file    Minutes per session: Not on file  . Stress: Not on  file  Relationships  . Social Herbalist on phone: Not on file    Gets together: Not on file    Attends religious service: Not on file    Active member of club or organization: Not on file    Attends meetings of clubs or organizations: Not on file    Relationship status: Not on file  Other Topics Concern  . Not on file  Social History Narrative  . Not on file     Family History: The patient's mother had diabetes.   1 of her brothers had diabetes and drug issues and has passed away.  Another brother is alive and well at age 31.  She has a healthy sister.  2 of her daughters have hypertension there does not appear to be a prominent family history of coronary disease. ROS:   Please see the history of present illness.    All other systems are reviewed and are negative  EKGs/Labs/Other Studies Reviewed:    EKG:  EKG is ordered today.  Shows sinus rhythm with occasional PACs but is otherwise normal tracing.  Recent Labs: 05/13/2018: Hemoglobin 10.5; Platelets 244.0  Recent Lipid Panel Total cholesterol 160, triglycerides 72, HDL 31, calculated LDL 115, creatinine 0.96, sodium 137, potassium 5.0, hemoglobin 11.6, TSH 0.051, normal liver function tests (labs performed February 6 at [facility])  September 07, 2018 hemoglobin 10.8, creatinine 1.21, potassium 5.3,  TSH 0.335  Lipid Panel  No results found for: CHOL, TRIG, HDL, CHOLHDL, VLDL, LDLCALC, LDLDIRECT February 2019 cholesterol 160, HDL 31, LDL 115, triglycerides 72   Physical Exam:    VS:  BP 138/71   Pulse 73   Ht 5\' 6"  (1.676 m)   Wt 147 lb (66.7 kg)   BMI 23.73 kg/m    Recheck BP 105/72 mmHg  Wt Readings from Last 3 Encounters:  05/03/19 147 lb (66.7 kg)  06/01/18 134 lb 3.2 oz (60.9 kg)  05/13/18 133 lb 9.6 oz (60.6 kg)     General: Alert, oriented x3, no distress, appears well and does not look particularly pale (hard to tell due to skin pigmentation). Head: no evidence of trauma, PERRL, EOMI, no  exophtalmos or lid lag, no myxedema, no xanthelasma; normal ears, nose and oropharynx Neck: normal jugular venous pulsations and no hepatojugular reflux; brisk carotid pulses without delay and no carotid bruits Chest: clear to auscultation, no signs of consolidation by percussion or palpation, normal fremitus, symmetrical and full respiratory excursions Cardiovascular: normal position and quality of the apical impulse, regular rhythm, normal first and second heart sounds, no murmurs, rubs or gallops Abdomen: no tenderness or distention, no masses by palpation, no abnormal pulsatility or arterial bruits, normal bowel sounds, no hepatosplenomegaly Extremities: no clubbing, cyanosis or edema; 2+ radial, ulnar  and brachial pulses bilaterally; 2+ right femoral, posterior tibial and dorsalis pedis pulses; 2+ left femoral, posterior tibial and dorsalis pedis pulses; no subclavian or femoral bruits Neurological: grossly nonfocal Psych: Normal mood and affect    ASSESSMENT:    1. Anemia, unspecified type   2. Hyperthyroidism   3. Essential hypertension    PLAN:    In order of problems listed above:  1. CHF: Clinically appears to be euvolemic.  She has gained some real weight so her previous assessment of her "dry weight is an accurate.  On moderate doses of carvedilol and losartan.  Typical blood pressure at home is lower than today.  I think she can try to take her furosemide on an intermittent basis.  Suggested that she take it only 3 days a week Monday Wednesday Friday and also do the same with her potassium supplement. 2. HTN: Controlled on carvedilol and losartan 3. Hyperthyroidism/thyroid nodule: Recheck TSH 4. Anemia/history of bleeding duodenal ulcer: Check CBC today.   Medication Adjustments/Labs and Tests Ordered: Current medicines are reviewed at length with the patient today.  Concerns regarding medicines are outlined above.  Orders Placed This Encounter  Procedures  . Basic  metabolic panel  . TSH  . CBC  . EKG 12-Lead   Meds ordered this encounter  Medications  . furosemide (LASIX) 40 MG tablet    Sig: Take one tablet, 40 mg, three days a week: Monday, Wednesday and Friday    Dispense:  30 tablet  . potassium chloride SA (K-DUR) 20 MEQ tablet    Sig: Take one tablet on Monday, Wednesday and Friday    Signed, Sanda Klein, MD  05/03/2019 9:53 AM    [place]

## 2019-05-03 NOTE — Patient Instructions (Addendum)
Medication Instructions:  CHANGE how you take the Furosemide to three days a week: Monday, Wednesday and Friday Change how you take the Potassium to three days a week: Monday, Wednesday and Friday.  If you need a refill on your cardiac medications before your next appointment, please call your pharmacy.   Lab work: Your provider would like for you to have the following labs today: BMET, CBC and TSH  If you have labs (blood work) drawn today and your tests are completely normal, you will receive your results only by: [place] (if you have MyChart) OR A paper copy in the mail If you have any lab test that is abnormal or we need to change your treatment, we will call you to review the results.  Testing/Procedures: None ordered  Follow-Up: At [facility], you and your health needs are our priority.  As part of our continuing mission to provide you with exceptional heart care, we have created designated Provider Care Teams.  These Care Teams include your primary Cardiologist (physician) and Advanced Practice Providers (APPs -  Physician Assistants and Nurse Practitioners) who all work together to provide you with the care you need, when you need it. You will need a follow up appointment in 12 months.  Please call our office 2 months in advance to schedule this appointment.  You may see Sanda Klein, MD or one of the following Advanced Practice Providers on your designated Care Team: Almyra Deforest, PA-C Fabian Sharp, [place]

## 2019-05-04 ENCOUNTER — Encounter: Payer: Self-pay | Admitting: *Deleted

## 2019-06-05 ENCOUNTER — Other Ambulatory Visit: Payer: Self-pay | Admitting: Gastroenterology

## 2019-06-10 IMAGING — DX DG CHEST 2V
2 series · 2 of 2 positions shown · non-contrast
Comparison: None.

CLINICAL DATA: Elevated troponin

EXAM:
CHEST  2 VIEW

[w chest pa]
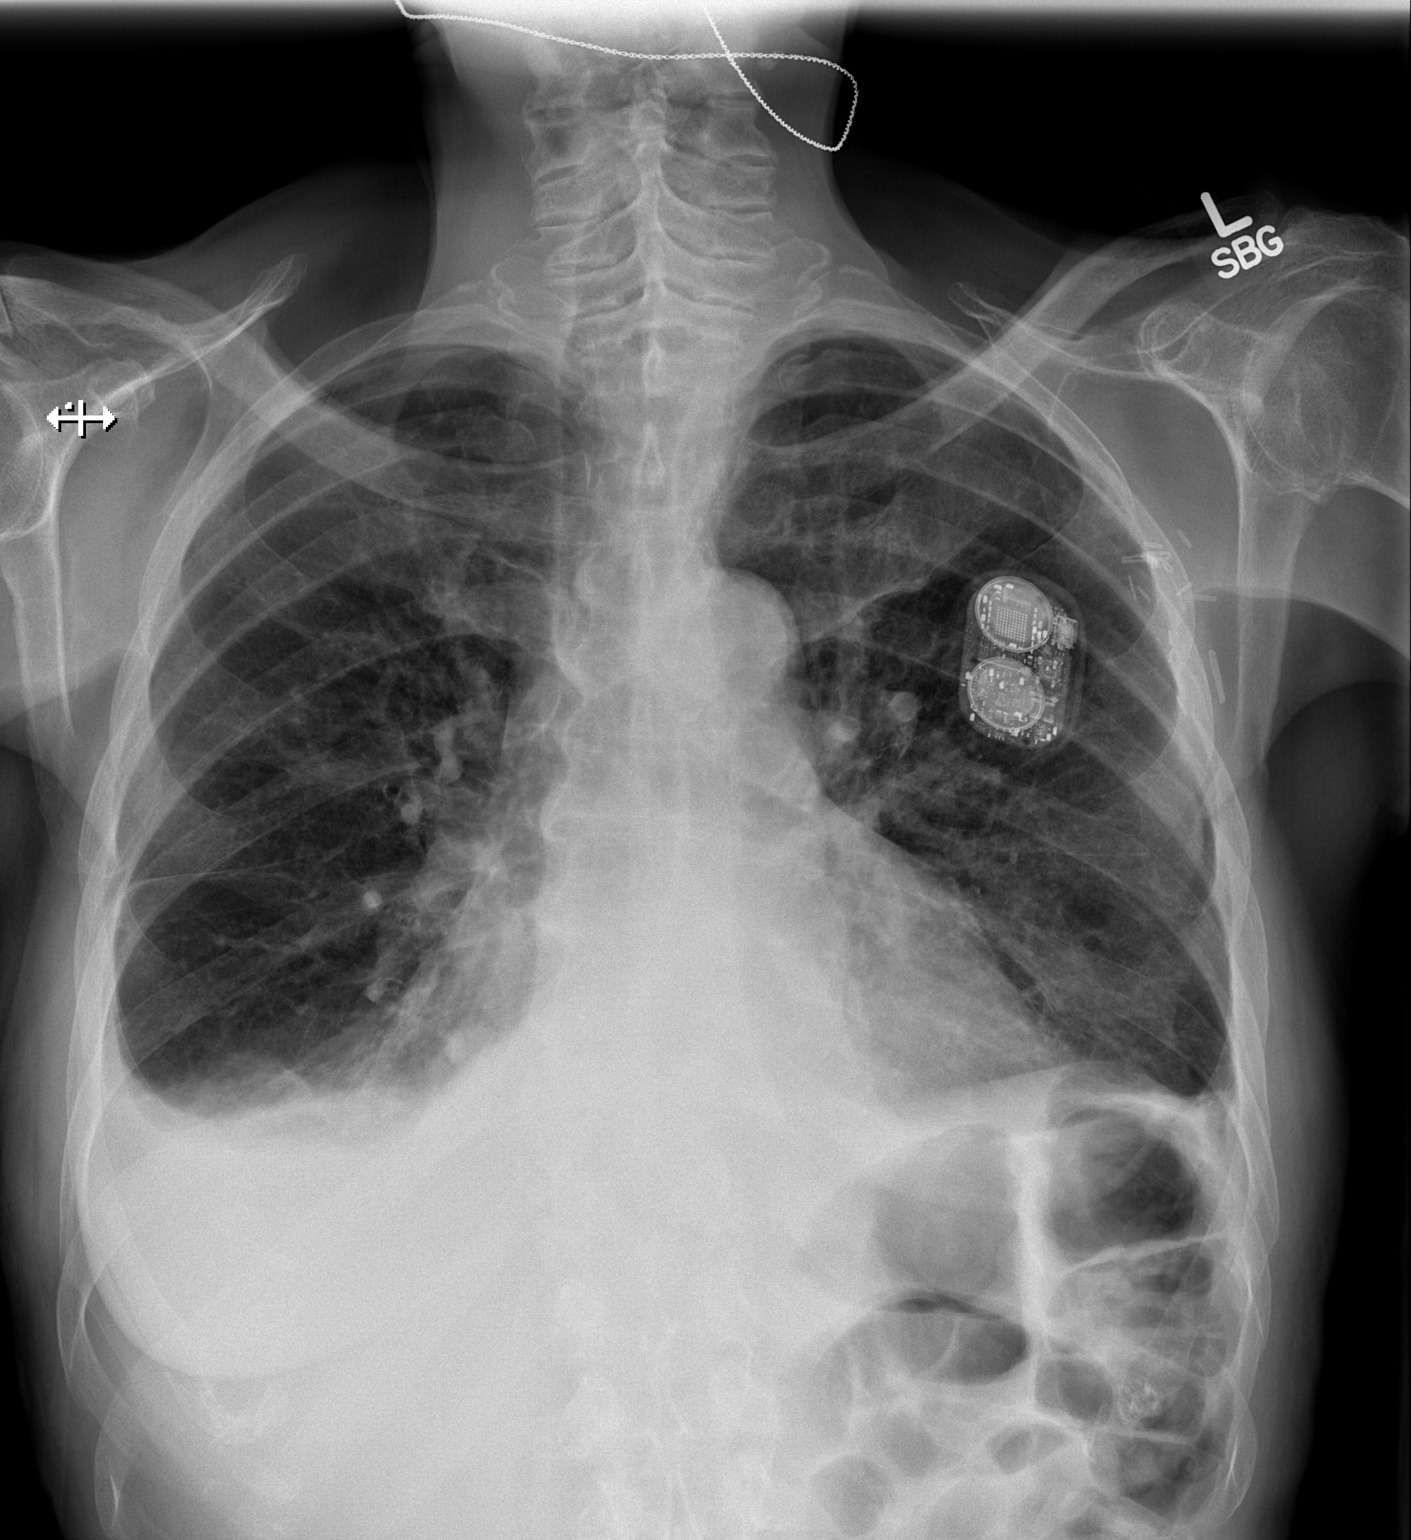

[w chest lat]
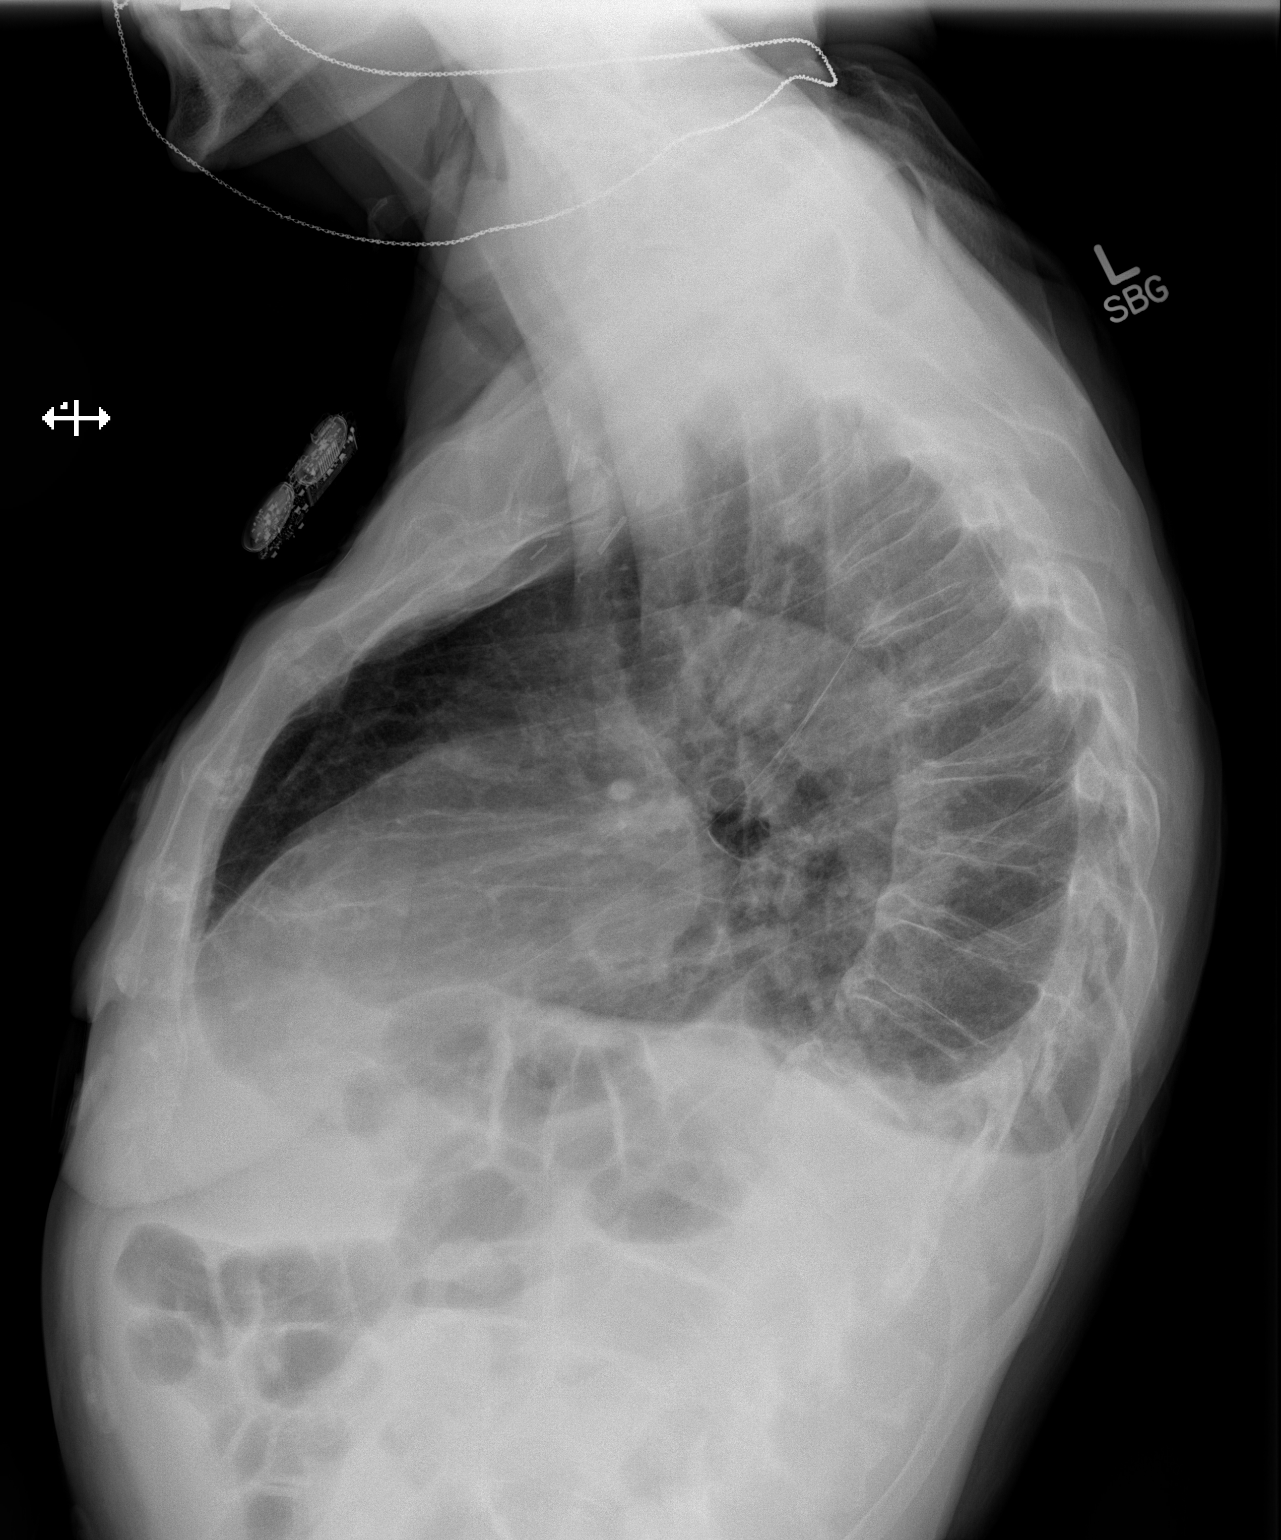

[2 of 2 positions shown; findings below may reference images not displayed]

FINDINGS: Small bilateral pleural effusions and mild bibasilar atelectasis.
Mild vascular congestion. Negative for edema.
IMPRESSION: Small bilateral effusions and bibasilar atelectasis right greater
than left. Negative for edema.

## 2019-06-14 DIAGNOSIS — I10 Essential (primary) hypertension: Secondary | ICD-10-CM | POA: Diagnosis not present

## 2019-06-14 DIAGNOSIS — Z853 Personal history of malignant neoplasm of breast: Secondary | ICD-10-CM | POA: Diagnosis not present

## 2019-06-14 DIAGNOSIS — I480 Paroxysmal atrial fibrillation: Secondary | ICD-10-CM | POA: Diagnosis not present

## 2019-06-14 DIAGNOSIS — Z0000 Encounter for general adult medical examination without abnormal findings: Secondary | ICD-10-CM | POA: Diagnosis not present

## 2019-06-14 DIAGNOSIS — Z13220 Encounter for screening for lipoid disorders: Secondary | ICD-10-CM | POA: Diagnosis not present

## 2019-06-14 DIAGNOSIS — D649 Anemia, unspecified: Secondary | ICD-10-CM | POA: Diagnosis not present

## 2019-06-14 DIAGNOSIS — Z1389 Encounter for screening for other disorder: Secondary | ICD-10-CM | POA: Diagnosis not present

## 2019-06-14 DIAGNOSIS — I5022 Chronic systolic (congestive) heart failure: Secondary | ICD-10-CM | POA: Diagnosis not present

## 2019-06-14 DIAGNOSIS — Z1231 Encounter for screening mammogram for malignant neoplasm of breast: Secondary | ICD-10-CM | POA: Diagnosis not present

## 2019-06-14 DIAGNOSIS — E0590 Thyrotoxicosis, unspecified without thyrotoxic crisis or storm: Secondary | ICD-10-CM | POA: Diagnosis not present

## 2019-06-14 DIAGNOSIS — E042 Nontoxic multinodular goiter: Secondary | ICD-10-CM | POA: Diagnosis not present

## 2019-07-18 IMAGING — US US FNA BIOPSY THYROID 1ST LESION
1 series · 13 of 14 positions shown · non-contrast
Comparison: US Thyroid 11/26/17

MEDICATIONS:
5 cc 1% lidocaine

COMPLICATIONS:
None immediate.

INDICATION: Indeterminate thyroid nodule

Right thyroid nodule
EXAM:
ULTRASOUND GUIDED FINE NEEDLE ASPIRATION OF INDETERMINATE THYROID
NODULE
TECHNIQUE: Informed written consent was obtained from the patient after a
discussion of the risks, benefits and alternatives to treatment.
Questions regarding the procedure were encouraged and answered. A
timeout was performed prior to the initiation of the procedure.

[Series 1: us fna biopsy thyroid 1st lesion · 0.07mm/px · 14 acquisitions, 13 frames shown]
[im 1/14]
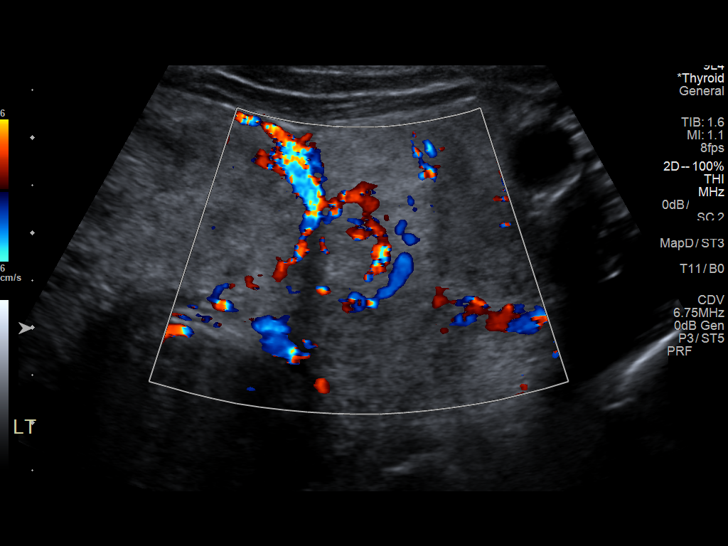
[im 2/14]
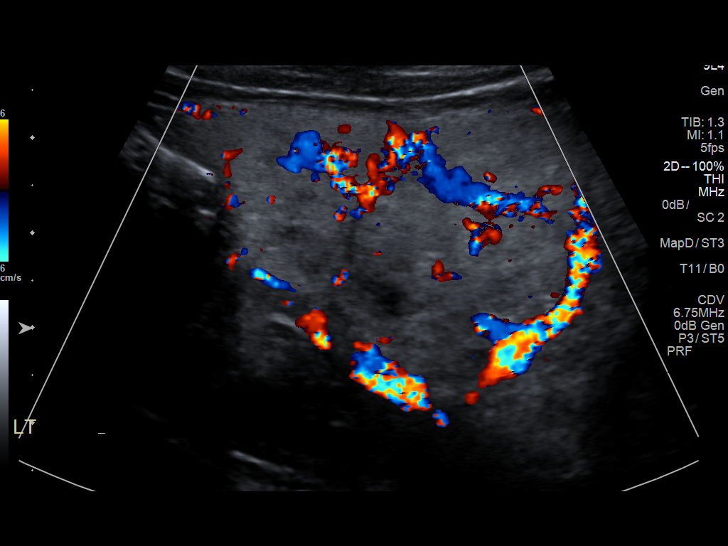
[im 3/14]
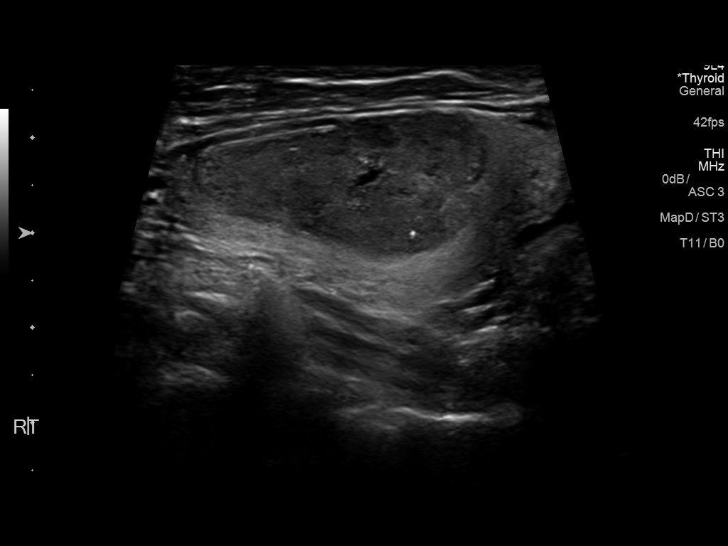
[im 4/14]
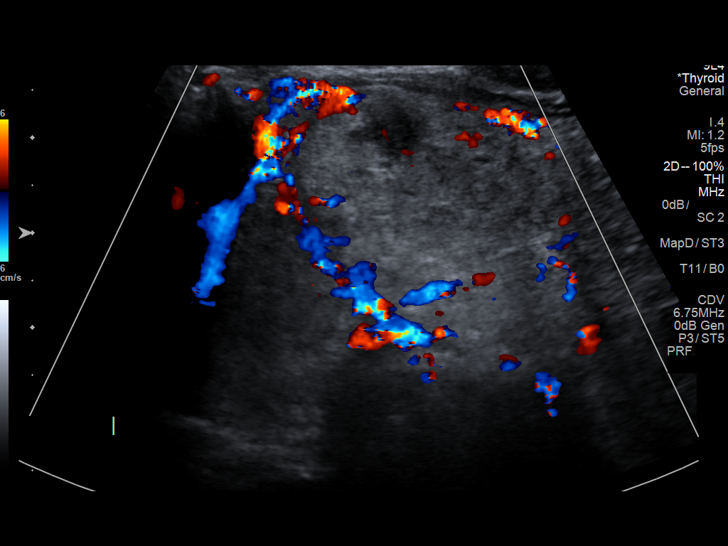
[im 5/14]
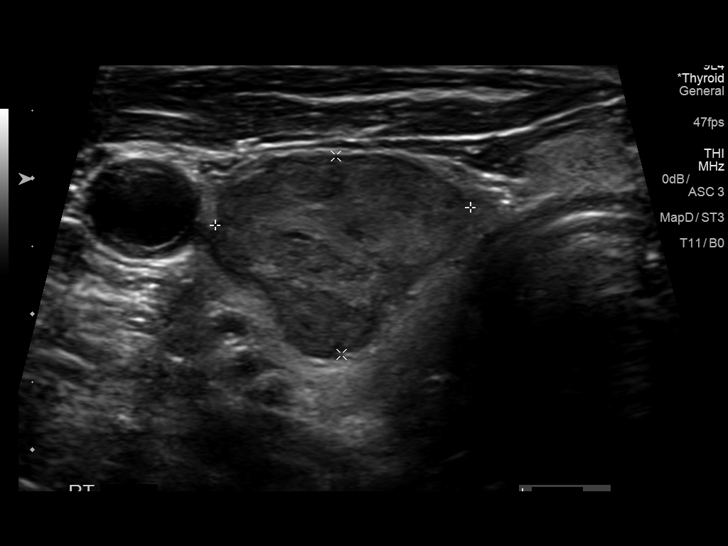
[im 6/14]
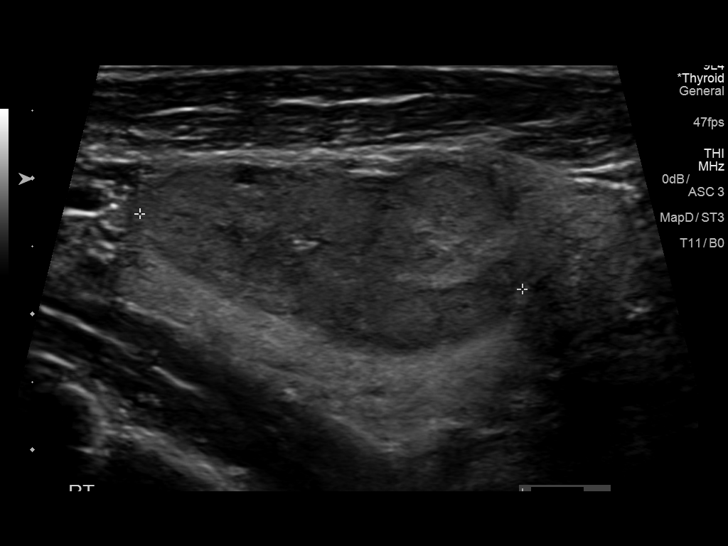
[im 8/14]
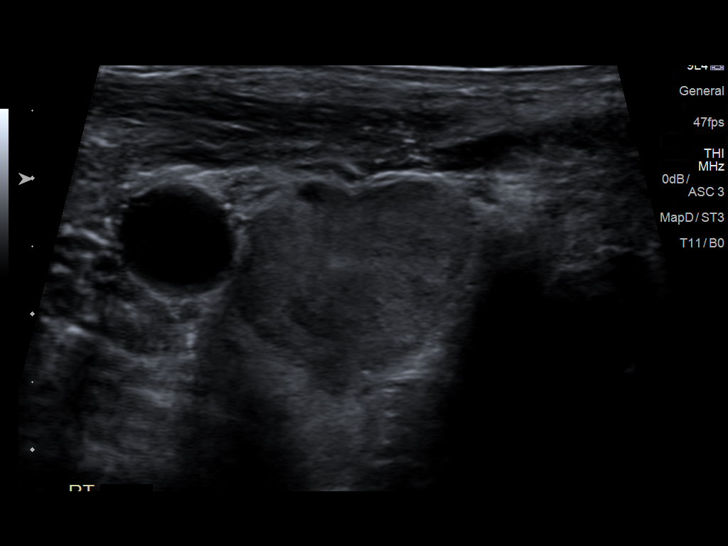
[im 9/14]
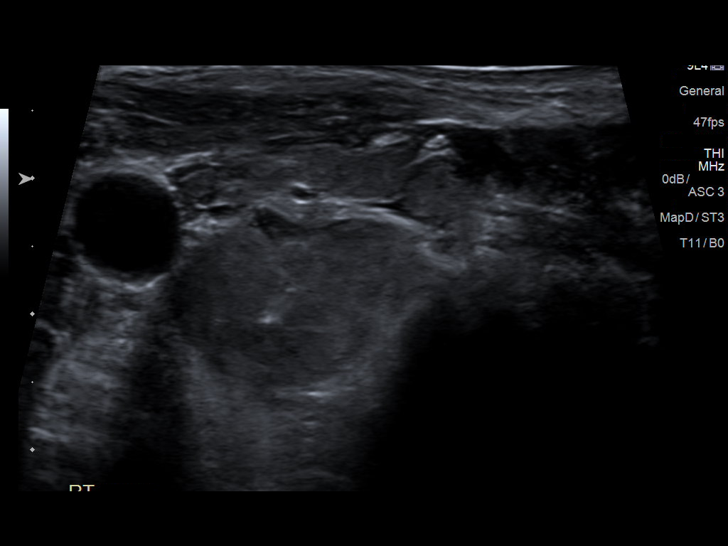
[im 10/14]
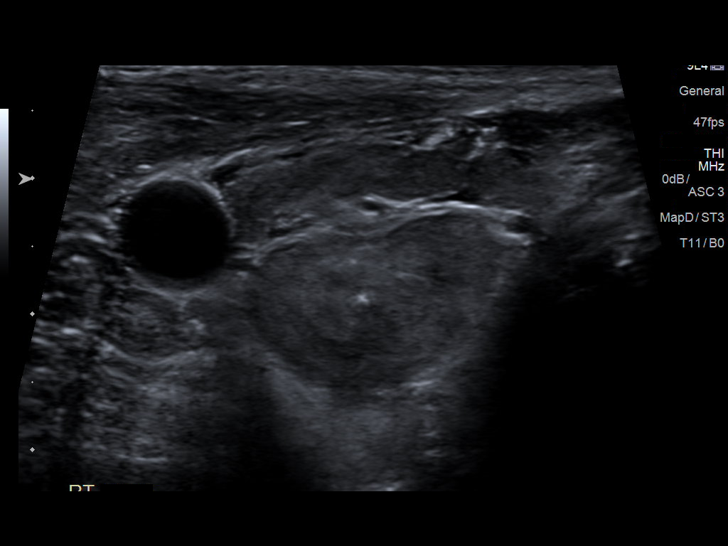
[im 11/14]
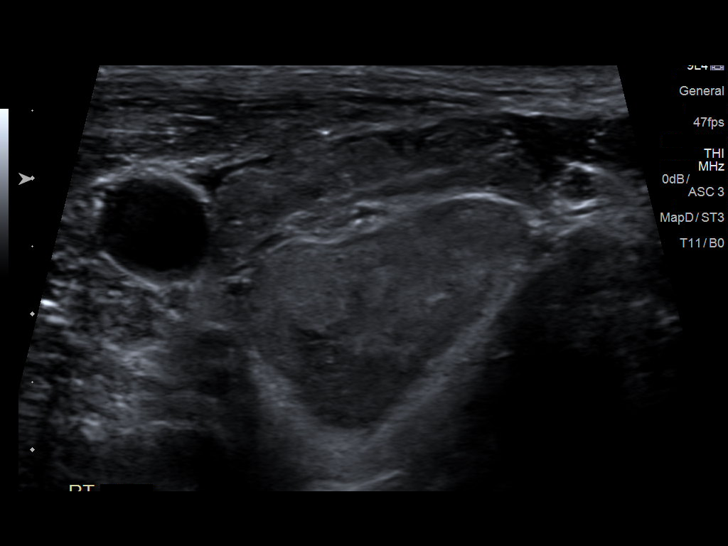
[im 12/14]
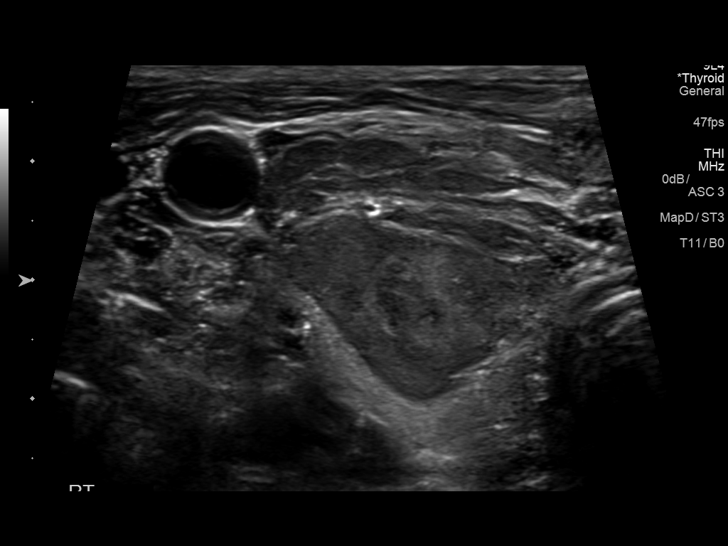
[im 13/14]
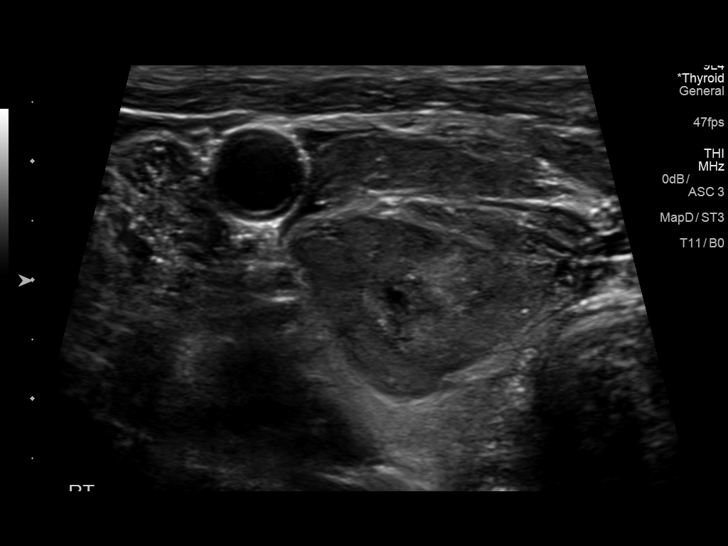
[im 14/14]
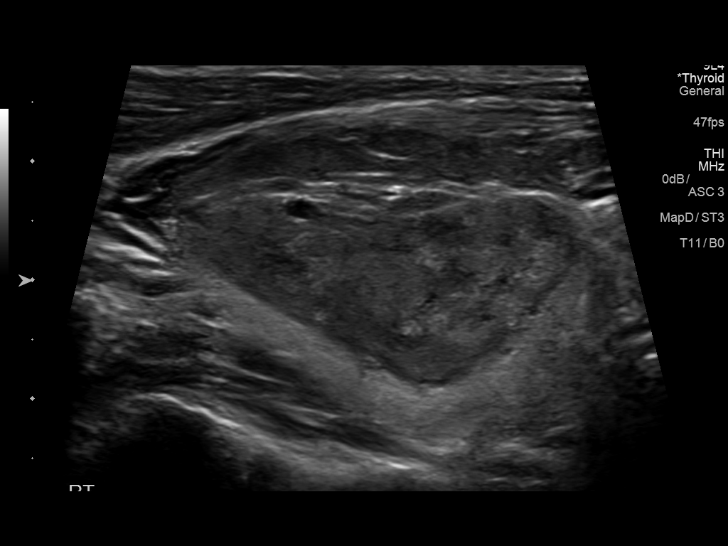

[13 of 14 positions shown; findings below may reference images not displayed]

Pre-procedural ultrasound scanning demonstrated unchanged size and
appearance of the indeterminate nodule within the right thyroid

The procedure was planned. The neck was prepped in the usual sterile
fashion, and a sterile drape was applied covering the operative
field. A timeout was performed prior to the initiation of the
procedure. Local anesthesia was provided with 1% lidocaine.

Under direct ultrasound guidance, 5 FNA biopsies were performed of
the right thyroid nodule with a 25 gauge needle.

2 of these samples were obtained for potential AFIRMA testing.

Multiple ultrasound images were saved for procedural documentation
purposes. The samples were prepared and submitted to pathology.

Limited post procedural scanning was positive for small asymptomatic
hematoma. Dressings were placed. The patient tolerated the above
procedures procedure well without immediate postprocedural
complication.
FINDINGS: Real-time sonographic evaluation performed by co-signing
interventional radiologist failed delineate the questioned worrisome
nodule within the thyroid isthmus (labeled 2) or the worrisome
nodules within the left lobe of the thyroid (labeled 4, 5 and 6).
These questioned nodules appear to represent pseudo nodules as they
lack defined borders on both the provided sagittal and trans images
with apparent borders actually being intervening blood vessels in
the background of asymmetric enlargement left lobe of the thyroid.

As such the only definitive nodule was located within the right lobe
of the thyroid (labeled 3) and as this nodule meets imaging
criteria, was subsequently biopsied.

Nodule reference number based on prior diagnostic ultrasound: 3

Maximum size:

Location: Right; Mid

ACR TI-RADS risk category: TR4 (4-6 points)

Reason for biopsy: meets ACR TI-RADS criteria

Ultrasound imaging confirms appropriate placement of the needles
within the thyroid nodule.
IMPRESSION: 1. Technically successful ultrasound guided fine needle aspiration
of right thyroid nodule
2. Pseudo nodules within the isthmus and left lobe of the thyroid as
detailed above.

Read by

Madonna Valladares

## 2019-07-26 DIAGNOSIS — E0590 Thyrotoxicosis, unspecified without thyrotoxic crisis or storm: Secondary | ICD-10-CM | POA: Diagnosis not present

## 2019-07-26 DIAGNOSIS — R413 Other amnesia: Secondary | ICD-10-CM | POA: Diagnosis not present

## 2019-07-26 DIAGNOSIS — E7800 Pure hypercholesterolemia, unspecified: Secondary | ICD-10-CM | POA: Diagnosis not present

## 2019-08-06 DIAGNOSIS — E042 Nontoxic multinodular goiter: Secondary | ICD-10-CM | POA: Diagnosis not present

## 2019-08-06 DIAGNOSIS — E0590 Thyrotoxicosis, unspecified without thyrotoxic crisis or storm: Secondary | ICD-10-CM | POA: Diagnosis not present

## 2019-08-08 ENCOUNTER — Other Ambulatory Visit: Payer: Self-pay | Admitting: Gastroenterology

## 2019-10-29 DIAGNOSIS — R634 Abnormal weight loss: Secondary | ICD-10-CM | POA: Diagnosis not present

## 2019-10-29 DIAGNOSIS — N1831 Chronic kidney disease, stage 3a: Secondary | ICD-10-CM | POA: Diagnosis not present

## 2019-10-29 DIAGNOSIS — I480 Paroxysmal atrial fibrillation: Secondary | ICD-10-CM | POA: Diagnosis not present

## 2019-10-29 DIAGNOSIS — I10 Essential (primary) hypertension: Secondary | ICD-10-CM | POA: Diagnosis not present

## 2019-11-01 DIAGNOSIS — H1045 Other chronic allergic conjunctivitis: Secondary | ICD-10-CM | POA: Diagnosis not present

## 2019-11-01 DIAGNOSIS — H43811 Vitreous degeneration, right eye: Secondary | ICD-10-CM | POA: Diagnosis not present

## 2019-11-01 DIAGNOSIS — H2513 Age-related nuclear cataract, bilateral: Secondary | ICD-10-CM | POA: Diagnosis not present

## 2019-11-01 DIAGNOSIS — H25013 Cortical age-related cataract, bilateral: Secondary | ICD-10-CM | POA: Diagnosis not present

## 2019-12-13 DIAGNOSIS — I10 Essential (primary) hypertension: Secondary | ICD-10-CM | POA: Diagnosis not present

## 2019-12-13 DIAGNOSIS — Z853 Personal history of malignant neoplasm of breast: Secondary | ICD-10-CM | POA: Diagnosis not present

## 2019-12-13 DIAGNOSIS — N1831 Chronic kidney disease, stage 3a: Secondary | ICD-10-CM | POA: Diagnosis not present

## 2019-12-13 DIAGNOSIS — I480 Paroxysmal atrial fibrillation: Secondary | ICD-10-CM | POA: Diagnosis not present

## 2019-12-13 DIAGNOSIS — E0590 Thyrotoxicosis, unspecified without thyrotoxic crisis or storm: Secondary | ICD-10-CM | POA: Diagnosis not present

## 2019-12-13 DIAGNOSIS — E042 Nontoxic multinodular goiter: Secondary | ICD-10-CM | POA: Diagnosis not present

## 2020-05-26 ENCOUNTER — Encounter: Payer: Self-pay | Admitting: Cardiovascular Disease

## 2020-05-26 ENCOUNTER — Other Ambulatory Visit: Payer: Self-pay

## 2020-05-26 ENCOUNTER — Ambulatory Visit (INDEPENDENT_AMBULATORY_CARE_PROVIDER_SITE_OTHER): Payer: Medicare Other | Admitting: Cardiovascular Disease

## 2020-05-26 VITALS — BP 125/61 | HR 78 | Ht 65.0 in | Wt 132.5 lb

## 2020-05-26 DIAGNOSIS — I10 Essential (primary) hypertension: Secondary | ICD-10-CM

## 2020-05-26 NOTE — Progress Notes (Signed)
Cardiology office note:    Date:  05/26/2020   ID:  Diane Ross, DOB [birth year], MRN [id number]  PCP:  Diane Rossetti, NP (Inactive)  Cardiologist:  New  Referring MD: Diane Rossetti, NP  Chief Complaint  Patient presents with  . Congestive Heart Failure     History of Present Illness:    Diane Ross is a 86 y.o. female with a hx of long-standing hypertension, heart failure with mildly decreased systolic function, returning in follow-up.  She is doing quite well without any problems with edema or shortness of breath even though she only takes loop diuretics only 3 days a week.  She denies angina, dyspnea, palpitations, dizziness, syncope either at rest or with activity.  She continues to have moderately advanced memory  Last EF assessment was echo in April 2019, 50-55%, little improved compared to [place] in February, EF 45% (No perfusion abnormalities were seen).    She was hospitalized in June 2019 with a bleeding duodenal ulcer and was treated for H. pylori infection.  She has not had any overt bleeding since.  Her most recent hemoglobin checked in November 2019 was still low at 10.8.  Thyroid nodule fine-needle aspiration showed atypical findings of uncertain significance on 05/20/2018.  TSH has not been repeated.  She has not smoked since 1954.  She has a history of left breast cancer treated with lumpectomy.  She is not certain, but believes she may have received chemotherapy.  She does not recognize the words Adriamycin or doxorubicin.  She has previously undergone hysterectomy.  Past Medical History:  Diagnosis Date  . Breast cancer ([place])    left side  . Hypertension   . Thyroid disorder     Past Surgical History:  Procedure Laterality Date  . ABDOMINAL HYSTERECTOMY    . BREAST LUMPECTOMY Left   . ESOPHAGOGASTRODUODENOSCOPY N/A 03/29/2018   Procedure: ESOPHAGOGASTRODUODENOSCOPY (EGD);  Surgeon: Carol Ada, MD;  Location: [place];  Service:  Endoscopy;  Laterality: N/A;    Current Medications: Current Meds  Medication Sig  . carboxymethylcellulose (REFRESH PLUS) 0.5 % SOLN Place 2 drops into both eyes 3 (three) times daily as needed (for dry eyes).  . carvedilol (COREG) 6.25 MG tablet Take 1 tablet (6.25 mg total) by mouth 2 (two) times daily with a meal.  . furosemide (LASIX) 40 MG tablet Take one tablet, 40 mg, three days a week: Monday, Wednesday and Friday  . Multiple Vitamin (MULTIVITAMIN) tablet Take 1 tablet by mouth daily.  . pantoprazole (PROTONIX) 40 MG tablet Take 1 tablet by mouth once daily  . potassium chloride SA (K-DUR) 20 MEQ tablet Take one tablet on Monday, Wednesday and Friday  . vitamin B-12 (CYANOCOBALAMIN) 100 MCG tablet Take 100 mcg by mouth daily.     Allergies:   Patient has no known allergies.   Social History   Socioeconomic History  . Marital status: Widowed    Spouse name: Not on file  . Number of children: Not on file  . Years of education: Not on file  . Highest education level: Not on file  Occupational History  . Occupation: retired  Tobacco Use  . Smoking status: Former Smoker    Years: 55.00  . Smokeless tobacco: Never Used  Vaping Use  . Vaping Use: Never used  Substance and Sexual Activity  . Alcohol use: No  . Drug use: No  . Sexual activity: Never  Other Topics Concern  . Not on file  Social History Narrative  .  Not on file   Social Determinants of Health   Financial Resource Strain:   . Difficulty of Paying Living Expenses:   Food Insecurity:   . Worried About Charity fundraiser in the Last Year:   . Arboriculturist in the Last Year:   Transportation Needs:   . Film/video editor (Medical):   Marland Kitchen Lack of Transportation (Non-Medical):   Physical Activity:   . Days of Exercise per Week:   . Minutes of Exercise per Session:   Stress:   . Feeling of Stress :   Social Connections:   . Frequency of Communication with Friends and Family:   . Frequency of  Social Gatherings with Friends and Family:   . Attends Religious Services:   . Active Member of Clubs or Organizations:   . Attends Archivist Meetings:   Marland Kitchen Marital Status:      Family History: The patient's mother had diabetes.   1 of her brothers had diabetes and drug issues and has passed away.  Another brother is alive and well at age 21.  She has a healthy sister.  2 of her daughters have hypertension there does not appear to be a prominent family history of coronary disease. ROS:   Please see the history of present illness.    All other systems are reviewed and are negative.   EKGs/Labs/Other Studies Reviewed:    EKG:  EKG is ordered today.  It is a normal tracing, normal sinus rhythm with a couple of PACs.  No ischemic changes, QTC 458 ms  Recent Labs: No results found for requested labs within last 8760 hours.  Recent Lipid Panel September 07, 2018 hemoglobin 10.8, creatinine 1.21, potassium 5.3,  TSH 0.335 October 2020 TSH 0.19 October 29, 2019 hemoglobin 10.8, creatinine 1.66, potassium 5.0, normal liver function tests  Lipid Panel  No results found for: CHOL, TRIG, HDL, CHOLHDL, VLDL, LDLCALC, LDLDIRECT February 2019 cholesterol 160, HDL 31, LDL 115, triglycerides 72 October 2020 total cholesterol 164, HDL 40, LDL 111, triglycerides 66  Physical Exam:    VS:  BP 125/61   Pulse 78   Ht 5\' 5"  (1.651 m)   Wt 132 lb 8 oz (60.1 kg)   BMI 22.05 kg/m    Recheck BP 105/72 mmHg  Wt Readings from Last 3 Encounters:  05/26/20 132 lb 8 oz (60.1 kg)  05/03/19 147 lb (66.7 kg)  06/01/18 134 lb 3.2 oz (60.9 kg)     General: Alert, oriented x3, no distress, lean, appears younger than stated age. Head: no evidence of trauma, PERRL, EOMI, no exophtalmos or lid lag, no myxedema, no xanthelasma; normal ears, nose and oropharynx Neck: normal jugular venous pulsations and no hepatojugular reflux; brisk carotid pulses without delay and no carotid bruits Chest: clear to  auscultation, no signs of consolidation by percussion or palpation, normal fremitus, symmetrical and full respiratory excursions Cardiovascular: normal position and quality of the apical impulse, regular rhythm, normal first and second heart sounds, no murmurs, rubs or gallops Abdomen: no tenderness or distention, no masses by palpation, no abnormal pulsatility or arterial bruits, normal bowel sounds, no hepatosplenomegaly Extremities: no clubbing, cyanosis or edema; 2+ radial, ulnar and brachial pulses bilaterally; 2+ right femoral, posterior tibial and dorsalis pedis pulses; 2+ left femoral, posterior tibial and dorsalis pedis pulses; no subclavian or femoral bruits Neurological: grossly nonfocal Psych: Normal mood and affect  ASSESSMENT:    1. Essential hypertension    PLAN:  In order of problems listed above:  1. CHF: Mildly depressed left ventricular function.  Appears clinically euvolemic, NYHA functional class I-2, using only a very low-dose of diuretics.  On carvedilol and losartan. 2. HTN: Well-controlled. 3. Hyperthyroidism/thyroid nodule: Most recent TSH is from October and remains borderline suppressed. 4. Anemia/history of bleeding duodenal ulcer: Stable mild anemia.   Medication Adjustments/Labs and Tests Ordered: Current medicines are reviewed at length with the patient today.  Concerns regarding medicines are outlined above.  Orders Placed This Encounter  Procedures  . EKG 12-Lead   No orders of the defined types were placed in this encounter.   Signed, Sanda Klein, MD  05/26/2020 9:31 AM    [place]

## 2020-05-26 NOTE — Patient Instructions (Signed)

## 2020-06-26 DIAGNOSIS — I10 Essential (primary) hypertension: Secondary | ICD-10-CM | POA: Diagnosis not present

## 2020-06-26 DIAGNOSIS — E042 Nontoxic multinodular goiter: Secondary | ICD-10-CM | POA: Diagnosis not present

## 2020-06-26 DIAGNOSIS — E7800 Pure hypercholesterolemia, unspecified: Secondary | ICD-10-CM | POA: Diagnosis not present

## 2020-06-26 DIAGNOSIS — Z23 Encounter for immunization: Secondary | ICD-10-CM | POA: Diagnosis not present

## 2020-06-26 DIAGNOSIS — Z1389 Encounter for screening for other disorder: Secondary | ICD-10-CM | POA: Diagnosis not present

## 2020-06-26 DIAGNOSIS — I5022 Chronic systolic (congestive) heart failure: Secondary | ICD-10-CM | POA: Diagnosis not present

## 2020-06-26 DIAGNOSIS — R630 Anorexia: Secondary | ICD-10-CM | POA: Diagnosis not present

## 2020-06-26 DIAGNOSIS — E0590 Thyrotoxicosis, unspecified without thyrotoxic crisis or storm: Secondary | ICD-10-CM | POA: Diagnosis not present

## 2020-06-26 DIAGNOSIS — Z0000 Encounter for general adult medical examination without abnormal findings: Secondary | ICD-10-CM | POA: Diagnosis not present

## 2020-06-26 DIAGNOSIS — I480 Paroxysmal atrial fibrillation: Secondary | ICD-10-CM | POA: Diagnosis not present

## 2020-06-26 DIAGNOSIS — E039 Hypothyroidism, unspecified: Secondary | ICD-10-CM | POA: Diagnosis not present

## 2020-07-07 DIAGNOSIS — Z1231 Encounter for screening mammogram for malignant neoplasm of breast: Secondary | ICD-10-CM | POA: Diagnosis not present

## 2020-08-21 DIAGNOSIS — E042 Nontoxic multinodular goiter: Secondary | ICD-10-CM | POA: Diagnosis not present

## 2020-08-21 DIAGNOSIS — E0590 Thyrotoxicosis, unspecified without thyrotoxic crisis or storm: Secondary | ICD-10-CM | POA: Diagnosis not present

## 2020-09-01 DIAGNOSIS — F320 Major depressive disorder, single episode, mild: Secondary | ICD-10-CM | POA: Diagnosis not present

## 2020-09-01 DIAGNOSIS — R630 Anorexia: Secondary | ICD-10-CM | POA: Diagnosis not present

## 2020-09-01 DIAGNOSIS — E0590 Thyrotoxicosis, unspecified without thyrotoxic crisis or storm: Secondary | ICD-10-CM | POA: Diagnosis not present

## 2020-09-01 DIAGNOSIS — I10 Essential (primary) hypertension: Secondary | ICD-10-CM | POA: Diagnosis not present

## 2020-10-04 DIAGNOSIS — Z23 Encounter for immunization: Secondary | ICD-10-CM | POA: Diagnosis not present

## 2020-12-13 ENCOUNTER — Telehealth: Payer: Self-pay | Admitting: Cardiovascular Disease

## 2020-12-13 NOTE — Telephone Encounter (Signed)
Call to patient's daughter Janee Morn (DPR on file).  Monday evening 2.28.2022 Shirlean Mylar arrived home to find patient slumped over, diaphoretic with Bp left arm 88/48 (history of Breast CA and lymph node removal in left arm) Right arm Bp was 102/70.  Pt was slow to respond but with encouraged po intake and stimulation pt returned to her "normal" self.    Patient's daughter and caretaker continued to check blood pressures since Monday in left and right arm.  Discussed with patient's daughter regarding BP in left arm which has consistently been low and right arm BP within normal limits.    We discussed whether Bp is supposed to be checked in patient's left arm as there is history of Breast CA and lymph node surgery.  Shirlean Mylar stated "no is shouldn't be checked in left arm."  Shirlean Mylar confirmed patient's intake is probably not where it should be, and she needs a lot of encouragement to eat and drink fluids.  Losartan held last night and this morning. 3.1.2022 BP L-95/70, R-155/70 BP L-89/77, R-125/59  3.2.2022 BP L 96/65, R-140/70.  Message to Dr [name] and his nurse.

## 2020-12-13 NOTE — Telephone Encounter (Signed)
Pt c/o BP issue: STAT if pt c/o blurred vision, one-sided weakness or slurred speech  1. What are your last 5 BP readings? Right arm last night was 140/70   Left arm last night 96/65       89/77 left arn right arm was 125/60m at     2.Are you having any other symptoms (ex. Dizziness, headache, blurred vision, passed out)?  Monday night it was 88/48 pt went limp    What is your BP issue? blood pressure is running low- pt need to be seen- first available  Is 12-29-20 with Diane Ross

## 2020-12-15 NOTE — Telephone Encounter (Signed)
The "real" BP is the higher one, in the right arm. That was never really low, so I am not sure we should blame her BP for these events. Need to make sure it's not a rhythm problem. Can we please order a 14 day monitor (live download preferable). Thanks.

## 2020-12-15 NOTE — Telephone Encounter (Signed)
Attempted to call patient's daughter back. Was unable to leave a message

## 2020-12-18 NOTE — Telephone Encounter (Signed)
Left a message for the patient and daughter to call back.

## 2020-12-20 NOTE — Telephone Encounter (Signed)
Attempted to reach the patient and the daughter. Was unable to leave a message.

## 2020-12-25 NOTE — Telephone Encounter (Signed)
Attempted to reach the patient and the daughter. Was unable to leave a message.

## 2020-12-29 ENCOUNTER — Other Ambulatory Visit: Payer: Self-pay

## 2020-12-29 ENCOUNTER — Encounter: Payer: Self-pay | Admitting: Radiology

## 2020-12-29 ENCOUNTER — Ambulatory Visit (INDEPENDENT_AMBULATORY_CARE_PROVIDER_SITE_OTHER): Payer: Medicare Other

## 2020-12-29 ENCOUNTER — Ambulatory Visit (INDEPENDENT_AMBULATORY_CARE_PROVIDER_SITE_OTHER): Payer: Medicare Other | Admitting: Physician Assistant

## 2020-12-29 ENCOUNTER — Encounter: Payer: Self-pay | Admitting: Physician Assistant

## 2020-12-29 VITALS — BP 150/76 | HR 86 | Ht 65.0 in | Wt 138.4 lb

## 2020-12-29 DIAGNOSIS — R5383 Other fatigue: Secondary | ICD-10-CM

## 2020-12-29 DIAGNOSIS — I10 Essential (primary) hypertension: Secondary | ICD-10-CM | POA: Diagnosis not present

## 2020-12-29 DIAGNOSIS — R55 Syncope and collapse: Secondary | ICD-10-CM

## 2020-12-29 LAB — COMPREHENSIVE METABOLIC PANEL
ALT: 11 IU/L (ref 0–32)
AST: 20 IU/L (ref 0–40)
Albumin/Globulin Ratio: 1.5 (ref 1.2–2.2)
Albumin: 4.1 g/dL (ref 3.6–4.6)
Alkaline Phosphatase: 82 IU/L (ref 44–121)
BUN/Creatinine Ratio: 19 (ref 12–28)
BUN: 23 mg/dL (ref 8–27)
Bilirubin Total: 0.3 mg/dL (ref 0.0–1.2)
CO2: 27 mmol/L (ref 20–29)
Calcium: 9.3 mg/dL (ref 8.7–10.3)
Chloride: 100 mmol/L (ref 96–106)
Creatinine, Ser: 1.19 mg/dL — ABNORMAL HIGH (ref 0.57–1.00)
Globulin, Total: 2.8 g/dL (ref 1.5–4.5)
Glucose: 78 mg/dL (ref 65–99)
Potassium: 4.8 mmol/L (ref 3.5–5.2)
Sodium: 139 mmol/L (ref 134–144)
Total Protein: 6.9 g/dL (ref 6.0–8.5)
eGFR: 44 mL/min/{1.73_m2} — ABNORMAL LOW (ref >59)

## 2020-12-29 LAB — CBC
Hematocrit: 34.1 % (ref 34.0–46.6)
Hemoglobin: 10.8 g/dL — ABNORMAL LOW (ref 11.1–15.9)
MCH: 28.4 pg (ref 26.6–33.0)
MCHC: 31.7 g/dL (ref 31.5–35.7)
MCV: 90 fL (ref 79–97)
Platelets: 215 10*3/uL (ref 150–450)
RBC: 3.8 x10E6/uL (ref 3.77–5.28)
RDW: 13.3 % (ref 11.7–15.4)
WBC: 5.7 10*3/uL (ref 3.4–10.8)

## 2020-12-29 LAB — TSH: TSH: 0.701 u[IU]/mL (ref 0.450–4.500)

## 2020-12-29 MED ORDER — LOSARTAN POTASSIUM 25 MG PO TABS: 25.0000 mg | ORAL_TABLET | Freq: Two times a day (BID) | ORAL | 3 refills | Status: DC

## 2020-12-29 NOTE — Patient Instructions (Addendum)
Medication Instructions:   DECREASE Losartan to 25 mg by mouth 2 times a day   *If you need a refill on your cardiac medications before your next appointment, please call your pharmacy*   Lab Work: Your physician recommends that you return for lab work TODAY:   CBC  CMET  TSH  If you have labs (blood work) drawn today and your tests are completely normal, you will receive your results only by: Marland Kitchen MyChart Message (if you have MyChart) OR . A paper copy in the mail If you have any lab test that is abnormal or we need to change your treatment, we will call you to review the results.   Testing/Procedures: Your physician has requested that you have an echocardiogram. Echocardiography is a painless test that uses sound waves to create images of your heart. It provides your doctor with information about the size and shape of your heart and how well your heart's chambers and valves are working. This procedure takes approximately one hour. There are no restrictions for this procedure. This test is performed at [address], [place], [place] [address]   Please schedule for 4 weeks   ZIO AT Long term monitor-Live Telemetry  Your physician has requested you wear a ZIO patch monitor for  14 days.  This is a single patch monitor. Ir[employer] supplies one patch monitor per enrollment. Additional stickers are not available.  Please do not apply patch if you will be having a Nuclear Stress Test, Echocardiogram, Cardiac CT, MRI, or Chest Xray during the time frame you would be wearing the monitor. The patch cannot be worn during these tests. You cannot remove and re-apply the ZIO AT patch monitor.   Your ZIO patch monitor will be sent Fed Ex from [employer] directly to your home address. The monitor may also be mailed to a PO BOX if home delivery is not available. It may take 3-5 days to receive your monitor after you have been enrolled.  Once you have received you monitor, please  review enclosed instructions. Your monitor has already been registered assigning a specific monitor serial # to you.   Applying the monitor  Shave hair from upper left chest.  Hold abrader disc by orange tab. Rub abrader in 40 strokes over left upper chest as indicated in your monitor instructions.  Clean area with 4 enclosed alcohol pads. Use all pads to ensure the area is cleaned thoroughly. Let dry.  Apply patch as indicated in monitor instructions. Patch will be placed under collarbone on left side of chest with arrow pointing upward.  Rub patch adhesive wings for 2 minutes. Remove the white label marked "1". Remove the white label marked "2". Rub patch adhesive wings for 2 additional minutes.  While looking in a mirror, press and release button in center of patch. A small green light will flash 3-4 times. This will be your only indicator the monitor has been turned on.  Do not shower for the first 24 hours. You may shower after the first 24 hours.  Press the button if you feel a symptom. You will hear a small click. Record Date, Time and Symptom in the Patient Log.   Starting the Gateway  In your kit there is a Hydrographic surveyor box the size of a cellphone. This is Airline pilot. It transmits all your recorded data to [facility]. This box must stay within 10 feet of you at all times. Open the box and push the * button.  There will be a light that blinks orange and then green a few times. When the light stops blinking, the Gateway is connected to the ZIO patch.  Call [employer] at [phone] to confirm your monitor is transmitting.   Returning your monitor  Remove your patch and place it inside the [place]. In the lower half of the Gateway there is a white bag with prepaid postage on it. Place Gateway in bag and seal. Mail package back to [place] as soon as possible. Your physician should have your final report approximately 7 days after you have mailed back your monitor.   Call [place] at [phone] if you have questions regarding your ZIO AT patch monitor. Call them immediately if you see an orange light blinking on your monitor.  If your monitor falls off in less than 4 days contact our Monitor department at [phone]. If your monitor becomes loose or falls off after 4 days call [employer] at [phone] for suggestions on securing your monitor.     Follow-Up: At [facility], you and your health needs are our priority.  As part of our continuing mission to provide you with exceptional heart care, we have created designated Provider Care Teams.  These Care Teams include your primary Cardiologist (physician) and Advanced Practice Providers (APPs -  Physician Assistants and Nurse Practitioners) who all work together to provide you with the care you need, when you need it.  We recommend signing up for the patient portal called "MyChart".  Sign up information is provided on this After Visit Summary.  MyChart is used to connect with patients for Virtual Visits (Telemedicine).  Patients are able to view lab/test results, encounter notes, upcoming appointments, etc.  Non-urgent messages can be sent to your provider as well.   To learn more about what you can do with MyChart, go to [email or web address].    Your next appointment:   5-6 week(s)  The format for your next appointment:   In Person  Provider:   Sanda Klein, MD or APP  Other Instructions

## 2020-12-29 NOTE — Progress Notes (Signed)
Cardiology Office Note:    Date:  12/31/2020   ID:  Diane Ross, DOB 1931-07-22, MRN [id number]  PCP:  [name], NP (Inactive)   [place]  Cardiologist:  Sanda Klein, MD  Advanced Practice Provider:  No care team member to display Electrophysiologist:  None   Referring MD: No ref. provider found   Chief Complaint  Patient presents with  . Follow-up    Seen for near-syncope    History of Present Illness:    Diane Ross is a 87 y.o. female with a hx of hypertension, heart failure with mildly decreased systolic function and dementia.  She has a history of left breast cancer treated with lumpectomy.  Thyroid nodule fine-needle aspiration obtained in August 2019 showed atypical findings of uncertain significance.  She was hospitalized with bleeding duodenal ulcer in June 2019 and was treated for H. pylori infection.  She has not had any major bleeding since.  Myoview obtained on 11/24/2017 showed EF 45%, no perfusion defect identified, intermediate risk study due to reduced ejection fraction.  Echocardiogram obtained on 01/12/2018 showed EF 50 to 55%, grade 1 DD, moderate to severe calcified mitral valve with mild MR, moderate LAE.  Patient was last seen by Dr. [name] in August 2021 at which time she was doing well.  Blood pressure at the time was 125/61.  Based on phone conversation, it appears the patient's daughter arrived home on 12/11/2020 finding the patient slumped over, diaphoretic with blood pressure in the left arm 88/48.  Right arm blood pressure was 102/70.  Patient was slow to respond however was encouraged p.o. intake and stimulation, she returned to her normal self.  Repeat blood pressure in the next day continue to show low blood pressure in the left arm and abnormal blood pressure in the right arm.  Patient presents today for follow-up accompanied by her daughter.  Since the night when she had a near syncope, she has completely recovered back  to her normal self.  Daughter initially held losartan, however in the past few days has reintroduced losartan at 25 mg daily instead of the previous 50 mg twice daily dosing.  Blood pressure based on the right arm is elevated in the 150s, I will increase the losartan to 25 mg twice daily dosing.  I did a full neuro exam on her, at this point, she does not have any obvious neuro deficit.  She has not had any recurrence of symptoms since.  I recommended initial screening using basic lab work such as comprehensive metabolic panel, TSH and CBC to rule out secondary causes for presyncope.  I also recommended 2 weeks Holter monitor and echocardiogram.  Past Medical History:  Diagnosis Date  . Breast cancer ([place])    left side  . Hypertension   . Thyroid disorder     Past Surgical History:  Procedure Laterality Date  . ABDOMINAL HYSTERECTOMY    . BREAST LUMPECTOMY Left   . ESOPHAGOGASTRODUODENOSCOPY N/A 03/29/2018   Procedure: ESOPHAGOGASTRODUODENOSCOPY (EGD);  Surgeon: Carol Ada, MD;  Location: [facility];  Service: Endoscopy;  Laterality: N/A;    Current Medications: Current Meds  Medication Sig  . carboxymethylcellulose (REFRESH PLUS) 0.5 % SOLN Place 2 drops into both eyes 3 (three) times daily as needed (for dry eyes).  . carvedilol (COREG) 6.25 MG tablet Take 1 tablet (6.25 mg total) by mouth 2 (two) times daily with a meal.  . furosemide (LASIX) 40 MG tablet Take one tablet, 40 mg, three days  a week: Monday, Wednesday and Friday  . Multiple Vitamin (MULTIVITAMIN) tablet Take 1 tablet by mouth daily.  . pantoprazole (PROTONIX) 40 MG tablet Take 1 tablet by mouth once daily  . potassium chloride SA (K-DUR) 20 MEQ tablet Take one tablet on Monday, Wednesday and Friday  . vitamin B-12 (CYANOCOBALAMIN) 100 MCG tablet Take 100 mcg by mouth daily.     Allergies:   Patient has no known allergies.   Social History   Socioeconomic History  . Marital status: Widowed    Spouse name: Not  on file  . Number of children: Not on file  . Years of education: Not on file  . Highest education level: Not on file  Occupational History  . Occupation: retired  Tobacco Use  . Smoking status: Former Smoker    Years: 55.00  . Smokeless tobacco: Never Used  Vaping Use  . Vaping Use: Never used  Substance and Sexual Activity  . Alcohol use: No  . Drug use: No  . Sexual activity: Never  Other Topics Concern  . Not on file  Social History Narrative  . Not on file   Social Determinants of Health   Financial Resource Strain: Not on file  Food Insecurity: Not on file  Transportation Needs: Not on file  Physical Activity: Not on file  Stress: Not on file  Social Connections: Not on file     Family History: The patient's family history is not on file.  ROS:   Please see the history of present illness.     All other systems reviewed and are negative.  EKGs/Labs/Other Studies Reviewed:    The following studies were reviewed today:  Echo 01/12/2018 LV EF: 50% -  55%   Study Conclusions   - Left ventricle: Global longitudinal strain is -16.8% The cavity  size was normal. Wall thickness was increased in a pattern of  mild LVH. Systolic function was normal. The estimated ejection  fraction was in the range of 50% to 55%. Doppler parameters are  consistent with abnormal left ventricular relaxation (grade 1  diastolic dysfunction).  - Mitral valve: Moderately to severely calcified annulus. Mildly  thickened leaflets . There was mild regurgitation.  - Left atrium: The atrium was moderately dilated.   EKG:  EKG is ordered today.  The ekg ordered today demonstrates normal sinus rhythm without significant ST-T wave changes.  Recent Labs: 12/29/2020: ALT 11; BUN 23; Creatinine, Ser 1.19; Hemoglobin 10.8; Platelets 215; Potassium 4.8; Sodium 139; TSH 0.701  Recent Lipid Panel No results found for: CHOL, TRIG, HDL, CHOLHDL, VLDL, LDLCALC, LDLDIRECT   Risk  Assessment/Calculations:       Physical Exam:    VS:  BP (!) 150/76   Pulse 86   Ht 5' 5"  (1.651 m)   Wt 138 lb 6.4 oz (62.8 kg)   SpO2 94%   BMI 23.03 kg/m     Wt Readings from Last 3 Encounters:  12/29/20 138 lb 6.4 oz (62.8 kg)  05/26/20 132 lb 8 oz (60.1 kg)  05/03/19 147 lb (66.7 kg)     GEN:  Well nourished, well developed in no acute distress HEENT: Normal NECK: No JVD; No carotid bruits LYMPHATICS: No lymphadenopathy CARDIAC: RRR, no murmurs, rubs, gallops RESPIRATORY:  Clear to auscultation without rales, wheezing or rhonchi  ABDOMEN: Soft, non-tender, non-distended MUSCULOSKELETAL:  No edema; No deformity  SKIN: Warm and dry NEUROLOGIC:  Alert and oriented x 3 PSYCHIATRIC:  Normal affect   ASSESSMENT:  1. Pre-syncope   2. Fatigue, unspecified type   3. Essential hypertension    PLAN:    In order of problems listed above:  1. Presyncope: Symptom has resolved and has not recurred since.  Solitary episode.  Blood pressure did drop during the episode.  I recommended screening lab work including CBC, comprehensive metabolic panel and a TSH.  If above lab works are normal, she should proceed with echocardiogram and a 2 weeks heart monitor.  She has a history were ejection fraction was borderline low on Myoview, however subsequent echocardiogram showed normal ejection fraction.  2. Fatigue: See above, screening lab work including CBC, comprehensive metabolic panel and a TSH  3. Hypertension: Blood pressure borderline high today after she reduced the losartan to 25 mg daily instead of 50 mg twice daily dosing. I increased the losartan to 25 mg twice daily.   Medication Adjustments/Labs and Tests Ordered: Current medicines are reviewed at length with the patient today.  Concerns regarding medicines are outlined above.  Orders Placed This Encounter  Procedures  . CBC  . Comprehensive metabolic panel  . TSH  . LONG TERM MONITOR-LIVE TELEMETRY (3-14 DAYS)  .  EKG 12-Lead  . ECHOCARDIOGRAM COMPLETE   Meds ordered this encounter  Medications  . losartan (COZAAR) 25 MG tablet    Sig: Take 1 tablet (25 mg total) by mouth 2 (two) times daily.    Dispense:  180 tablet    Refill:  3    Dose Change New Rx.    Patient Instructions  Medication Instructions:   DECREASE Losartan to 25 mg by mouth 2 times a day   *If you need a refill on your cardiac medications before your next appointment, please call your pharmacy*   Lab Work: Your physician recommends that you return for lab work TODAY:   CBC  CMET  TSH  If you have labs (blood work) drawn today and your tests are completely normal, you will receive your results only by: Marland Kitchen MyChart Message (if you have MyChart) OR . A paper copy in the mail If you have any lab test that is abnormal or we need to change your treatment, we will call you to review the results.   Testing/Procedures: Your physician has requested that you have an echocardiogram. Echocardiography is a painless test that uses sound waves to create images of your heart. It provides your doctor with information about the size and shape of your heart and how well your heart's chambers and valves are working. This procedure takes approximately one hour. There are no restrictions for this procedure. This test is performed at [address], [place],  [address]   Please schedule for 4 weeks   ZIO AT Long term monitor-Live Telemetry  Your physician has requested you wear a ZIO patch monitor for  14 days.  This is a single patch monitor. [employer] supplies one patch monitor per enrollment. Additional stickers are not available.  Please do not apply patch if you will be having a Nuclear Stress Test, Echocardiogram, Cardiac CT, MRI, or Chest Xray during the time frame you would be wearing the monitor. The patch cannot be worn during these tests. You cannot remove and re-apply the ZIO AT patch monitor.   Your ZIO patch  monitor will be sent Fed Ex from [employer] directly to your home address. The monitor may also be mailed to a PO BOX if home delivery is not available. It may take 3-5 days to  receive your monitor after you have been enrolled.  Once you have received you monitor, please review enclosed instructions. Your monitor has already been registered assigning a specific monitor serial # to you.   Applying the monitor  Shave hair from upper left chest.  Hold abrader disc by orange tab. Rub abrader in 40 strokes over left upper chest as indicated in your monitor instructions.  Clean area with 4 enclosed alcohol pads. Use all pads to ensure the area is cleaned thoroughly. Let dry.  Apply patch as indicated in monitor instructions. Patch will be placed under collarbone on left side of chest with arrow pointing upward.  Rub patch adhesive wings for 2 minutes. Remove the white label marked "1". Remove the white label marked "2". Rub patch adhesive wings for 2 additional minutes.  While looking in a mirror, press and release button in center of patch. A small green light will flash 3-4 times. This will be your only indicator the monitor has been turned on.  Do not shower for the first 24 hours. You may shower after the first 24 hours.  Press the button if you feel a symptom. You will hear a small click. Record Date, Time and Symptom in the Patient Log.   Starting the Gateway  In your kit there is a Hydrographic surveyor box the size of a cellphone. This is Airline pilot. It transmits all your recorded data to [facility]. This box must stay within 10 feet of you at all times. Open the box and push the * button. There will be a light that blinks orange and then green a few times. When the light stops blinking, the Gateway is connected to the ZIO patch.  Call [employer] at [phone] to confirm your monitor is transmitting.   Returning your monitor  Remove your patch and place it inside the [place]. In the lower half of  the Gateway there is a white bag with prepaid postage on it. Place Gateway in bag and seal. Mail package back to [place] as soon as possible. Your physician should have your final report approximately 7 days after you have mailed back your monitor.   Call [place] at [phone] if you have questions regarding your ZIO AT patch monitor. Call them immediately if you see an orange light blinking on your monitor.  If your monitor falls off in less than 4 days contact our Monitor department at [phone]. If your monitor becomes loose or falls off after 4 days call [employer] at [phone] for suggestions on securing your monitor.     Follow-Up: At [facility], you and your health needs are our priority.  As part of our continuing mission to provide you with exceptional heart care, we have created designated Provider Care Teams.  These Care Teams include your primary Cardiologist (physician) and Advanced Practice Providers (APPs -  Physician Assistants and Nurse Practitioners) who all work together to provide you with the care you need, when you need it.  We recommend signing up for the patient portal called "MyChart".  Sign up information is provided on this After Visit Summary.  MyChart is used to connect with patients for Virtual Visits (Telemedicine).  Patients are able to view lab/test results, encounter notes, upcoming appointments, etc.  Non-urgent messages can be sent to your provider as well.   To learn more about what you can do with MyChart, go to [email or web address].    Your next appointment:   5-6 week(s)  The format for your  next appointment:   In Person  Provider:   Sanda Klein, MD or APP  Other Instructions      Signed, Almyra Deforest, [place]  12/31/2020 11:16 PM    [place]

## 2020-12-29 NOTE — Progress Notes (Signed)
Enrolled patient for a 14 day Zio AT monitor to be mailed to patients home.  

## 2020-12-29 NOTE — Telephone Encounter (Signed)
Patient had an appointment 3/18. Monitor ordered.

## 2020-12-31 ENCOUNTER — Encounter: Payer: Self-pay | Admitting: Physician Assistant

## 2021-01-01 NOTE — Progress Notes (Signed)
Thyroid function ok, red blood cell count stable. Renal function, electrolyte and liver enzyme also stable.

## 2021-01-15 DIAGNOSIS — R55 Syncope and collapse: Secondary | ICD-10-CM | POA: Diagnosis not present

## 2021-01-16 DIAGNOSIS — R55 Syncope and collapse: Secondary | ICD-10-CM | POA: Diagnosis not present

## 2021-01-29 ENCOUNTER — Encounter (INDEPENDENT_AMBULATORY_CARE_PROVIDER_SITE_OTHER): Payer: Self-pay

## 2021-01-29 ENCOUNTER — Ambulatory Visit (HOSPITAL_COMMUNITY): Payer: Medicare Other | Attending: Cardiology

## 2021-01-29 ENCOUNTER — Other Ambulatory Visit: Payer: Self-pay

## 2021-01-29 DIAGNOSIS — R55 Syncope and collapse: Secondary | ICD-10-CM | POA: Diagnosis not present

## 2021-01-29 LAB — ECHOCARDIOGRAM COMPLETE
Area-P 1/2: 2.87 cm2
S' Lateral: 2.3 cm

## 2021-02-01 ENCOUNTER — Encounter: Payer: Self-pay | Admitting: *Deleted

## 2021-02-16 ENCOUNTER — Ambulatory Visit: Payer: Medicare Other | Admitting: Cardiovascular Disease

## 2021-03-05 DIAGNOSIS — E042 Nontoxic multinodular goiter: Secondary | ICD-10-CM | POA: Diagnosis not present

## 2021-03-05 DIAGNOSIS — H9193 Unspecified hearing loss, bilateral: Secondary | ICD-10-CM | POA: Diagnosis not present

## 2021-03-05 DIAGNOSIS — E0590 Thyrotoxicosis, unspecified without thyrotoxic crisis or storm: Secondary | ICD-10-CM | POA: Diagnosis not present

## 2021-03-05 DIAGNOSIS — I10 Essential (primary) hypertension: Secondary | ICD-10-CM | POA: Diagnosis not present

## 2021-03-05 DIAGNOSIS — I480 Paroxysmal atrial fibrillation: Secondary | ICD-10-CM | POA: Diagnosis not present

## 2021-03-05 DIAGNOSIS — Z0000 Encounter for general adult medical examination without abnormal findings: Secondary | ICD-10-CM | POA: Diagnosis not present

## 2021-03-05 DIAGNOSIS — I5022 Chronic systolic (congestive) heart failure: Secondary | ICD-10-CM | POA: Diagnosis not present

## 2021-03-14 ENCOUNTER — Emergency Department (HOSPITAL_COMMUNITY)
Admission: EM | Admit: 2021-03-14 | Discharge: 2021-03-15 | Disposition: A | Discharge status: Home / Self Care | Payer: Medicare Other | Attending: Emergency Medicine | Admitting: Emergency Medicine

## 2021-03-14 ENCOUNTER — Other Ambulatory Visit: Payer: Self-pay

## 2021-03-14 DIAGNOSIS — R55 Syncope and collapse: Secondary | ICD-10-CM | POA: Diagnosis not present

## 2021-03-14 DIAGNOSIS — R404 Transient alteration of awareness: Secondary | ICD-10-CM | POA: Diagnosis not present

## 2021-03-14 DIAGNOSIS — I110 Hypertensive heart disease with heart failure: Secondary | ICD-10-CM | POA: Diagnosis not present

## 2021-03-14 DIAGNOSIS — I5042 Chronic combined systolic (congestive) and diastolic (congestive) heart failure: Secondary | ICD-10-CM | POA: Insufficient documentation

## 2021-03-14 DIAGNOSIS — Z79899 Other long term (current) drug therapy: Secondary | ICD-10-CM | POA: Insufficient documentation

## 2021-03-14 DIAGNOSIS — R109 Unspecified abdominal pain: Secondary | ICD-10-CM | POA: Diagnosis not present

## 2021-03-14 DIAGNOSIS — Z87891 Personal history of nicotine dependence: Secondary | ICD-10-CM | POA: Insufficient documentation

## 2021-03-14 DIAGNOSIS — Z743 Need for continuous supervision: Secondary | ICD-10-CM | POA: Diagnosis not present

## 2021-03-14 DIAGNOSIS — R0789 Other chest pain: Secondary | ICD-10-CM | POA: Diagnosis not present

## 2021-03-14 DIAGNOSIS — Z853 Personal history of malignant neoplasm of breast: Secondary | ICD-10-CM | POA: Diagnosis not present

## 2021-03-14 DIAGNOSIS — U071 COVID-19: Secondary | ICD-10-CM | POA: Diagnosis not present

## 2021-03-14 DIAGNOSIS — R079 Chest pain, unspecified: Secondary | ICD-10-CM | POA: Diagnosis not present

## 2021-03-14 LAB — CBC
HCT: 35.2 % — ABNORMAL LOW (ref 36.0–46.0)
Hemoglobin: 10.4 g/dL — ABNORMAL LOW (ref 12.0–15.0)
MCH: 28.6 pg (ref 26.0–34.0)
MCHC: 29.5 g/dL — ABNORMAL LOW (ref 30.0–36.0)
MCV: 96.7 fL (ref 80.0–100.0)
Platelets: 180 10*3/uL (ref 150–400)
RBC: 3.64 MIL/uL — ABNORMAL LOW (ref 3.87–5.11)
RDW: 13.2 % (ref 11.5–15.5)
WBC: 7.3 10*3/uL (ref 4.0–10.5)
nRBC: 0 % (ref 0.0–0.2)

## 2021-03-14 LAB — BASIC METABOLIC PANEL
Anion gap: 12 (ref 5–15)
BUN: 28 mg/dL — ABNORMAL HIGH (ref 8–23)
CO2: 26 mmol/L (ref 22–32)
Calcium: 8.3 mg/dL — ABNORMAL LOW (ref 8.9–10.3)
Chloride: 98 mmol/L (ref 98–111)
Creatinine, Ser: 1.49 mg/dL — ABNORMAL HIGH (ref 0.44–1.00)
GFR, Estimated: 34 mL/min — ABNORMAL LOW (ref >60)
Glucose, Bld: 122 mg/dL — ABNORMAL HIGH (ref 70–99)
Potassium: 4.2 mmol/L (ref 3.5–5.1)
Sodium: 136 mmol/L (ref 135–145)

## 2021-03-14 NOTE — ED Triage Notes (Signed)
Brought in by [facility] from home  - syncope episode - pt was having dinner and daughter found her slumped over a table, only responsive to painful stimuli and pt came around.   Dx yesterday with covid. 12lead unremarkable. 324mg  aspirin. Stroke screen negative. Having generalized weakness. Denies any fall, hitting head and blood thinners.   GCS14 baseline, dementia.

## 2021-03-14 NOTE — ED Provider Notes (Signed)
Emergency Medicine Provider Triage Evaluation Note  Diane Ross , a 87 y.o. female  was evaluated in triage.  Pt complains of syncope.  Review of Systems  Positive: syncope Negative: Headache, cough  Physical Exam  BP 123/60 (BP Location: Left Arm)   Pulse 70   Temp 98.5 F (36.9 C) (Oral)   Resp 14   Ht 5\' 5"  (1.651 m)   Wt 62.8 kg   SpO2 100%   BMI 23.04 kg/m  Gen:   Awake, no distress   Resp:  Normal effort  MSK:   Moves extremities without difficulty  Other:    Medical Decision Making  Medically screening exam initiated at 8:26 PM.  Appropriate orders placed.  Diane Ross was informed that the remainder of the evaluation will be completed by another provider, this initial triage assessment does not replace that evaluation, and the importance of remaining in the ED until their evaluation is complete.  Pt recently diagnosed with covid here with a syncopal episode today while eating dinner with daughter.  Denies any injury.  Denies decreased in appetite. No SOB.  Has dementia   Doy Hutching 03/14/21 2027    Blanchie Dessert, MD 03/14/21 [phone]

## 2021-03-15 ENCOUNTER — Ambulatory Visit: Payer: Medicare Other | Admitting: Physician Assistant

## 2021-03-15 LAB — URINALYSIS, ROUTINE W REFLEX MICROSCOPIC
Bilirubin Urine: NEGATIVE
Glucose, UA: NEGATIVE mg/dL
Ketones, ur: NEGATIVE mg/dL
Nitrite: NEGATIVE
Protein, ur: NEGATIVE mg/dL
Specific Gravity, Urine: 1.003 — ABNORMAL LOW (ref 1.005–1.030)
pH: 6 (ref 5.0–8.0)

## 2021-03-15 MED ORDER — LACTATED RINGERS IV BOLUS
1000.0000 mL | Freq: Once | INTRAVENOUS | Status: AC
  Administered 2021-03-15: 1000 mL via INTRAVENOUS

## 2021-03-15 NOTE — ED Provider Notes (Signed)
Diane Ross   CSN: [id number] Arrival date & time: 03/14/21  2005     History Chief Complaint  Patient presents with  . Near Syncope    Diane Ross is a 87 y.o. female.  Recently diagnosed with covid. Eating/drinking normally. Had an episode at the table where she nearly syncopized but was apparently still responsive to pain. No surrounding s ymptoms. Has been fine since then. Has had similar episodes in past. Wasn't complaining off severe chest pain, back pain, headache, vision changes, abdominal pain or other associated symptoms with the event. Happened approximately 45 minutes after taking her medicine which her daughter wonders if itis related.   The history is provided by the patient and a relative.  Near Syncope This is a recurrent problem. The current episode started 3 to 5 hours ago. The problem occurs constantly. The problem has been resolved. Nothing aggravates the symptoms. Nothing relieves the symptoms.       Past Medical History:  Diagnosis Date  . Breast cancer ([place])    left side  . Hypertension   . Thyroid disorder     Patient Active Problem List   Diagnosis Date Noted  . Multiple thyroid nodules 06/01/2018  . Malnutrition of moderate degree 03/30/2018  . Duodenal ulcer 03/29/2018  . Acute blood loss anemia 03/29/2018  . Breast cancer ([place]) 03/28/2018  . Near syncope 03/28/2018  . GIB (gastrointestinal bleeding) 03/28/2018  . Chronic combined systolic and diastolic congestive heart failure ([place]) 01/02/2018  . Essential hypertension 01/02/2018  . Hypercholesterolemia 01/02/2018  . Hyperthyroidism 01/02/2018    Past Surgical History:  Procedure Laterality Date  . ABDOMINAL HYSTERECTOMY    . BREAST LUMPECTOMY Left   . ESOPHAGOGASTRODUODENOSCOPY N/A 03/29/2018   Procedure: ESOPHAGOGASTRODUODENOSCOPY (EGD);  Surgeon: Carol Ada, MD;  Location: [facility];  Service: Endoscopy;  Laterality: N/A;      OB History   No obstetric history on file.     No family history on file.  Social History   Tobacco Use  . Smoking status: Former Smoker    Years: 55.00  . Smokeless tobacco: Never Used  Vaping Use  . Vaping Use: Never used  Substance Use Topics  . Alcohol use: No  . Drug use: No    Home Medications Prior to Admission medications   Medication Sig Start Date End Date Taking? Authorizing Provider  carboxymethylcellulose (REFRESH PLUS) 0.5 % SOLN Place 2 drops into both eyes 3 (three) times daily as needed (for dry eyes).    [provider]  carvedilol (COREG) 6.25 MG tablet Take 1 tablet (6.25 mg total) by mouth 2 (two) times daily with a meal. 01/02/18   Croitoru, Mihai, MD  furosemide (LASIX) 40 MG tablet Take one tablet, 40 mg, three days a week: Monday, Wednesday and Friday 05/03/19   Croitoru, Mihai, MD  losartan (COZAAR) 25 MG tablet Take 1 tablet (25 mg total) by mouth 2 (two) times daily. 12/29/20 12/24/21  Almyra Deforest, PA  Multiple Vitamin (MULTIVITAMIN) tablet Take 1 tablet by mouth daily.    [provider]  pantoprazole (PROTONIX) 40 MG tablet Take 1 tablet by mouth once daily 06/07/19   [name], MD  potassium chloride SA (K-DUR) 20 MEQ tablet Take one tablet on Monday, Wednesday and Friday 05/03/19   Croitoru, Mihai, MD  vitamin B-12 (CYANOCOBALAMIN) 100 MCG tablet Take 100 mcg by mouth daily.    [provider]    Allergies  Patient has no known allergies.  Review of Systems   Review of Systems  Cardiovascular: Positive for near-syncope.    Physical Exam Updated Vital Signs BP (!) 169/91   Pulse 74   Temp 98.5 F (36.9 C) (Oral)   Resp 20   Ht 5\' 5"  (1.651 m)   Wt 62.8 kg   SpO2 96%   BMI 23.04 kg/m   Physical Exam  ED Results / Procedures / Treatments   Labs (all labs ordered are listed, but only abnormal results are displayed) Labs Reviewed  BASIC METABOLIC PANEL - Abnormal; Notable for the following  components:      Result Value   Glucose, Bld 122 (*)    BUN 28 (*)    Creatinine, Ser 1.49 (*)    Calcium 8.3 (*)    GFR, Estimated 34 (*)    All other components within normal limits  CBC - Abnormal; Notable for the following components:   RBC 3.64 (*)    Hemoglobin 10.4 (*)    HCT 35.2 (*)    MCHC 29.5 (*)    All other components within normal limits  URINALYSIS, ROUTINE W REFLEX MICROSCOPIC - Abnormal; Notable for the following components:   Color, Urine STRAW (*)    Specific Gravity, Urine 1.003 (*)    Hgb urine dipstick SMALL (*)    Leukocytes,Ua TRACE (*)    Bacteria, UA RARE (*)    All other components within normal limits  CBG MONITORING, ED    EKG EKG Interpretation  Date/Time:  Wednesday March 14 2021 20:19:39 EDT Ventricular Rate:  71 PR Interval:  166 QRS Duration: 80 QT Interval:  434 QTC Calculation: 471 R Axis:   44 Text Interpretation: Normal sinus rhythm Nonspecific ST abnormality Abnormal ECG Confirmed by Merrily Pew [phone]) on 03/14/2021 11:59:23 PM   Radiology No results found.  Procedures Procedures   Medications Ordered in ED Medications  lactated ringers bolus 1,000 mL (0 mLs Intravenous Stopped 03/15/21 0432)    ED Course  I have reviewed the triage vital signs and the nursing notes.  Pertinent labs & imaging results that were available during my care of the patient were reviewed by me and considered in my medical decision making (see chart for details).    MDM Rules/Calculators/A&P                          Syncopal workup reassuring. Possibly hypovolemia? Low susp for cardiac/neuro causes. Doubt seizure without associated activity.   Final Clinical Impression(s) / ED Diagnoses Final diagnoses:  Near syncope    Rx / DC Orders ED Discharge Orders    None       Nicholi Ghuman, Corene Cornea, MD 03/16/21 [phone]

## 2021-04-02 DIAGNOSIS — R55 Syncope and collapse: Principal | ICD-10-CM | POA: Insufficient documentation

## 2021-04-02 DIAGNOSIS — Z20822 Contact with and (suspected) exposure to covid-19: Secondary | ICD-10-CM | POA: Insufficient documentation

## 2021-04-02 DIAGNOSIS — Z79899 Other long term (current) drug therapy: Secondary | ICD-10-CM | POA: Diagnosis not present

## 2021-04-02 DIAGNOSIS — I130 Hypertensive heart and chronic kidney disease with heart failure and stage 1 through stage 4 chronic kidney disease, or unspecified chronic kidney disease: Secondary | ICD-10-CM | POA: Insufficient documentation

## 2021-04-02 DIAGNOSIS — F0390 Unspecified dementia without behavioral disturbance: Secondary | ICD-10-CM | POA: Diagnosis not present

## 2021-04-02 DIAGNOSIS — I959 Hypotension, unspecified: Secondary | ICD-10-CM | POA: Diagnosis not present

## 2021-04-02 DIAGNOSIS — N1832 Chronic kidney disease, stage 3b: Secondary | ICD-10-CM | POA: Insufficient documentation

## 2021-04-02 DIAGNOSIS — I5042 Chronic combined systolic (congestive) and diastolic (congestive) heart failure: Secondary | ICD-10-CM | POA: Insufficient documentation

## 2021-04-02 DIAGNOSIS — Z853 Personal history of malignant neoplasm of breast: Secondary | ICD-10-CM | POA: Diagnosis not present

## 2021-04-02 DIAGNOSIS — R6889 Other general symptoms and signs: Secondary | ICD-10-CM | POA: Diagnosis not present

## 2021-04-02 DIAGNOSIS — Z87891 Personal history of nicotine dependence: Secondary | ICD-10-CM | POA: Diagnosis not present

## 2021-04-02 DIAGNOSIS — E871 Hypo-osmolality and hyponatremia: Secondary | ICD-10-CM | POA: Diagnosis not present

## 2021-04-02 DIAGNOSIS — R531 Weakness: Secondary | ICD-10-CM | POA: Diagnosis not present

## 2021-04-02 DIAGNOSIS — Z743 Need for continuous supervision: Secondary | ICD-10-CM | POA: Diagnosis not present

## 2021-04-02 DIAGNOSIS — G9389 Other specified disorders of brain: Secondary | ICD-10-CM | POA: Diagnosis not present

## 2021-04-02 NOTE — ED Triage Notes (Signed)
Patient presents due to daugther reporting her getting lethargic x 3 days after taking night time meds.  HX of dementia a&o x 3

## 2021-04-03 ENCOUNTER — Observation Stay (HOSPITAL_COMMUNITY)
Admission: EM | Admit: 2021-04-03 | Discharge: 2021-04-04 | Disposition: A | Discharge status: Home / Self Care | Payer: Medicare Other | Attending: Emergency Medicine | Admitting: Emergency Medicine

## 2021-04-03 ENCOUNTER — Encounter (HOSPITAL_COMMUNITY): Payer: Self-pay | Admitting: Emergency Medicine

## 2021-04-03 ENCOUNTER — Emergency Department (HOSPITAL_COMMUNITY): Payer: Medicare Other

## 2021-04-03 ENCOUNTER — Other Ambulatory Visit: Payer: Self-pay

## 2021-04-03 DIAGNOSIS — I5032 Chronic diastolic (congestive) heart failure: Secondary | ICD-10-CM | POA: Diagnosis not present

## 2021-04-03 DIAGNOSIS — R55 Syncope and collapse: Secondary | ICD-10-CM | POA: Diagnosis not present

## 2021-04-03 DIAGNOSIS — I517 Cardiomegaly: Secondary | ICD-10-CM | POA: Diagnosis not present

## 2021-04-03 DIAGNOSIS — R410 Disorientation, unspecified: Secondary | ICD-10-CM | POA: Diagnosis not present

## 2021-04-03 DIAGNOSIS — G9389 Other specified disorders of brain: Secondary | ICD-10-CM | POA: Diagnosis not present

## 2021-04-03 LAB — COMPREHENSIVE METABOLIC PANEL
ALT: 11 U/L (ref 0–44)
AST: 18 U/L (ref 15–41)
Albumin: 3.2 g/dL — ABNORMAL LOW (ref 3.5–5.0)
Alkaline Phosphatase: 77 U/L (ref 38–126)
Anion gap: 8 (ref 5–15)
BUN: 28 mg/dL — ABNORMAL HIGH (ref 8–23)
CO2: 28 mmol/L (ref 22–32)
Calcium: 9.1 mg/dL (ref 8.9–10.3)
Chloride: 97 mmol/L — ABNORMAL LOW (ref 98–111)
Creatinine, Ser: 1.3 mg/dL — ABNORMAL HIGH (ref 0.44–1.00)
GFR, Estimated: 40 mL/min — ABNORMAL LOW (ref >60)
Glucose, Bld: 126 mg/dL — ABNORMAL HIGH (ref 70–99)
Potassium: 4.6 mmol/L (ref 3.5–5.1)
Sodium: 133 mmol/L — ABNORMAL LOW (ref 135–145)
Total Bilirubin: 0.5 mg/dL (ref 0.3–1.2)
Total Protein: 7.5 g/dL (ref 6.5–8.1)

## 2021-04-03 LAB — CBC
HCT: 33.8 % — ABNORMAL LOW (ref 36.0–46.0)
Hemoglobin: 10.2 g/dL — ABNORMAL LOW (ref 12.0–15.0)
MCH: 28.4 pg (ref 26.0–34.0)
MCHC: 30.2 g/dL (ref 30.0–36.0)
MCV: 94.2 fL (ref 80.0–100.0)
Platelets: 289 10*3/uL (ref 150–400)
RBC: 3.59 MIL/uL — ABNORMAL LOW (ref 3.87–5.11)
RDW: 13 % (ref 11.5–15.5)
WBC: 7.7 10*3/uL (ref 4.0–10.5)
nRBC: 0 % (ref 0.0–0.2)

## 2021-04-03 LAB — URINALYSIS, ROUTINE W REFLEX MICROSCOPIC
Bacteria, UA: NONE SEEN
Bilirubin Urine: NEGATIVE
Glucose, UA: NEGATIVE mg/dL
Ketones, ur: NEGATIVE mg/dL
Nitrite: NEGATIVE
Protein, ur: NEGATIVE mg/dL
Specific Gravity, Urine: 1.009 (ref 1.005–1.030)
pH: 5 (ref 5.0–8.0)

## 2021-04-03 LAB — RESP PANEL BY RT-PCR (FLU A&B, COVID) ARPGX2
Influenza A by PCR: NEGATIVE
Influenza B by PCR: NEGATIVE
SARS Coronavirus 2 by RT PCR: NEGATIVE

## 2021-04-03 LAB — TROPONIN I (HIGH SENSITIVITY)
Troponin I (High Sensitivity): 29 ng/L — ABNORMAL HIGH (ref <18)
Troponin I (High Sensitivity): 28 ng/L — ABNORMAL HIGH (ref <18)

## 2021-04-03 LAB — CBG MONITORING, ED: Glucose-Capillary: 76 mg/dL (ref 70–99)

## 2021-04-03 MED ORDER — CYANOCOBALAMIN 250 MCG PO TABS: 125.0000 ug | ORAL_TABLET | Freq: Every day | ORAL | Status: DC

## 2021-04-03 MED ORDER — SODIUM CHLORIDE 0.9% FLUSH: 3.0000 mL | Freq: Two times a day (BID) | INTRAVENOUS | Status: DC

## 2021-04-03 MED ORDER — PANTOPRAZOLE SODIUM 40 MG PO TBEC: 40.0000 mg | DELAYED_RELEASE_TABLET | Freq: Every day | ORAL | Status: DC

## 2021-04-03 MED ORDER — CARVEDILOL 3.125 MG PO TABS
6.2500 mg | ORAL_TABLET | Freq: Two times a day (BID) | ORAL | Status: DC
  Administered 2021-04-03 – 2021-04-04 (×2): 6.25 mg via ORAL
  Filled 2021-04-03 (×2): qty 2

## 2021-04-03 MED ORDER — ONDANSETRON HCL 4 MG PO TABS: 4.0000 mg | ORAL_TABLET | Freq: Four times a day (QID) | ORAL | Status: DC | PRN

## 2021-04-03 MED ORDER — ENOXAPARIN SODIUM 30 MG/0.3ML IJ SOSY
30.0000 mg | PREFILLED_SYRINGE | INTRAMUSCULAR | Status: DC
  Administered 2021-04-03 – 2021-04-04 (×2): 30 mg via SUBCUTANEOUS
  Filled 2021-04-03 (×2): qty 0.3

## 2021-04-03 MED ORDER — ACETAMINOPHEN 650 MG RE SUPP: 650.0000 mg | Freq: Four times a day (QID) | RECTAL | Status: DC | PRN

## 2021-04-03 MED ORDER — ACETAMINOPHEN 325 MG PO TABS: 650.0000 mg | ORAL_TABLET | Freq: Four times a day (QID) | ORAL | Status: DC | PRN

## 2021-04-03 MED ORDER — POLYVINYL ALCOHOL 1.4 % OP SOLN: 2.0000 [drp] | Freq: Three times a day (TID) | OPHTHALMIC | Status: DC | PRN

## 2021-04-03 MED ORDER — ADULT MULTIVITAMIN W/MINERALS CH
1.0000 | ORAL_TABLET | Freq: Every day | ORAL | Status: DC
  Administered 2021-04-03 – 2021-04-04 (×2): 1 via ORAL
  Filled 2021-04-03 (×2): qty 1

## 2021-04-03 MED ORDER — ONDANSETRON HCL 4 MG/2ML IJ SOLN: 4.0000 mg | Freq: Four times a day (QID) | INTRAMUSCULAR | Status: DC | PRN

## 2021-04-03 MED ORDER — LOSARTAN POTASSIUM 50 MG PO TABS
25.0000 mg | ORAL_TABLET | Freq: Two times a day (BID) | ORAL | Status: DC
  Administered 2021-04-03 – 2021-04-04 (×3): 25 mg via ORAL
  Filled 2021-04-03 (×3): qty 1

## 2021-04-03 NOTE — ED Provider Notes (Signed)
[facility] EMERGENCY DEPARTMENT Provider Note   CSN: [id number] Arrival date & time: 04/02/21  2216     History Chief Complaint  Patient presents with   Fatigue   Weakness    Diane Ross is a 87 y.o. female.   Weakness  This patient is a 87 year old female, she has a history of hyperthyroidism, hypercholesterolemia, hypertension, she has known congestive heart failure, she has had known breast cancer.  She currently presents to the hospital with a complaint of a syncopal event.  This occurred just prior to arrival, last night after she had gotten out of the shower and was sitting on the edge of the bed.  A family member walked by her bedroom and saw her sitting on the bed, she appeared uncomfortable, she appeared unresponsive, she was diaphoretic and she had difficulty waking her up.  Since being in the emergency department she has not had recurrence of the symptoms however the family member states that this event has happened a couple of times over the last month each of these times appears the same with her being unresponsive sweaty and then gradually getting back to normal.  There was no seizure activity seen by the family member, there is no urinary incontinence.  The patient herself does not have any specific answers to what is going on and cannot remember the exact symptoms.  At this time she denies chest pain shortness of breath fevers chills nausea vomiting or diarrhea.  Past Medical History:  Diagnosis Date   Breast cancer ([place])    left side   Hypertension    Thyroid disorder     Patient Active Problem List   Diagnosis Date Noted   Multiple thyroid nodules 06/01/2018   Malnutrition of moderate degree 03/30/2018   Duodenal ulcer 03/29/2018   Acute blood loss anemia 03/29/2018   Breast cancer ([place]) 03/28/2018   Syncope, vasovagal 03/28/2018   GIB (gastrointestinal bleeding) 03/28/2018   Chronic combined systolic and diastolic congestive heart  failure ([place]) 01/02/2018   Essential hypertension 01/02/2018   Hypercholesterolemia 01/02/2018   Hyperthyroidism 01/02/2018    Past Surgical History:  Procedure Laterality Date   ABDOMINAL HYSTERECTOMY     BREAST LUMPECTOMY Left    ESOPHAGOGASTRODUODENOSCOPY N/A 03/29/2018   Procedure: ESOPHAGOGASTRODUODENOSCOPY (EGD);  Surgeon: Carol Ada, MD;  Location: [facility];  Service: Endoscopy;  Laterality: N/A;     OB History   No obstetric history on file.     No family history on file.  Social History   Tobacco Use   Smoking status: Former    Years: 55.00    Pack years: 0.00    Types: Cigarettes   Smokeless tobacco: Never  Vaping Use   Vaping Use: Never used  Substance Use Topics   Alcohol use: No   Drug use: No    Home Medications Prior to Admission medications   Medication Sig Start Date End Date Taking? Authorizing Provider  carboxymethylcellulose (REFRESH PLUS) 0.5 % SOLN Place 2 drops into both eyes 3 (three) times daily as needed (for dry eyes).    [provider]  carvedilol (COREG) 6.25 MG tablet Take 1 tablet (6.25 mg total) by mouth 2 (two) times daily with a meal. 01/02/18   Croitoru, Mihai, MD  furosemide (LASIX) 40 MG tablet Take one tablet, 40 mg, three days a week: Monday, Wednesday and Friday 05/03/19   Croitoru, Mihai, MD  losartan (COZAAR) 25 MG tablet Take 1 tablet (25 mg total) by mouth 2 (two)  times daily. 12/29/20 12/24/21  Almyra Deforest, PA  Multiple Vitamin (MULTIVITAMIN) tablet Take 1 tablet by mouth daily.    [provider]  pantoprazole (PROTONIX) 40 MG tablet Take 1 tablet by mouth once daily 06/07/19   [name], MD  potassium chloride SA (K-DUR) 20 MEQ tablet Take one tablet on Monday, Wednesday and Friday 05/03/19   Croitoru, Mihai, MD  vitamin B-12 (CYANOCOBALAMIN) 100 MCG tablet Take 100 mcg by mouth daily.    [provider]    Allergies    Patient has no known allergies.  Review of Systems   Review of  Systems  Neurological:  Positive for weakness.  All other systems reviewed and are negative.  Physical Exam Updated Vital Signs BP (!) 171/90 (BP Location: Right Arm)   Pulse 75   Temp 98.7 F (37.1 C) (Oral)   Resp 16   Ht 1.651 m (5\' 5" )   Wt 61.2 kg   SpO2 100%   BMI 22.47 kg/m   Physical Exam Vitals and nursing note reviewed.  Constitutional:      General: She is not in acute distress.    Appearance: She is well-developed.  HENT:     Head: Normocephalic and atraumatic.     Mouth/Throat:     Pharynx: No oropharyngeal exudate.  Eyes:     General: No scleral icterus.       Right eye: No discharge.        Left eye: No discharge.     Conjunctiva/sclera: Conjunctivae normal.     Pupils: Pupils are equal, round, and reactive to light.  Neck:     Thyroid: No thyromegaly.     Vascular: No JVD.  Cardiovascular:     Rate and Rhythm: Normal rate and regular rhythm.     Heart sounds: Normal heart sounds. No murmur heard.   No friction rub. No gallop.  Pulmonary:     Effort: Pulmonary effort is normal. No respiratory distress.     Breath sounds: Normal breath sounds. No wheezing or rales.  Abdominal:     General: Bowel sounds are normal. There is no distension.     Palpations: Abdomen is soft. There is no mass.     Tenderness: There is no abdominal tenderness.  Musculoskeletal:        General: No tenderness. Normal range of motion.     Cervical back: Normal range of motion and neck supple.     Right lower leg: No edema.     Left lower leg: No edema.  Lymphadenopathy:     Cervical: No cervical adenopathy.  Skin:    General: Skin is warm and dry.     Findings: No erythema or rash.  Neurological:     Mental Status: She is alert.     Coordination: Coordination normal.     Comments: Normal facial symmetry, normal speech, using all 4 extremities with normal strength, normal coordination  Psychiatric:        Behavior: Behavior normal.    ED Results / Procedures /  Treatments   Labs (all labs ordered are listed, but only abnormal results are displayed) Labs Reviewed  CBC - Abnormal; Notable for the following components:      Result Value   RBC 3.59 (*)    Hemoglobin 10.2 (*)    HCT 33.8 (*)    All other components within normal limits  COMPREHENSIVE METABOLIC PANEL - Abnormal; Notable for the following components:   Sodium 133 (*)  Chloride 97 (*)    Glucose, Bld 126 (*)    BUN 28 (*)    Creatinine, Ser 1.30 (*)    Albumin 3.2 (*)    GFR, Estimated 40 (*)    All other components within normal limits  URINALYSIS, ROUTINE W REFLEX MICROSCOPIC - Abnormal; Notable for the following components:   Color, Urine STRAW (*)    APPearance CLOUDY (*)    Hgb urine dipstick MODERATE (*)    Leukocytes,Ua SMALL (*)    All other components within normal limits  TROPONIN I (HIGH SENSITIVITY) - Abnormal; Notable for the following components:   Troponin I (High Sensitivity) 29 (*)    All other components within normal limits  TROPONIN I (HIGH SENSITIVITY) - Abnormal; Notable for the following components:   Troponin I (High Sensitivity) 28 (*)    All other components within normal limits  RESP PANEL BY RT-PCR (FLU A&B, COVID) [id number]  CBG MONITORING, ED    EKG EKG Interpretation  Date/Time:  Tuesday April 03 2021 02:39:15 EDT Ventricular Rate:  65 PR Interval:  194 QRS Duration: 94 QT Interval:  458 QTC Calculation: 476 R Axis:   47 Text Interpretation: Normal sinus rhythm with sinus arrhythmia Minimal voltage criteria for LVH, may be normal variant ( Sokolow-Lyon ) Borderline ECG When compared with ECG of 03/14/2021, No significant change was found Confirmed by Delora Fuel ([id number]) on 04/03/2021 4:16:34 AM  Radiology CT Head Wo Contrast  Result Date: 04/03/2021 CLINICAL DATA:  Lethargy.  History of dementia EXAM: CT HEAD WITHOUT CONTRAST TECHNIQUE: Contiguous axial images were obtained from the base of the skull through the vertex without intravenous  contrast. COMPARISON:  None. FINDINGS: Brain: No evidence of acute infarction, hemorrhage, hydrocephalus, extra-axial collection or mass lesion/mass effect. Generalized atrophy. 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 Vascular: No hyperdense vessel or unexpected calcification. Skull: Normal. Negative for fracture or focal lesion. Sinuses/Orbits: No acute finding. IMPRESSION: No acute or reversible finding. Electronically Signed   By: Monte Fantasia M.D.   On: 04/03/2021 04:09    Procedures Procedures   Medications Ordered in ED Medications - No data to display  ED Course  I have reviewed the triage vital signs and the nursing notes.  Pertinent labs & imaging results that were available during my care of the patient were reviewed by me and considered in my medical decision making (see chart for details).    MDM Rules/Calculators/A&P                          The patient takes medications including losartan, Lasix, potassium supplementation and Coreg.  At this time she has no symptoms however what appears to be some significant event has been occurring intermittently over the month, she has a history of congestive heart failure she is hypertensive and is higher risk for abnormal cardiac outcome.  At this time we will plan for urinalysis, labs and possibly admission for observation and cardiac monitoring.  Urinalysis reveals 6-10 white blood cells, small leukocytes, no bacteria, culture will be sent troponin is 29 and 28, there is no uptrending troponin.  CBC shows chronic mild anemia but no other acute findings and metabolic panel reveals a creatinine which seems close to baseline at 1.3 with a BUN of 28 and rather normal electrolytes.  She is COVID-negative, the CT scan of the head reveals no signs of acute ischemic hemorrhagic or masslike findings.  Her EKG does not reveal  any acute arrhythmias.  There is concern that this may be related to an arrhythmia, she will be admitted to  the hospital  D/w Dr. Blaine Hamper - will admit  Final Clinical Impression(s) / ED Diagnoses Final diagnoses:  Syncope, unspecified syncope type     Noemi Chapel, MD 04/03/21 1257

## 2021-04-03 NOTE — ED Provider Notes (Signed)
Emergency Medicine Provider Triage Evaluation Note  Diane Ross , a 87 y.o. female  was evaluated in triage.  Pt complains of syncopal episode.  She states she stood up and passed out.  Has felt weak for the past few days.  Denies any pain now.  Review of Systems  Positive: Syncope, fatigue Negative: CP, headache  Physical Exam  BP (!) 177/92 (BP Location: Right Arm)   Pulse 61   Temp 98 F (36.7 C)   Resp 16   SpO2 97%  Gen:   Awake, no distress   Resp:  Normal effort  MSK:   Moves extremities without difficulty  Other:    Medical Decision Making  Medically screening exam initiated at 2:18 AM.  Appropriate orders placed.  Luke Rigsbee was informed that the remainder of the evaluation will be completed by another provider, this initial triage assessment does not replace that evaluation, and the importance of remaining in the ED until their evaluation is complete.     Montine Circle, PA-C 04/03/21 5830    Merryl Hacker, MD 04/03/21 [phone]

## 2021-04-03 NOTE — ED Notes (Signed)
Family updated as to patient's status.

## 2021-04-03 NOTE — H&P (Signed)
History and Physical    Diane Ross NWG:[id number] DOB: 1930-10-20 DOA: 04/03/2021  PCP: [name], NP (Inactive) (Confirm with patient/family/NH records and if not entered, this has to be entered at [facility] point of entry) Patient coming from: Home  I have personally briefly reviewed patient's old medical records in [place]  Chief Complaint: I passed out.  HPI: Diane Ross is a 87 y.o. female with medical history significant of recurrent syncope, thyroid nodules, HTN, chronic diastolic CHF, CKD stage II, mild dementia came with syncope episode x2.  Daughter at bedside gave most of the history.  Patient had 2 episodes of syncope the day before yesterday and last night.  Both occasions after patient took her evening dose of Coreg.  Last night, patient ate dinner and came out of the shower and took her usual evening dose of Coreg then daughter found the patient slumped to side and unresponsive.  Patient appeared to be sweaty and pale, and daughter could not feel her pulses.  After few minutes, patient recovered by her own, oriented, no loss control of urine or bowel movement no tongue bite.  Daughter reported minor fingertip twitching during the episode but no whole body shaking.  And patient behaved normally at the time.  Currently, patient has no complaint, she does not remember she had any prodromes such as lightheaded, palpitations or sweating or nauseous before the episode.  3 months ago, patient had similar episode and went to see cardiologist who performed echocardiogram showed a normal LVEF, no significant valvular issue.  Patient underwent 1 week of Holter monitoring which showed no significant arrhythmia.  Patient has a chronic isthmus thyroid nodule, post FNA in 2019 which showed a benign pathology.  She been following with endocrinologist every year, she denied palpitations, diarrhea weight loss recently. ED Course: CT head negative for acute findings, BMP CBC largely  normal.  Review of Systems: As per HPI otherwise 14 point review of systems negative.    Past Medical History:  Diagnosis Date   Breast cancer ([facility])    left side   Hypertension    Thyroid disorder     Past Surgical History:  Procedure Laterality Date   ABDOMINAL HYSTERECTOMY     BREAST LUMPECTOMY Left    ESOPHAGOGASTRODUODENOSCOPY N/A 03/29/2018   Procedure: ESOPHAGOGASTRODUODENOSCOPY (EGD);  Surgeon: Carol Ada, MD;  Location: [facility];  Service: Endoscopy;  Laterality: N/A;     reports that she has quit smoking. She has never used smokeless tobacco. She reports that she does not drink alcohol and does not use drugs.  No Known Allergies  No family history on file.   Prior to Admission medications   Medication Sig Start Date End Date Taking? Authorizing Provider  carboxymethylcellulose (REFRESH PLUS) 0.5 % SOLN Place 2 drops into both eyes 3 (three) times daily as needed (for dry eyes).    [provider]  carvedilol (COREG) 6.25 MG tablet Take 1 tablet (6.25 mg total) by mouth 2 (two) times daily with a meal. 01/02/18   Croitoru, Mihai, MD  furosemide (LASIX) 40 MG tablet Take one tablet, 40 mg, three days a week: Monday, Wednesday and Friday 05/03/19   Croitoru, Mihai, MD  losartan (COZAAR) 25 MG tablet Take 1 tablet (25 mg total) by mouth 2 (two) times daily. 12/29/20 12/24/21  Almyra Deforest, PA  Multiple Vitamin (MULTIVITAMIN) tablet Take 1 tablet by mouth daily.    [provider]  pantoprazole (PROTONIX) 40 MG tablet Take 1 tablet by mouth once  daily 06/07/19   [name], MD  potassium chloride SA (K-DUR) 20 MEQ tablet Take one tablet on Monday, Wednesday and Friday 05/03/19   Croitoru, Mihai, MD  vitamin B-12 (CYANOCOBALAMIN) 100 MCG tablet Take 100 mcg by mouth daily.    [provider]    Physical Exam: Vitals:   04/03/21 1148 04/03/21 1150 04/03/21 1200 04/03/21 1201  BP: (!) 165/96  (!) 171/90 (!) 171/90  Pulse:  68  75  Resp:  20   16  Temp:      TempSrc:      SpO2:  99%  100%  Weight:      Height:        Constitutional: NAD, calm, comfortable Vitals:   04/03/21 1148 04/03/21 1150 04/03/21 1200 04/03/21 1201  BP: (!) 165/96  (!) 171/90 (!) 171/90  Pulse:  68  75  Resp:  20  16  Temp:      TempSrc:      SpO2:  99%  100%  Weight:      Height:       Eyes: PERRL, lids and conjunctivae normal ENMT: Mucous membranes are moist. Posterior pharynx clear of any exudate or lesions.Normal dentition.  Neck: normal, supple, no masses, thyroid nodule about 3 x 5 cm, no bruits or tenderness.  Soft and movable. Respiratory: clear to auscultation bilaterally, no wheezing, no crackles. Normal respiratory effort. No accessory muscle use.  Cardiovascular: Regular rate and rhythm, no murmurs / rubs / gallops. No extremity edema. 2+ pedal pulses. No carotid bruits.  Abdomen: no tenderness, no masses palpated. No hepatosplenomegaly. Bowel sounds positive.  Musculoskeletal: no clubbing / cyanosis. No joint deformity upper and lower extremities. Good ROM, no contractures. Normal muscle tone.  Skin: no rashes, lesions, ulcers. No induration Neurologic: CN 2-12 grossly intact. Sensation intact, DTR normal. Strength 5/5 in all 4.  Psychiatric: Normal judgment and insight. Alert and oriented x 3. Normal mood.     Labs on Admission: I have personally reviewed following labs and imaging studies  CBC: Recent Labs  Lab 04/03/21 0234  WBC 7.7  HGB 10.2*  HCT 33.8*  MCV 94.2  PLT 644   Basic Metabolic Panel: Recent Labs  Lab 04/03/21 0234  NA 133*  K 4.6  CL 97*  CO2 28  GLUCOSE 126*  BUN 28*  CREATININE 1.30*  CALCIUM 9.1   GFR: Estimated Creatinine Clearance: 27.4 mL/min (A) (by C-G formula based on SCr of 1.3 mg/dL (H)). Liver Function Tests: Recent Labs  Lab 04/03/21 0234  AST 18  ALT 11  ALKPHOS 77  BILITOT 0.5  PROT 7.5  ALBUMIN 3.2*   No results for input(s): LIPASE, AMYLASE in the last 168 hours. No  results for input(s): AMMONIA in the last 168 hours. Coagulation Profile: No results for input(s): INR, PROTIME in the last 168 hours. Cardiac Enzymes: No results for input(s): CKTOTAL, CKMB, CKMBINDEX, TROPONINI in the last 168 hours. BNP (last 3 results) No results for input(s): PROBNP in the last 8760 hours. HbA1C: No results for input(s): HGBA1C in the last 72 hours. CBG: Recent Labs  Lab 04/03/21 1233  GLUCAP 76   Lipid Profile: No results for input(s): CHOL, HDL, LDLCALC, TRIG, CHOLHDL, LDLDIRECT in the last 72 hours. Thyroid Function Tests: No results for input(s): TSH, T4TOTAL, FREET4, T3FREE, THYROIDAB in the last 72 hours. Anemia Panel: No results for input(s): VITAMINB12, FOLATE, FERRITIN, TIBC, IRON, RETICCTPCT in the last 72 hours. Urine analysis:    Component Value  Date/Time   COLORURINE STRAW (A) 04/03/2021 0530   APPEARANCEUR CLOUDY (A) 04/03/2021 0530   LABSPEC 1.009 04/03/2021 0530   PHURINE 5.0 04/03/2021 0530   GLUCOSEU NEGATIVE 04/03/2021 0530   HGBUR MODERATE (A) 04/03/2021 0530   BILIRUBINUR NEGATIVE 04/03/2021 0530   KETONESUR NEGATIVE 04/03/2021 0530   PROTEINUR NEGATIVE 04/03/2021 0530   NITRITE NEGATIVE 04/03/2021 0530   LEUKOCYTESUR SMALL (A) 04/03/2021 0530    Radiological Exams on Admission: CT Head Wo Contrast  Result Date: 04/03/2021 CLINICAL DATA:  Lethargy.  History of dementia EXAM: CT HEAD WITHOUT CONTRAST TECHNIQUE: Contiguous axial images were obtained from the base of the skull through the vertex without intravenous contrast. COMPARISON:  None. FINDINGS: Brain: No evidence of acute infarction, hemorrhage, hydrocephalus, extra-axial collection or mass lesion/mass effect. Generalized atrophy. 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 Vascular: No hyperdense vessel or unexpected calcification. Skull: Normal. Negative for fracture or focal lesion. Sinuses/Orbits: No acute finding. IMPRESSION: No acute or reversible finding.  Electronically Signed   By: Monte Fantasia M.D.   On: 04/03/2021 04:09    EKG: Independently reviewed.  LVH, no PR or QTc interval changes  Assessment/Plan Active Problems:   Syncope, vasovagal   Syncope  (please populate well all problems here in Problem List. (For example, if patient is on BP meds at home and you resume or decide to hold them, it is a problem that needs to be her. Same for CAD, COPD, HLD and so on)  Syncope -Appears to have element of vasovagal reaction given appearance of cold and clammy and sweaty and pulses were weak at home.  Family however did not measure patient's blood pressure at that point.  Telemetry monitoring, orthostatic vital signs twice daily x2. -Holter monitoring with Zio patch was done 3 months ago.  We will check TSH, T3 and T4.  [facility] ED record, ED monitor does not show any episodes of A. fib. -Seizure unlikely, will order a baseline EEG. -PT evaluation  HTN -Continue Coreg and ARB hold Lasix  Chronic systolic CHF -Mild dehydration, hold Lasix.  CKD stage II -Euvolemic, continue ARB, hold Lasix.  Thyroid nodule -Check thyroid function, outpatient follow-up with endocrinology and repeat thyroid ultrasound outpatient.  DVT prophylaxis: Lovenox Code Status: Full code Family Communication: Daughter at bedside Disposition Plan: Expect less than 2 midnight hospital stay Consults called: None Admission status: Telemetry observation   Lequita Halt MD [facility] Pager [phone]  04/03/2021, 1:10 PM

## 2021-04-04 ENCOUNTER — Observation Stay (HOSPITAL_COMMUNITY): Payer: Medicare Other

## 2021-04-04 DIAGNOSIS — F0390 Unspecified dementia without behavioral disturbance: Secondary | ICD-10-CM | POA: Diagnosis not present

## 2021-04-04 DIAGNOSIS — R55 Syncope and collapse: Secondary | ICD-10-CM

## 2021-04-04 DIAGNOSIS — I5032 Chronic diastolic (congestive) heart failure: Secondary | ICD-10-CM | POA: Diagnosis not present

## 2021-04-04 LAB — CBG MONITORING, ED
Glucose-Capillary: 64 mg/dL — ABNORMAL LOW (ref 70–99)
Glucose-Capillary: 122 mg/dL — ABNORMAL HIGH (ref 70–99)

## 2021-04-04 LAB — BASIC METABOLIC PANEL
Anion gap: 9 (ref 5–15)
BUN: 27 mg/dL — ABNORMAL HIGH (ref 8–23)
CO2: 29 mmol/L (ref 22–32)
Calcium: 9.4 mg/dL (ref 8.9–10.3)
Chloride: 96 mmol/L — ABNORMAL LOW (ref 98–111)
Creatinine, Ser: 1.23 mg/dL — ABNORMAL HIGH (ref 0.44–1.00)
GFR, Estimated: 43 mL/min — ABNORMAL LOW (ref >60)
Glucose, Bld: 89 mg/dL (ref 70–99)
Potassium: 4.3 mmol/L (ref 3.5–5.1)
Sodium: 134 mmol/L — ABNORMAL LOW (ref 135–145)

## 2021-04-04 MED ORDER — LOSARTAN POTASSIUM 25 MG PO TABS
25.0000 mg | ORAL_TABLET | Freq: Every day | ORAL | 1 refills | Status: DC
Start: 2021-04-04 — End: 2021-05-24

## 2021-04-04 NOTE — Discharge Planning (Signed)
RNCM consulted regarding home health services.  RNCM referred pt to multiple home health agencies.  Will update family when agency accepts referral.

## 2021-04-04 NOTE — ED Notes (Signed)
Patient has pleasant disposition and smiles with talking with this RN. Patient denies pain, SOB, dizziness, or nausea. States she has not had breakfast. Patient was offered and provided graham crackers and PO fluids until lunch arrives. SR up X 2 for safety. Continuous pulse oximetry and intermittent NIBP in place. Call bell within reach.

## 2021-04-04 NOTE — Procedures (Signed)
Patient Name: Diane Ross  MRN: [id number]  Epilepsy Attending: Lora Havens  Referring Physician/Provider: Dr Wynetta Fines Date: 04/04/2021 Duration: 23.46 mins  Patient history: 87 year old female with episode of syncope.  EEG to evaluate for seizures.  Level of alertness: Awake, asleep  AEDs during EEG study: None  Technical aspects: This EEG study was done with scalp electrodes positioned according to the 10-20 International system of electrode placement. Electrical activity was acquired at a sampling rate of 500Hz  and reviewed with a high frequency filter of 70Hz  and a low frequency filter of 1Hz . EEG data were recorded continuously and digitally stored.   Description: The posterior dominant rhythm consists of 8.5 Hz activity of moderate voltage (25-35 uV) seen predominantly in posterior head regions, symmetric and reactive to eye opening and eye closing. Sleep was characterized by vertex waves, sleep spindles (12 to 14 Hz), maximal frontocentral region. Hyperventilation and photic stimulation were not performed.     IMPRESSION: This study is within normal limits. No seizures or epileptiform discharges were seen throughout the recording.  Kalanie Fewell Barbra Sarks

## 2021-04-04 NOTE — ED Notes (Signed)
Pt reported no dizziness during orthostatic vitals but did report some shakiness and weakness.

## 2021-04-04 NOTE — ED Notes (Signed)
Patient was assisted with getting dressed by this RN and an ED Tech. Patient was able to transfer herself from stretcher to [facility] with standby assistance. Patient is alert and generally oriented at time of DC. Discharged to care of daughter for transport home.

## 2021-04-04 NOTE — Discharge Summary (Signed)
Physician Discharge Summary  Diane Ross PYP:[id number] DOB: 1931-06-10 DOA: 04/03/2021  PCP: [name], NP (Inactive)  Admit date: 04/03/2021 Discharge date: 04/04/2021  Time spent: 45 minutes  Recommendations for Outpatient Follow-up:  Patient will be discharged to home with home health services.  Patient will need to follow up with primary care provider within one week of discharge.  Patient should continue medications as prescribed.  Patient should follow a heart healthy diet.   Discharge Diagnoses:  Syncope Essential hypertension Chronic systolic CHF Chronic kidney disease, stage IIIb Thyroid nodule Hyponatremia Dementia  Discharge Condition: stable  Diet recommendation: heart healthy  Filed Weights   04/03/21 0220  Weight: 61.2 kg    History of present illness:  On 04/03/2021 by Dr. Wynetta Fines Diane Ross is a 86 y.o. female with medical history significant of recurrent syncope, thyroid nodules, HTN, chronic diastolic CHF, CKD stage II, mild dementia came with syncope episode x2.   Daughter at bedside gave most of the history.  Patient had 2 episodes of syncope the day before yesterday and last night.  Both occasions after patient took her evening dose of Coreg.  Last night, patient ate dinner and came out of the shower and took her usual evening dose of Coreg then daughter found the patient slumped to side and unresponsive.  Patient appeared to be sweaty and pale, and daughter could not feel her pulses.  After few minutes, patient recovered by her own, oriented, no loss control of urine or bowel movement no tongue bite.  Daughter reported minor fingertip twitching during the episode but no whole body shaking.  And patient behaved normally at the time.  Currently, patient has no complaint, she does not remember she had any prodromes such as lightheaded, palpitations or sweating or nauseous before the episode.   3 months ago, patient had similar episode and went to see  cardiologist who performed echocardiogram showed a normal LVEF, no significant valvular issue.  Patient underwent 1 week of Holter monitoring which showed no significant arrhythmia.   Patient has a chronic isthmus thyroid nodule, post FNA in 2019 which showed a benign pathology.  She been following with endocrinologist every year, she denied palpitations, diarrhea weight loss recently.  Hospital Course:  Syncope -Suspect secondary to vasovagal reaction and medications -Per conversation with daughter, it usually occurs approximately 1 hour after taking her blood pressure medications usually in the evenings -Orthostatic vitals were unremarkable -EEG unremarkable -Patient has had extensive work-up in the past including Zio patch and echocardiogram (EF 24%, grade 1 diastolic dysfunction, trivial MVR, AVR and mild aortic valve sclerosis) -Will adjust blood pressure medications and have discussed this with the daughter -PT recommending home health -TOC consulted  Essential hypertension -As above, will adjust patient's blood pressure medications and space them out  Chronic systolic CHF -Currently appears to be compensated and euvolemic -Echocardiogram as above -Continue home Lasix dosing on discharge  Chronic kidney disease, stage IIIb -Stable  Thyroid nodule -Follow-up as an outpatient  Hyponatremia -stable, sodium 134 -repeat BMP in one week  Dementia -continue home meds  Procedures: None  Consultations: None   Discharge Exam: Vitals:   04/04/21 1050 04/04/21 1345  BP: 115/72 119/63  Pulse: 68 84  Resp: 14 (!) 21  Temp:    SpO2: 100% 100%    General: Well developed, well nourished, elderly, NAD, appears stated age HEENT: NCAT, mucous membranes moist. Cardiovascular: S1 S2 auscultated, RRR Respiratory: Clear to auscultation bilaterally  Abdomen: Soft, nontender, nondistended, + bowel  sounds Extremities: warm dry without cyanosis clubbing or edema Neuro: AAOx2  (self, place), dementia, nonfocal Psych: pleasant, appropriate mood and affect  Discharge Instructions Discharge Instructions     Diet - low sodium heart healthy   Complete by: As directed    Discharge instructions   Complete by: As directed    Patient will be discharged to home with home health services.  Patient will need to follow up with primary care provider within one week of discharge.  Patient should continue medications as prescribed.  Patient should follow a heart healthy diet.   Increase activity slowly   Complete by: As directed       Allergies as of 04/04/2021   No Known Allergies      Medication List     STOP taking these medications    pantoprazole 40 MG tablet Commonly known as: PROTONIX       TAKE these medications    atorvastatin 20 MG tablet Commonly known as: LIPITOR Take 20 mg by mouth daily.   carboxymethylcellulose 0.5 % Soln Commonly known as: REFRESH PLUS Place 2 drops into both eyes 3 (three) times daily as needed (for dry eyes).   carvedilol 6.25 MG tablet Commonly known as: COREG Take 1 tablet (6.25 mg total) by mouth 2 (two) times daily with a meal.   donepezil 10 MG tablet Commonly known as: ARICEPT Take 10 mg by mouth daily.   furosemide 40 MG tablet Commonly known as: LASIX Take one tablet, 40 mg, three days a week: Monday, Wednesday and Friday   losartan 25 MG tablet Commonly known as: COZAAR Take 1 tablet (25 mg total) by mouth daily. Would take in the afternoon. What changed:  when to take this additional instructions   memantine 5 MG tablet Commonly known as: NAMENDA Take 5 mg by mouth daily.   mirtazapine 7.5 MG tablet Commonly known as: REMERON Take 7.5 mg by mouth at bedtime.   multivitamin tablet Take 1 tablet by mouth daily.   potassium chloride SA 20 MEQ tablet Commonly known as: KLOR-CON Take one tablet on Monday, Wednesday and Friday       No Known Allergies  Follow-up Information     [name], NP. Schedule an appointment as soon as possible for a visit in 1 week(s).   Specialty: Nurse Practitioner Why: Hospital follow up Contact information: [place] [place] [address] [phone]         Sanda Klein, MD .   Specialty: Cardiology Contact information: [address] [place]  [address] [phone]                  The results of significant diagnostics from this hospitalization (including imaging, microbiology, ancillary and laboratory) are listed below for reference.    Significant Diagnostic Studies: CT Head Wo Contrast  Result Date: 04/03/2021 CLINICAL DATA:  Lethargy.  History of dementia EXAM: CT HEAD WITHOUT CONTRAST TECHNIQUE: Contiguous axial images were obtained from the base of the skull through the vertex without intravenous contrast. COMPARISON:  None. FINDINGS: Brain: No evidence of acute infarction, hemorrhage, hydrocephalus, extra-axial collection or mass lesion/mass effect. Generalized atrophy. 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 Vascular: No hyperdense vessel or unexpected calcification. Skull: Normal. Negative for fracture or focal lesion. Sinuses/Orbits: No acute finding. IMPRESSION: No acute or reversible finding. Electronically Signed   By: Monte Fantasia M.D.   On: 04/03/2021 04:09   DG Chest [facility] 1 View  Result  Date: 04/03/2021 CLINICAL DATA:  Confusion. EXAM: PORTABLE CHEST 1 VIEW COMPARISON:  11/19/2017. FINDINGS: Question shift of the trachea to the right. Borderline cardiomegaly, no pulmonary venous congestion. Retrocardiac density on the right cannot be excluded. Standard PA and lateral chest x-ray suggested for further evaluation. Elevation of the left hemi diaphragm noted. Interposition of the colon under the left hemidiaphragm again noted. No acute bony abnormality. IMPRESSION: 1. Retrocardiac density on the right cannot be excluded. Standard PA and lateral chest  x-ray suggested for further evaluation. Question shift of the trachea to the right. This can also be further evaluated with PA and lateral chest x-ray. 2.  Elevation left hemidiaphragm. Electronically Signed   By: Marcello Moores  Register   On: 04/03/2021 13:30   EEG adult  Result Date: 04/04/2021 Lora Havens, MD     04/04/2021  8:35 AM Patient Name: Kamarie Veno MRN: [id number] Epilepsy Attending: Lora Havens Referring Physician/Provider: Dr Wynetta Fines Date: 04/04/2021 Duration: 23.46 mins Patient history: 87 year old female with episode of syncope.  EEG to evaluate for seizures. Level of alertness: Awake, asleep AEDs during EEG study: None Technical aspects: This EEG study was done with scalp electrodes positioned according to the 10-20 International system of electrode placement. Electrical activity was acquired at a sampling rate of 500Hz  and reviewed with a high frequency filter of 70Hz  and a low frequency filter of 1Hz . EEG data were recorded continuously and digitally stored. Description: The posterior dominant rhythm consists of 8.5 Hz activity of moderate voltage (25-35 uV) seen predominantly in posterior head regions, symmetric and reactive to eye opening and eye closing. Sleep was characterized by vertex waves, sleep spindles (12 to 14 Hz), maximal frontocentral region. Hyperventilation and photic stimulation were not performed.   IMPRESSION: This study is within normal limits. No seizures or epileptiform discharges were seen throughout the recording. Lora Havens    Microbiology: Recent Results (from the past 240 hour(s))  Resp Panel by RT-PCR (Flu A&B, Covid) Nasopharyngeal Swab     Status: None   Collection Time: 04/03/21  2:28 AM   Specimen: Nasopharyngeal Swab; Nasopharyngeal(NP) swabs in vial transport medium  Result Value Ref Range Status   SARS Coronavirus 2 by RT PCR NEGATIVE NEGATIVE Final    Comment: (NOTE) SARS-CoV-2 target nucleic acids are NOT DETECTED.  The SARS-CoV-2  RNA is generally detectable in upper respiratory specimens during the acute phase of infection. The lowest concentration of SARS-CoV-2 viral copies this assay can detect is 138 copies/mL. A negative result does not preclude SARS-Cov-2 infection and should not be used as the sole basis for treatment or other patient management decisions. A negative result may occur with  improper specimen collection/handling, submission of specimen other than nasopharyngeal swab, presence of viral mutation(s) within the areas targeted by this assay, and inadequate number of viral copies(<138 copies/mL). A negative result must be combined with clinical observations, patient history, and epidemiological information. The expected result is Negative.  Fact Sheet for Patients:  EntrepreneurPulse.com.au  Fact Sheet for Healthcare Providers:  IncredibleEmployment.be  This test is no t yet approved or cleared by the [place] FDA and  has been authorized for detection and/or diagnosis of SARS-CoV-2 by FDA under an Emergency Use Authorization (EUA). This EUA will remain  in effect (meaning this test can be used) for the duration of the COVID-19 declaration under Section 564(b)(1) of the Act, 21 U.S.C.section 360bbb-3(b)(1), unless the authorization is terminated  or revoked sooner.       Influenza A  by PCR NEGATIVE NEGATIVE Final   Influenza B by PCR NEGATIVE NEGATIVE Final    Comment: (NOTE) The Xpert Xpress SARS-CoV-2/FLU/RSV plus assay is intended as an aid in the diagnosis of influenza from Nasopharyngeal swab specimens and should not be used as a sole basis for treatment. Nasal washings and aspirates are unacceptable for Xpert Xpress SARS-CoV-2/FLU/RSV testing.  Fact Sheet for Patients: EntrepreneurPulse.com.au  Fact Sheet for Healthcare Providers: IncredibleEmployment.be  This test is not yet approved or cleared by the  [place] FDA and has been authorized for detection and/or diagnosis of SARS-CoV-2 by FDA under an Emergency Use Authorization (EUA). This EUA will remain in effect (meaning this test can be used) for the duration of the COVID-19 declaration under Section 564(b)(1) of the Act, 21 U.S.C. section 360bbb-3(b)(1), unless the authorization is terminated or revoked.  Performed at [facility], [place] [address] [place], [place] [address]      Labs: Basic Metabolic Panel: Recent Labs  Lab 04/03/21 0234 04/04/21 0422  NA 133* 134*  K 4.6 4.3  CL 97* 96*  CO2 28 29  GLUCOSE 126* 89  BUN 28* 27*  CREATININE 1.30* 1.23*  CALCIUM 9.1 9.4   Liver Function Tests: Recent Labs  Lab 04/03/21 0234  AST 18  ALT 11  ALKPHOS 77  BILITOT 0.5  PROT 7.5  ALBUMIN 3.2*   No results for input(s): LIPASE, AMYLASE in the last 168 hours. No results for input(s): AMMONIA in the last 168 hours. CBC: Recent Labs  Lab 04/03/21 0234  WBC 7.7  HGB 10.2*  HCT 33.8*  MCV 94.2  PLT 289   Cardiac Enzymes: No results for input(s): CKTOTAL, CKMB, CKMBINDEX, TROPONINI in the last 168 hours. BNP: BNP (last 3 results) No results for input(s): BNP in the last 8760 hours.  ProBNP (last 3 results) No results for input(s): PROBNP in the last 8760 hours.  CBG: Recent Labs  Lab 04/03/21 1233 04/04/21 0453 04/04/21 0653  GLUCAP 76 64* 122*       Signed:  Rashel Okeefe  [facility] 04/04/2021, 2:19 PM

## 2021-04-04 NOTE — ED Notes (Signed)
Patient ate 80% of lunch and drank 253ml po fluids. No change in mentation or condition at this time.

## 2021-04-04 NOTE — Evaluation (Signed)
Physical Therapy Evaluation Patient Details Name: Diane Ross MRN: [id number] DOB: 08/23/1931 Today's Date: 04/04/2021   History of Present Illness  Pt is an 87 y/o female admitted 6/20 after syncopal episodes; per notes likely vasovagal in nature. CT of head negative and EEG WNL. PMH includes dementia, CKD, dCHF, HTN, and breast cancer.  Clinical Impression  Pt admitted secondary to problem above with deficits below. Pt with hx of dementia and likely close to baseline, however, no family present to confirm. Requiring min A for bed mobility, min guard A for transfers and min to min guard A for gait this session. Pt's balance improved with increased distance. Feel pt would benefit from use of RW at home and feel she will require 24/7 supervision. If family cannot provide, may need to consider SNF level therapies. Will continue to follow acutely to maximize functional mobility independence and safety.     Follow Up Recommendations Home health PT;Supervision/Assistance - 24 hour    Equipment Recommendations  Rolling walker with 5" wheels;3in1 (PT)    Recommendations for Other Services OT consult     Precautions / Restrictions Precautions Precautions: Fall Restrictions Weight Bearing Restrictions: No      Mobility  Bed Mobility Overal bed mobility: Needs Assistance Bed Mobility: Supine to Sit;Sit to Supine     Supine to sit: Min assist Sit to supine: Min assist   General bed mobility comments: Min A for trunk and LEs throughout.    Transfers Overall transfer level: Needs assistance Equipment used: 1 person hand held assist Transfers: Sit to/from Stand Sit to Stand: Min guard         General transfer comment: Min guard for safety. Pt reporting her legs felt a little weak in standing.  Ambulation/Gait Ambulation/Gait assistance: Min assist;Min guard Gait Distance (Feet): 100 Feet Assistive device: 1 person hand held assist Gait Pattern/deviations: Step-through  pattern;Decreased stride length;Narrow base of support Gait velocity: Decreased   General Gait Details: Pt initially unsteady requiring min A and HHA for support, but balance improved with increased distanc, requiring min guard A and HHA for safety. Pt with LOB initially requiring min A for support.  Stairs            Wheelchair Mobility    Modified Rankin (Stroke Patients Only)       Balance Overall balance assessment: Needs assistance Sitting-balance support: No upper extremity supported;Feet supported Sitting balance-Leahy Scale: Fair     Standing balance support: Single extremity supported;During functional activity Standing balance-Leahy Scale: Poor Standing balance comment: reliant on at least 1 UE support                             Pertinent Vitals/Pain Pain Assessment: No/denies pain    Home Living Family/patient expects to be discharged to:: Private residence Living Arrangements: Children Available Help at Discharge: Family;Available PRN/intermittently Type of Home: House Home Access: Level entry     Home Layout: One level Home Equipment: None Additional Comments: Pt reports she is living with her daughter.    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; unsure of accuracy given hx of dementia     Hand Dominance        Extremity/Trunk Assessment   Upper Extremity Assessment Upper Extremity Assessment: Defer to OT evaluation    Lower Extremity Assessment Lower Extremity Assessment: Generalized weakness  Cervical / Trunk Assessment Cervical / Trunk Assessment: Kyphotic  Communication   Communication: HOH  Cognition Arousal/Alertness: Awake/alert Behavior During Therapy: WFL for tasks assessed/performed Overall Cognitive Status: No family/caregiver present to determine baseline cognitive functioning                                  General Comments: Hx of dementia at baseline. Noted slow processing and pt reporting some conflicting information during session.      General Comments General comments (skin integrity, edema, etc.): No family present. VSS throughout    Exercises     Assessment/Plan    PT Assessment Patient needs continued PT services  PT Problem List Decreased strength;Decreased balance;Decreased activity tolerance;Decreased mobility;Decreased knowledge of use of DME;Decreased cognition;Decreased safety awareness;Decreased knowledge of precautions       PT Treatment Interventions DME instruction;Gait training;Stair training;Functional mobility training;Therapeutic activities;Therapeutic exercise;Balance training;Patient/family education    PT Goals (Current goals can be found in the Care Plan section)  Acute Rehab PT Goals Patient Stated Goal: to go home PT Goal Formulation: With patient Time For Goal Achievement: 04/18/21 Potential to Achieve Goals: Fair    Frequency Min 3X/week   Barriers to discharge        Co-evaluation               AM-PAC PT "6 Clicks" Mobility  Outcome Measure Help needed turning from your back to your side while in a flat bed without using bedrails?: A Little Help needed moving from lying on your back to sitting on the side of a flat bed without using bedrails?: A Little Help needed moving to and from a bed to a chair (including a wheelchair)?: A Little Help needed standing up from a chair using your arms (e.g., wheelchair or bedside chair)?: A Little Help needed to walk in hospital room?: A Little Help needed climbing 3-5 steps with a railing? : A Lot 6 Click Score: 17    End of Session Equipment Utilized During Treatment: Gait belt Activity Tolerance: Patient tolerated treatment well Patient left: in bed;with call bell/phone within reach (on stretcher in ED) Nurse Communication: Mobility status PT Visit Diagnosis: Unsteadiness on feet (R26.81);Muscle  weakness (generalized) (M62.81)    Time: 1050-1108 PT Time Calculation (min) (ACUTE ONLY): 18 min   Charges:   PT Evaluation $PT Eval Low Complexity: 1 Low          Lou Miner, DPT  Acute Rehabilitation Services  Pager: [phone] Office: [phone]   Rudean Hitt 04/04/2021, 12:27 PM

## 2021-04-04 NOTE — ED Notes (Signed)
Provided pt with snack and water.

## 2021-04-04 NOTE — Progress Notes (Signed)
EEG complete - results pending 

## 2021-04-04 NOTE — ED Notes (Signed)
Breakfast order placed ?

## 2021-04-04 NOTE — ED Notes (Signed)
Peri-care check. Patient is dry at this time. This RN spoke with Diane Ross, patient's daughter' to notify her that patient is up for discharge. Daughter states she will be here after her shift ends but before 5pm.

## 2021-04-04 NOTE — ED Notes (Signed)
Pt given OJ and snacks due to CBG being low. This RN assisted pt to restroom and back

## 2021-04-04 NOTE — ED Notes (Signed)
EEG complete

## 2021-04-04 NOTE — ED Notes (Signed)
EEG tech at bedside. 

## 2021-04-13 DIAGNOSIS — R55 Syncope and collapse: Secondary | ICD-10-CM | POA: Diagnosis not present

## 2021-04-13 DIAGNOSIS — E0590 Thyrotoxicosis, unspecified without thyrotoxic crisis or storm: Secondary | ICD-10-CM | POA: Diagnosis not present

## 2021-04-13 DIAGNOSIS — I10 Essential (primary) hypertension: Secondary | ICD-10-CM | POA: Diagnosis not present

## 2021-04-13 DIAGNOSIS — I498 Other specified cardiac arrhythmias: Secondary | ICD-10-CM | POA: Diagnosis not present

## 2021-04-13 DIAGNOSIS — I480 Paroxysmal atrial fibrillation: Secondary | ICD-10-CM | POA: Diagnosis not present

## 2021-05-12 DIAGNOSIS — Z79899 Other long term (current) drug therapy: Secondary | ICD-10-CM | POA: Diagnosis not present

## 2021-05-12 DIAGNOSIS — E871 Hypo-osmolality and hyponatremia: Secondary | ICD-10-CM | POA: Diagnosis not present

## 2021-05-12 DIAGNOSIS — R197 Diarrhea, unspecified: Secondary | ICD-10-CM | POA: Diagnosis not present

## 2021-05-12 DIAGNOSIS — I130 Hypertensive heart and chronic kidney disease with heart failure and stage 1 through stage 4 chronic kidney disease, or unspecified chronic kidney disease: Secondary | ICD-10-CM | POA: Diagnosis not present

## 2021-05-12 DIAGNOSIS — Z8616 Personal history of COVID-19: Secondary | ICD-10-CM | POA: Diagnosis not present

## 2021-05-12 DIAGNOSIS — Z8719 Personal history of other diseases of the digestive system: Secondary | ICD-10-CM | POA: Diagnosis not present

## 2021-05-12 DIAGNOSIS — N1832 Chronic kidney disease, stage 3b: Secondary | ICD-10-CM | POA: Diagnosis not present

## 2021-05-12 DIAGNOSIS — R55 Syncope and collapse: Secondary | ICD-10-CM | POA: Diagnosis not present

## 2021-05-12 DIAGNOSIS — E0590 Thyrotoxicosis, unspecified without thyrotoxic crisis or storm: Secondary | ICD-10-CM | POA: Diagnosis not present

## 2021-05-12 DIAGNOSIS — R2689 Other abnormalities of gait and mobility: Secondary | ICD-10-CM | POA: Diagnosis not present

## 2021-05-12 DIAGNOSIS — Z87891 Personal history of nicotine dependence: Secondary | ICD-10-CM | POA: Diagnosis not present

## 2021-05-12 DIAGNOSIS — E7800 Pure hypercholesterolemia, unspecified: Secondary | ICD-10-CM | POA: Diagnosis not present

## 2021-05-12 DIAGNOSIS — R569 Unspecified convulsions: Secondary | ICD-10-CM | POA: Diagnosis not present

## 2021-05-12 DIAGNOSIS — Z853 Personal history of malignant neoplasm of breast: Secondary | ICD-10-CM | POA: Diagnosis not present

## 2021-05-13 DIAGNOSIS — R930 Abnormal findings on diagnostic imaging of skull and head, not elsewhere classified: Secondary | ICD-10-CM | POA: Diagnosis not present

## 2021-05-13 DIAGNOSIS — E7800 Pure hypercholesterolemia, unspecified: Secondary | ICD-10-CM | POA: Diagnosis not present

## 2021-05-13 DIAGNOSIS — I5042 Chronic combined systolic (congestive) and diastolic (congestive) heart failure: Secondary | ICD-10-CM | POA: Diagnosis not present

## 2021-05-13 DIAGNOSIS — N1831 Chronic kidney disease, stage 3a: Secondary | ICD-10-CM | POA: Diagnosis not present

## 2021-05-13 DIAGNOSIS — R55 Syncope and collapse: Secondary | ICD-10-CM | POA: Diagnosis not present

## 2021-05-13 DIAGNOSIS — I10 Essential (primary) hypertension: Secondary | ICD-10-CM | POA: Diagnosis not present

## 2021-05-14 DIAGNOSIS — R569 Unspecified convulsions: Secondary | ICD-10-CM | POA: Diagnosis not present

## 2021-05-14 DIAGNOSIS — R55 Syncope and collapse: Secondary | ICD-10-CM | POA: Diagnosis not present

## 2021-05-15 DIAGNOSIS — R569 Unspecified convulsions: Secondary | ICD-10-CM | POA: Diagnosis not present

## 2021-05-15 DIAGNOSIS — I5032 Chronic diastolic (congestive) heart failure: Secondary | ICD-10-CM | POA: Diagnosis not present

## 2021-05-15 DIAGNOSIS — R55 Syncope and collapse: Secondary | ICD-10-CM | POA: Diagnosis not present

## 2021-05-15 DIAGNOSIS — R531 Weakness: Secondary | ICD-10-CM | POA: Diagnosis not present

## 2021-05-24 ENCOUNTER — Encounter: Payer: Self-pay | Admitting: Physician Assistant

## 2021-05-24 ENCOUNTER — Ambulatory Visit (INDEPENDENT_AMBULATORY_CARE_PROVIDER_SITE_OTHER): Payer: Medicare Other | Admitting: Physician Assistant

## 2021-05-24 ENCOUNTER — Other Ambulatory Visit: Payer: Self-pay

## 2021-05-24 VITALS — BP 128/66 | HR 54 | Resp 18 | Ht 65.0 in | Wt 140.2 lb

## 2021-05-24 DIAGNOSIS — R55 Syncope and collapse: Secondary | ICD-10-CM

## 2021-05-24 DIAGNOSIS — I480 Paroxysmal atrial fibrillation: Secondary | ICD-10-CM | POA: Diagnosis not present

## 2021-05-24 DIAGNOSIS — F0391 Unspecified dementia with behavioral disturbance: Secondary | ICD-10-CM

## 2021-05-24 DIAGNOSIS — I10 Essential (primary) hypertension: Secondary | ICD-10-CM | POA: Diagnosis not present

## 2021-05-24 DIAGNOSIS — Z8673 Personal history of transient ischemic attack (TIA), and cerebral infarction without residual deficits: Secondary | ICD-10-CM | POA: Diagnosis not present

## 2021-05-24 DIAGNOSIS — I498 Other specified cardiac arrhythmias: Secondary | ICD-10-CM | POA: Diagnosis not present

## 2021-05-24 DIAGNOSIS — I5022 Chronic systolic (congestive) heart failure: Secondary | ICD-10-CM | POA: Diagnosis not present

## 2021-05-24 NOTE — Progress Notes (Signed)
Seen Cardiology Office Note:    Date:  05/26/2021   ID:  Diane Ross, DOB 12-13-1930, MRN [id number]  PCP:  Leeroy Cha, MD   [place] [facility] Cardiologist:  Sanda Klein, MD     Referring MD: No ref. provider found   Chief Complaint  Patient presents with   Follow-up    Seen for Dr. [name], recurrent syncope    History of Present Illness:    Diane Ross is a 87 y.o. female with a hx of hypertension, heart failure with mildly decreased systolic function and dementia.  She has a history of left breast cancer treated with lumpectomy.  Thyroid nodule fine-needle aspiration obtained in August 2019 showed atypical findings of uncertain significance.  She was hospitalized with bleeding duodenal ulcer in June 2019 and was treated for H. pylori infection.  She has not had any major bleeding since.  Myoview obtained on 11/24/2017 showed EF 45%, no perfusion defect identified, intermediate risk study due to reduced ejection fraction.  Echocardiogram obtained on 01/12/2018 showed EF 50 to 55%, grade 1 DD, moderate to severe calcified mitral valve with mild MR, moderate LAE.  Patient was seen by Dr. [name] in August 2021 at which time she was doing well.  Blood pressure at the time was 125/61.  Based on phone conversation, it appears the patient's daughter arrived home on 12/11/2020 finding the patient slumped over, diaphoretic with blood pressure in the left arm 88/48.  Right arm blood pressure was 102/70.  Patient was slow to respond however was encouraged p.o. intake and stimulation, she returned to her normal self.  Repeat blood pressure in the next day continue to show low blood pressure in the left arm and abnormal blood pressure in the right arm.  I last saw the patient on 12/29/2020, she had completely recovered back to her normal self.  Daughter initially held losartan however reintroduce losartan 25 mg daily instead of the previous 50 mg twice a day.  Since her blood  pressure was stable in the 150s, I did increase her losartan to 25 mg twice a day dosing.  I recommended 2-week heart monitor and the echocardiogram for follow-up.  Echocardiogram shows normal EF, heart monitor shows no significant arrhythmia.  Since the last visit, she went to the hospital 3 different times for syncope.  CT of the head revealed no acute changes.  COVID test negative.  CBC showed mild anemia but no no other acute finding.  Basic metabolic panel shows creatinine 1.3.  Per family member, patient appears to be sweaty and pale.  Since then, she has went to the ED and [facility] on 05/12/2021 for recurrent syncope, EEG showed no propensity for seizure.  MRI of the brain shows no acute finding.  Talking with the daughter today, by her PCP, losartan was discontinued and so she was switched to hydralazine 10 mg twice a day dosing on top of carvedilol.  Her syncopal episode typically occur when she is sitting down.  Last episode happened while she was eating.  I will discussed with Dr. [name], she did not have any presyncope or syncope while wearing the heart monitor, I question if she needed a longer heart monitor versus a loop recorder given the multiple recurrence of syncope.    Past Medical History:  Diagnosis Date   Breast cancer ([place])    left side   Hypertension    Thyroid disorder     Past Surgical History:  Procedure Laterality Date   ABDOMINAL HYSTERECTOMY  BREAST LUMPECTOMY Left    ESOPHAGOGASTRODUODENOSCOPY N/A 03/29/2018   Procedure: ESOPHAGOGASTRODUODENOSCOPY (EGD);  Surgeon: Carol Ada, MD;  Location: [facility];  Service: Endoscopy;  Laterality: N/A;    Current Medications: Current Meds  Medication Sig   atorvastatin (LIPITOR) 20 MG tablet Take 20 mg by mouth daily.   carboxymethylcellulose (REFRESH PLUS) 0.5 % SOLN Place 2 drops into both eyes 3 (three) times daily as needed (for dry eyes).   carvedilol (COREG) 6.25 MG tablet Take 1 tablet (6.25 mg  total) by mouth 2 (two) times daily with a meal.   divalproex (DEPAKOTE) 500 MG DR tablet Take 250 mg by mouth in the morning and at bedtime.   donepezil (ARICEPT) 10 MG tablet Take 10 mg by mouth daily.   furosemide (LASIX) 40 MG tablet Take one tablet, 40 mg, three days a week: Monday, Wednesday and Friday   hydrALAZINE (APRESOLINE) 10 MG tablet Take 10 mg by mouth.   memantine (NAMENDA) 5 MG tablet Take 5 mg by mouth daily.   mirtazapine (REMERON) 7.5 MG tablet Take 7.5 mg by mouth at bedtime.   Multiple Vitamin (MULTIVITAMIN) tablet Take 1 tablet by mouth daily.   potassium chloride SA (K-DUR) 20 MEQ tablet Take one tablet on Monday, Wednesday and Friday     Allergies:   Patient has no known allergies.   Social History   Socioeconomic History   Marital status: Widowed    Spouse name: Not on file   Number of children: Not on file   Years of education: Not on file   Highest education level: Not on file  Occupational History   Occupation: retired  Tobacco Use   Smoking status: Former    Years: 55.00    Types: Cigarettes   Smokeless tobacco: Never  Vaping Use   Vaping Use: Never used  Substance and Sexual Activity   Alcohol use: No   Drug use: No   Sexual activity: Never  Other Topics Concern   Not on file  Social History Narrative   Not on file   Social Determinants of Health   Financial Resource Strain: Not on file  Food Insecurity: Not on file  Transportation Needs: Not on file  Physical Activity: Not on file  Stress: Not on file  Social Connections: Not on file     Family History: The patient's family history is not on file.  ROS:   Please see the history of present illness.     All other systems reviewed and are negative.  EKGs/Labs/Other Studies Reviewed:    The following studies were reviewed today:  Echo 01/29/2021  1. Left ventricular ejection fraction, by estimation, is 55%. The left  ventricle has normal function. The left ventricle has no  regional wall  motion abnormalities. Left ventricular diastolic parameters are consistent  with Grade I diastolic dysfunction  (impaired relaxation).   2. Right ventricular systolic function is normal. The right ventricular  size is normal. There is normal pulmonary artery systolic pressure. The  estimated right ventricular systolic pressure is [id number] mmHg.   3. Left atrial size was severely dilated.   4. Right atrial size was mildly dilated.   5. The mitral valve is normal in structure. Trivial mitral valve  regurgitation. No evidence of mitral stenosis. Moderate mitral annular  calcification.   6. The aortic valve is tricuspid. Aortic valve regurgitation is trivial.  Mild aortic valve sclerosis is present, with no evidence of aortic valve  stenosis.   7. The inferior vena  cava is normal in size with greater than 50%  respiratory variability, suggesting right atrial pressure of 3 mmHg.   EKG:  EKG is not ordered today.   Recent Labs: 12/29/2020: TSH 0.701 04/03/2021: ALT 11; Hemoglobin 10.2; Platelets 289 04/04/2021: BUN 27; Creatinine, Ser 1.23; Potassium 4.3; Sodium 134  Recent Lipid Panel No results found for: CHOL, TRIG, HDL, CHOLHDL, VLDL, LDLCALC, LDLDIRECT   Risk Assessment/Calculations:           Physical Exam:    VS:  BP 128/66   Pulse (!) 54   Resp 18   Ht '5\' 5"'$  (1.651 m)   Wt 140 lb 3.2 oz (63.6 kg)   BMI 23.33 kg/m     Wt Readings from Last 3 Encounters:  05/24/21 140 lb 3.2 oz (63.6 kg)  04/03/21 135 lb (61.2 kg)  03/14/21 138 lb 7.2 oz (62.8 kg)     GEN:  Well nourished, well developed in no acute distress HEENT: Normal NECK: No JVD; No carotid bruits LYMPHATICS: No lymphadenopathy CARDIAC: RRR, no murmurs, rubs, gallops RESPIRATORY:  Clear to auscultation without rales, wheezing or rhonchi  ABDOMEN: Soft, non-tender, non-distended MUSCULOSKELETAL:  No edema; No deformity  SKIN: Warm and dry NEUROLOGIC:  Alert and oriented x 3 PSYCHIATRIC:  Normal  affect   ASSESSMENT:    1. Syncope and collapse   2. Primary hypertension   3. Dementia with behavioral disturbance, unspecified dementia type ([place])    PLAN:    In order of problems listed above:  Recurrent syncope: Recent echocardiogram and the heart monitor were reassuring.  Although patient had bradycardia on the heart monitor, this only occurred at night during sleep.  However since the last visit, she continued to have recurrent syncope.  Recent work-up in [place] revealed normal brain MRI and a normal EEG without evidence of seizure.  I discussed the case with Dr. [name], I questioned if the patient needed longer monitor versus a loop recorder.  Discussing with the patient's daughter, family has already made the decision that the patient is not a candidate for any surgery including a pacemaker implantation.  Therefore a loop recorder should not be implanted as patient is not a candidate for pacemaker anyway.  We eventually decided to wean her off of beta-blocker and see how she does.  She will cut the carvedilol by half for the next 3 days then starting next Monday, she will completely come off of the remaining beta-blocker.  Hopefully this will prevent any recurrent syncope.  Hypertension: Her blood pressure is normal today, however after discussing with Dr. [name], we are planning to bring her off of beta-blocker in hopes to prevent another hospitalization from syncope.  Family does not believe the patient would wish to have a pacemaker implantation  Dementia: She has moderate degree of dementia, majority of the interview was conducted with the help of her daughter.        Medication Adjustments/Labs and Tests Ordered: Current medicines are reviewed at length with the patient today.  Concerns regarding medicines are outlined above.  No orders of the defined types were placed in this encounter.  No orders of the defined types were placed in this encounter.   Patient  Instructions  Medication Instructions:  The current medical regimen is effective;  continue present plan and medications as directed. Please refer to the Current Medication list given to you today.   *If you need a refill on your cardiac medications before your next appointment, please call your pharmacy*  Follow-Up: Your next appointment:  3 month(s) In Person with Sanda Klein, MD ONLY  At [facility], you and your health needs are our priority.  As part of our continuing mission to provide you with exceptional heart care, we have created designated Provider Care Teams.  These Care Teams include your primary Cardiologist (physician) and Advanced Practice Providers (APPs -  Physician Assistants and Nurse Practitioners) who all work together to provide you with the care you need, when you need it.      Hilbert Corrigan, [place]  05/26/2021 11:34 PM    [facility]

## 2021-05-24 NOTE — Patient Instructions (Signed)
Medication Instructions:  The current medical regimen is effective;  continue present plan and medications as directed. Please refer to the Current Medication list given to you today.   *If you need a refill on your cardiac medications before your next appointment, please call your pharmacy*  Follow-Up: Your next appointment:  3 month(s) In Person with Sanda Klein, MD ONLY  At [facility], you and your health needs are our priority.  As part of our continuing mission to provide you with exceptional heart care, we have created designated Provider Care Teams.  These Care Teams include your primary Cardiologist (physician) and Advanced Practice Providers (APPs -  Physician Assistants and Nurse Practitioners) who all work together to provide you with the care you need, when you need it.

## 2021-05-26 ENCOUNTER — Encounter: Payer: Self-pay | Admitting: Physician Assistant

## 2021-06-22 DIAGNOSIS — H903 Sensorineural hearing loss, bilateral: Secondary | ICD-10-CM | POA: Diagnosis not present

## 2021-08-03 DIAGNOSIS — Z1231 Encounter for screening mammogram for malignant neoplasm of breast: Secondary | ICD-10-CM | POA: Diagnosis not present

## 2021-08-31 DIAGNOSIS — E0590 Thyrotoxicosis, unspecified without thyrotoxic crisis or storm: Secondary | ICD-10-CM | POA: Diagnosis not present

## 2021-08-31 DIAGNOSIS — E042 Nontoxic multinodular goiter: Secondary | ICD-10-CM | POA: Diagnosis not present

## 2021-09-04 ENCOUNTER — Ambulatory Visit: Payer: Medicare Other | Admitting: Cardiovascular Disease

## 2021-09-17 DIAGNOSIS — E7800 Pure hypercholesterolemia, unspecified: Secondary | ICD-10-CM | POA: Diagnosis not present

## 2021-09-17 DIAGNOSIS — I5022 Chronic systolic (congestive) heart failure: Secondary | ICD-10-CM | POA: Diagnosis not present

## 2021-09-17 DIAGNOSIS — I480 Paroxysmal atrial fibrillation: Secondary | ICD-10-CM | POA: Diagnosis not present

## 2021-09-17 DIAGNOSIS — I10 Essential (primary) hypertension: Secondary | ICD-10-CM | POA: Diagnosis not present

## 2021-10-31 NOTE — Progress Notes (Deleted)
Cardiology Office Note:    Date:  10/31/2021   ID:  Diane Ross, DOB Aug 19, 1931, MRN [id number]  PCP:  Leeroy Cha, MD  Cardiologist:  Sanda Klein, MD  Electrophysiologist:  None   Referring MD: Leeroy Cha,*   Chief Complaint: follow-up of recurrent syncope  History of Present Illness:    Diane Ross is a 88 y.o. female with a history of chronic CHF with mildly EF of 45% on Myoview in 2019 but improved to 55% on Echo in 01/2021, recurrent syncope (work-up unremarkable), hypertension, thyroid nodule, left breast cancer s/p lumpectomy, and dementia who is followed by Dr. [name] and presents today for follow-up of recurrent syncope.  Patient was referred to Dr. [name] in 11/2017 for further evaluation of chest pain. Myoview was ordered and showed no evidence of ischemia although there was mild diffuse hypokinesis with an EF of 45%.  Echo in 01/2018 showed LVEF of 50-55% with mild LVH,  grade 1 diastolic dysfunction, and mild MR.  Over the last year, she has had multiple episodes of syncope requiring multiple hospitalizations.  Work-up including EEG for seizures regularly have been unremarkable. Echo in 01/2021 showed LVEF of 55% with normal wall motion, grade 1 diastolic dysfunction, and no significant valvular disease.  Event monitor in 01/2021 showed frequent PACs (12.3% burden disease and frequent episodes of nonsustained atrial tachycardia (longest run lasting 13 beats) seen and exclusively during daytime hours but no atrial fibrillation.  There were occasional brief episodes of bradycardia due to second-degree AV block type I (Wenckebach) while patient was sleeping.  However, no significant arrhythmias to explain recurrent syncope.   Patient was last seen by Almyra Deforest, PA-C, in 05/2021 at that time she had reported multiple episodes of syncope since her last visit with 3 different hospitalizations for this which led to the above neurologic work-up (EEG and brain MRI).   Syncopal episodes occurred sitting down. She had no syncopal episodes while wearing the event monitor.  Her PCP stopped Losartan and put her on Hydralazine twice a day instead. Isaac Laud spoke with Dr. [name] who stated no need for loop recorder given she would not be a candidate for PPM implantation.  Decision was made to wean her off her beta-blocker completely to see if that would prevent any recurrent syncope.  Patient presents today for follow-up. ***  Recurrent Syncope Patient has a history of recurrent syncope. Work-up including Echo, cardiac monitor, EEG, and brain MRI have been unremarkable. Echo showed normal LV function with no significant valvular disease. Cardiac monitor showed frequent PACs and frequent short runs of non-sustained atrial tachycardia but nothing that would cause syncope. No plans for loop recorder given she is not a candidate for PPM. Plan was to wean her off beta-blocker to see if that helps.  - ***  Chronic CHF with Mildly Reduced EF LVEF 45% on Myoview in 2019 but has since improved. Last Echo in 01/2021 showed  LVEF of 55% with normal wall motion, grade 1 diastolic dysfunction, and no significant valvular disease. - Euvolemic on exam. - Continue Lasix 40mg  daily M/W/F. - No longer on Losartan or Coreg due to recurrent syncope. - Continue daily weights and sodium/fluid restrictions.  Hypertension BP well controlled. - Continue Hydralazine 10mg  twice daily. ***  Dyslipidemia Lipid panel in *** - Continue Lipitor 20mg  daily.  Past Medical History:  Diagnosis Date   Breast cancer ([place])    left side   Hypertension    Thyroid disorder     Past Surgical History:  Procedure Laterality Date   ABDOMINAL HYSTERECTOMY     BREAST LUMPECTOMY Left    ESOPHAGOGASTRODUODENOSCOPY N/A 03/29/2018   Procedure: ESOPHAGOGASTRODUODENOSCOPY (EGD);  Surgeon: Carol Ada, MD;  Location: [facility];  Service: Endoscopy;  Laterality: N/A;    Current Medications: No  outpatient medications have been marked as taking for the 11/12/21 encounter (Appointment) with Darreld Mclean, PA-C.     Allergies:   Patient has no known allergies.   Social History   Socioeconomic History   Marital status: Widowed    Spouse name: Not on file   Number of children: Not on file   Years of education: Not on file   Highest education level: Not on file  Occupational History   Occupation: retired  Tobacco Use   Smoking status: Former    Years: 55.00    Types: Cigarettes   Smokeless tobacco: Never  Vaping Use   Vaping Use: Never used  Substance and Sexual Activity   Alcohol use: No   Drug use: No   Sexual activity: Never  Other Topics Concern   Not on file  Social History Narrative   Not on file   Social Determinants of Health   Financial Resource Strain: Not on file  Food Insecurity: Not on file  Transportation Needs: Not on file  Physical Activity: Not on file  Stress: Not on file  Social Connections: Not on file     Family History: The patient's family history is not on file.  ROS:   Please see the history of present illness.     EKGs/Labs/Other Studies Reviewed:    The following studies were reviewed today:  Myoview 11/24/2017: Nuclear stress EF: 45%. Mild diffuse hypokinesis There was no ST segment deviation noted during stress. The study is normal. No perfusion defects identified. Dilated LV. This is an intermediate risk study based upon mildly reduced left ventricular ejection fraction. Non-ischemic cardiomyopathy. _______________  Echocardiogram 01/29/2021: Impressions:  1. Left ventricular ejection fraction, by estimation, is 55%. The left  ventricle has normal function. The left ventricle has no regional wall  motion abnormalities. Left ventricular diastolic parameters are consistent  with Grade I diastolic dysfunction  (impaired relaxation).   2. Right ventricular systolic function is normal. The right ventricular  size is  normal. There is normal pulmonary artery systolic pressure. The  estimated right ventricular systolic pressure is 96.2 mmHg.   3. Left atrial size was severely dilated.   4. Right atrial size was mildly dilated.   5. The mitral valve is normal in structure. Trivial mitral valve  regurgitation. No evidence of mitral stenosis. Moderate mitral annular  calcification.   6. The aortic valve is tricuspid. Aortic valve regurgitation is trivial.  Mild aortic valve sclerosis is present, with no evidence of aortic valve  stenosis.   7. The inferior vena cava is normal in size with greater than 50%  respiratory variability, suggesting right atrial pressure of 3 mmHg.  _______________  Monitor 01/2021: Predominant rhythm is normal sinus rhythm with normal circadian variation. There are occasional episodes of second-degree atrioventricular block, Mobitz type I, associated with transient bradycardia. The minimum heart rate is 22 bpm. There are frequent PACs (12.3%), frequent atrial couplets (2.6%) and frequent episodes of nonsustained atrial tachycardia (4-13 beats, maximum 167 bpm), but there is no evidence of true atrial fibrillation. The episodes of atrial tachyarrhythmia are seen exclusively during daytime hours and are completely abolished during sleep. Rare PVCs are present and there is no significant complex ventricular arrhythmia.  Abnormal event monitor to the presence of  Very frequent premature atrial complexes and frequent episodes of nonsustained atrial tachycardia, occurring during daytime hours and absent at night.  There is no evidence of atrial fibrillation.  During sleep, there are occasional brief episodes of bradycardia due to second-degree atrioventricular block, Mobitz type I.  EKG:  EKG not ordered today.   Recent Labs: 12/29/2020: TSH 0.701 04/03/2021: ALT 11; Hemoglobin 10.2; Platelets 289 04/04/2021: BUN 27; Creatinine, Ser 1.23; Potassium 4.3; Sodium 134  Recent Lipid Panel No  results found for: CHOL, TRIG, HDL, CHOLHDL, VLDL, LDLCALC, LDLDIRECT  Physical Exam:    Vital Signs: There were no vitals taken for this visit.    Wt Readings from Last 3 Encounters:  05/24/21 140 lb 3.2 oz (63.6 kg)  04/03/21 135 lb (61.2 kg)  03/14/21 138 lb 7.2 oz (62.8 kg)     General: 88 y.o. female in no acute distress. HEENT: Normocephalic and atraumatic. Sclera clear. EOMs intact. Neck: Supple. No carotid bruits. No JVD. Heart: *** RRR. Distinct S1 and S2. No murmurs, gallops, or rubs. Radial and distal pedal pulses 2+ and equal bilaterally. Lungs: No increased work of breathing. Clear to ausculation bilaterally. No wheezes, rhonchi, or rales.  Abdomen: Soft, non-distended, and non-tender to palpation. Bowel sounds present in all 4 quadrants.  MSK: Normal strength and tone for age. *** Extremities: No lower extremity edema.    Skin: Warm and dry. Neuro: Alert and oriented x3. No focal deficits. Psych: Normal affect. Responds appropriately.   Assessment:    No diagnosis found.  Plan:     Disposition: Follow up in ***   Medication Adjustments/Labs and Tests Ordered: Current medicines are reviewed at length with the patient today.  Concerns regarding medicines are outlined above.  No orders of the defined types were placed in this encounter.  No orders of the defined types were placed in this encounter.   There are no Patient Instructions on file for this visit.   Signed, Darreld Mclean, PA-C  10/31/2021 12:57 PM    [facility]

## 2021-11-12 ENCOUNTER — Ambulatory Visit: Payer: Medicare Other | Admitting: Student

## 2021-11-12 DIAGNOSIS — I5022 Chronic systolic (congestive) heart failure: Secondary | ICD-10-CM | POA: Diagnosis not present

## 2021-11-12 DIAGNOSIS — I10 Essential (primary) hypertension: Secondary | ICD-10-CM | POA: Diagnosis not present

## 2021-11-12 DIAGNOSIS — E7800 Pure hypercholesterolemia, unspecified: Secondary | ICD-10-CM | POA: Diagnosis not present

## 2021-11-12 DIAGNOSIS — I480 Paroxysmal atrial fibrillation: Secondary | ICD-10-CM | POA: Diagnosis not present

## 2021-11-12 NOTE — Progress Notes (Deleted)
Office Visit    Patient Name: Diane Ross Date of Encounter: 11/12/2021  Primary Care Provider:  Leeroy Cha, MD Primary Cardiologist:  Sanda Klein, MD  Chief Complaint    88 year old female with a history of chronic systolic heart failure (improved EF), hypertension, recurrent syncope, CKD stage IIIb, thyroid nodule, L breast cancer s/p lumpectomy, GI bleed, and dementia who presents for follow-up related to heart failure.  Past Medical History    Past Medical History:  Diagnosis Date   Breast cancer ([place])    left side   Hypertension    Thyroid disorder    Past Surgical History:  Procedure Laterality Date   ABDOMINAL HYSTERECTOMY     BREAST LUMPECTOMY Left    ESOPHAGOGASTRODUODENOSCOPY N/A 03/29/2018   Procedure: ESOPHAGOGASTRODUODENOSCOPY (EGD);  Surgeon: Carol Ada, MD;  Location: [facility];  Service: Endoscopy;  Laterality: N/A;    Allergies  No Known Allergies  History of Present Illness    88 year old female with above past medical history including chronic systolic heart failure (improved EF), hypertension, recurrent syncope, CKD stage IIIb, thyroid nodule, L breast cancer s/p lumpectomy, GI bleed, and dementia.  Myoview in 2019 showed EF 45%, no perfusion defect identified, intermediate risk study due to reduced ejection fraction.  Echocardiogram in 2018 showed EF 50 to 55%, grade 1 DD, severely calcified mitral valve with mild MR, LAE. Cardiac event monitor in 2022 noting syncope showed very frequent PACs, frequent nonsustained atrial tachycardia, no A. fib, rare PVCs, bradycardia, second-degree AV block, Mobitz type I, no significant arrhythmia.  Repeat echocardiogram showed EF 55%, normal LV function, G1 DD, severe LAE, moderate mitral annular calcification, trivial MR, aortic valve sclerosis with no evidence of stenosis.  She was hospitalized in June 2022 for recurrent syncope.  Hospital work-up, including EEG and MRI, was unremarkable.  Syncope was suspected to be secondary to vasovagal response and possible orthostatic hypotension.   She was last seen in the office in August 2022 at which time she reported ongoing syncopal episodes.  The patient's daughter confirmed at the time that the family decided that the patient was not a candidate for surgical intervention, including pacemaker implantation. Therefore, the decision to not pursue loop recorder implantation for monitoring in setting of recurrent syncope was made, and her beta-blocker was weaned. She presents today for follow-up.  Since her last visit  Chronic systolic heart failure (improved EF): Recurrent syncope: Hypertension: CKD stage IIIb: Dementia: Disposition:  Home Medications    Current Outpatient Medications  Medication Sig Dispense Refill   atorvastatin (LIPITOR) 20 MG tablet Take 20 mg by mouth daily.     carboxymethylcellulose (REFRESH PLUS) 0.5 % SOLN Place 2 drops into both eyes 3 (three) times daily as needed (for dry eyes).     carvedilol (COREG) 6.25 MG tablet Take 1 tablet (6.25 mg total) by mouth 2 (two) times daily with a meal. 180 tablet 3   divalproex (DEPAKOTE) 500 MG DR tablet Take 250 mg by mouth in the morning and at bedtime.     donepezil (ARICEPT) 10 MG tablet Take 10 mg by mouth daily.     furosemide (LASIX) 40 MG tablet Take one tablet, 40 mg, three days a week: Monday, Wednesday and Friday 30 tablet    hydrALAZINE (APRESOLINE) 10 MG tablet Take 10 mg by mouth.     memantine (NAMENDA) 5 MG tablet Take 5 mg by mouth daily.     mirtazapine (REMERON) 7.5 MG tablet Take 7.5 mg by mouth at bedtime.  Multiple Vitamin (MULTIVITAMIN) tablet Take 1 tablet by mouth daily.     potassium chloride SA (K-DUR) 20 MEQ tablet Take one tablet on Monday, Wednesday and Friday     No current facility-administered medications for this visit.     Review of Systems    ***.  All other systems reviewed and are otherwise negative except as noted above.     Physical Exam    VS:  There were no vitals taken for this visit. , BMI There is no height or weight on file to calculate BMI.     GEN: Well nourished, well developed, in no acute distress. HEENT: normal. Neck: Supple, no JVD, carotid bruits, or masses. Cardiac: RRR, no murmurs, rubs, or gallops. No clubbing, cyanosis, edema.  Radials/DP/PT 2+ and equal bilaterally.  Respiratory:  Respirations regular and unlabored, clear to auscultation bilaterally. GI: Soft, nontender, nondistended, BS + x 4. MS: no deformity or atrophy. Skin: warm and dry, no rash. Neuro:  Strength and sensation are intact. Psych: Normal affect.  Accessory Clinical Findings    ECG personally reviewed by me today - *** - no acute changes.  Lab Results  Component Value Date   WBC 7.7 04/03/2021   HGB 10.2 (L) 04/03/2021   HCT 33.8 (L) 04/03/2021   MCV 94.2 04/03/2021   PLT 289 04/03/2021   Lab Results  Component Value Date   CREATININE 1.23 (H) 04/04/2021   BUN 27 (H) 04/04/2021   NA 134 (L) 04/04/2021   K 4.3 04/04/2021   CL 96 (L) 04/04/2021   CO2 29 04/04/2021   Lab Results  Component Value Date   ALT 11 04/03/2021   AST 18 04/03/2021   ALKPHOS 77 04/03/2021   BILITOT 0.5 04/03/2021   No results found for: CHOL, HDL, LDLCALC, LDLDIRECT, TRIG, CHOLHDL  No results found for: HGBA1C  Assessment & Plan    1.  ***   Diane Sciara, NP 11/12/2021, 4:19 AM

## 2022-01-24 ENCOUNTER — Encounter: Payer: Self-pay | Admitting: Cardiovascular Disease

## 2022-01-24 ENCOUNTER — Ambulatory Visit: Payer: Medicare Other | Admitting: Cardiovascular Disease

## 2022-01-24 VITALS — BP 125/80 | HR 76 | Ht 66.0 in | Wt 136.2 lb

## 2022-01-24 DIAGNOSIS — D509 Iron deficiency anemia, unspecified: Secondary | ICD-10-CM

## 2022-01-24 DIAGNOSIS — I10 Essential (primary) hypertension: Secondary | ICD-10-CM

## 2022-01-24 DIAGNOSIS — E0590 Thyrotoxicosis, unspecified without thyrotoxic crisis or storm: Secondary | ICD-10-CM | POA: Diagnosis not present

## 2022-01-24 DIAGNOSIS — E785 Hyperlipidemia, unspecified: Secondary | ICD-10-CM

## 2022-01-24 DIAGNOSIS — I5042 Chronic combined systolic (congestive) and diastolic (congestive) heart failure: Secondary | ICD-10-CM | POA: Diagnosis not present

## 2022-01-24 LAB — COMPREHENSIVE METABOLIC PANEL
ALT: 10 IU/L (ref 0–32)
AST: 22 IU/L (ref 0–40)
Albumin/Globulin Ratio: 1.2 (ref 1.2–2.2)
Albumin: 3.8 g/dL (ref 3.6–4.6)
Alkaline Phosphatase: 70 IU/L (ref 44–121)
BUN/Creatinine Ratio: 22 (ref 12–28)
BUN: 26 mg/dL (ref 8–27)
Bilirubin Total: 0.2 mg/dL (ref 0.0–1.2)
CO2: 30 mmol/L — ABNORMAL HIGH (ref 20–29)
Calcium: 9.3 mg/dL (ref 8.7–10.3)
Chloride: 102 mmol/L (ref 96–106)
Creatinine, Ser: 1.16 mg/dL — ABNORMAL HIGH (ref 0.57–1.00)
Globulin, Total: 3.2 g/dL (ref 1.5–4.5)
Glucose: 85 mg/dL (ref 70–99)
Potassium: 4.4 mmol/L (ref 3.5–5.2)
Sodium: 145 mmol/L — ABNORMAL HIGH (ref 134–144)
Total Protein: 7 g/dL (ref 6.0–8.5)
eGFR: 45 mL/min/{1.73_m2} — ABNORMAL LOW (ref >59)

## 2022-01-24 LAB — IRON,TIBC AND FERRITIN PANEL
Ferritin: 165 ng/mL — ABNORMAL HIGH (ref 15–150)
Iron Saturation: 28 % (ref 15–55)
Iron: 67 ug/dL (ref 27–139)
Total Iron Binding Capacity: 238 ug/dL — ABNORMAL LOW (ref 250–450)
UIBC: 171 ug/dL (ref 118–369)

## 2022-01-24 LAB — CBC
Hematocrit: 34.7 % (ref 34.0–46.6)
Hemoglobin: 11.2 g/dL (ref 11.1–15.9)
MCH: 28.9 pg (ref 26.6–33.0)
MCHC: 32.3 g/dL (ref 31.5–35.7)
MCV: 89 fL (ref 79–97)
Platelets: 200 10*3/uL (ref 150–450)
RBC: 3.88 x10E6/uL (ref 3.77–5.28)
RDW: 12.8 % (ref 11.7–15.4)
WBC: 5.5 10*3/uL (ref 3.4–10.8)

## 2022-01-24 LAB — LIPID PANEL
Chol/HDL Ratio: 4 ratio (ref 0.0–4.4)
Cholesterol, Total: 137 mg/dL (ref 100–199)
HDL: 34 mg/dL — ABNORMAL LOW (ref >39)
LDL Chol Calc (NIH): 84 mg/dL (ref 0–99)
Triglycerides: 100 mg/dL (ref 0–149)
VLDL Cholesterol Cal: 19 mg/dL (ref 5–40)

## 2022-01-24 NOTE — Patient Instructions (Signed)
Medication Instructions:  ?The current medical regimen is effective;  continue present plan and medications. ? ?*If you need a refill on your cardiac medications before your next appointment, please call your pharmacy* ? ? ?Lab Work: ?CBC, CMET, LIPID, IRON STUDIES today  ? ?If you have labs (blood work) drawn today and your tests are completely normal, you will receive your results only by: ?MyChart Message (if you have MyChart) OR ?A paper copy in the mail ?If you have any lab test that is abnormal or we need to change your treatment, we will call you to review the results. ? ? ?Follow-Up: ?At [facility], you and your health needs are our priority.  As part of our continuing mission to provide you with exceptional heart care, we have created designated Provider Care Teams.  These Care Teams include your primary Cardiologist (physician) and Advanced Practice Providers (APPs -  Physician Assistants and Nurse Practitioners) who all work together to provide you with the care you need, when you need it. ? ?We recommend signing up for the patient portal called "MyChart".  Sign up information is provided on this After Visit Summary.  MyChart is used to connect with patients for Virtual Visits (Telemedicine).  Patients are able to view lab/test results, encounter notes, upcoming appointments, etc.  Non-urgent messages can be sent to your provider as well.   ?To learn more about what you can do with MyChart, go to [email or web address].   ? ?Your next appointment:   ?12 month(s) ? ?The format for your next appointment:   ?In Person ? ?Provider:   ?Sanda Klein, MD   ? ? ? ? ? ? ? ? ?

## 2022-01-24 NOTE — Progress Notes (Signed)
?Cardiology office note:   ? ?Date:  01/24/2022  ? ?ID:  Diane Ross, DOB [birth year], MRN [id number] ? ?PCP:  Leeroy Cha, MD  ?Cardiologist:  New ? ?Referring MD: [name], NP ? ?Chief Complaint  ?Patient presents with  ? Congestive Heart Failure  ?   ?  ? ? ? ?History of Present Illness:   ? ?Diane Ross is a 88 y.o. female with a hx of long-standing hypertension, heart failure with mildly decreased systolic function, returning in follow-up. ? ?She has done well since last summer when she had a syncopal event, possibly due to transient hypotension.Losartan has been stopped since that time.  She has not had any further syncopal events and denies dizziness or palpitations. ? ?She denies lower extremity edema, orthopnea, PND or shortness of breath walking around the house.  She moves rather slowly and uses a walker.  She does not have angina pectoris.  She denies palpitations. ? ?She only takes diuretics 3 days a week.  She has not had problems with hypotension. ? ?Most recent work-up showed LV ejection fraction of approximately 50%, prominent calcific changes of the mitral valve without significant mitral stenosis or regurgitation.  Previous nuclear perfusion imaging has not shown any evidence for coronary insufficiency. ? ?Previous neurological work-up after her syncopal events showed normal brain MRI and normal EEG without evidence of seizures. ? ?She was hospitalized in June 2019 with a bleeding duodenal ulcer and was treated for H. pylori infection.  She has not had any overt bleeding since.  Her most recent hemoglobin checked in June 2022 was still low at 10.2.  Thyroid nodule fine-needle aspiration showed atypical findings of uncertain significance on 05/20/2018.  TSH has not been repeated. ? ?She has not smoked since 1954.  She has a history of left breast cancer treated with lumpectomy.  She is not certain, but believes she may have received chemotherapy.  She does not recognize the words  Adriamycin or doxorubicin.  She has previously undergone hysterectomy. Last EF assessment was echo in April 2019, 50-55%, little improved compared to [facility] in February, EF 45% (No perfusion abnormalities were seen).   ? ?Past Medical History:  ?Diagnosis Date  ? Breast cancer ([place])   ? left side  ? Hypertension   ? Thyroid disorder   ? ? ?Past Surgical History:  ?Procedure Laterality Date  ? ABDOMINAL HYSTERECTOMY    ? BREAST LUMPECTOMY Left   ? ESOPHAGOGASTRODUODENOSCOPY N/A 03/29/2018  ? Procedure: ESOPHAGOGASTRODUODENOSCOPY (EGD);  Surgeon: Carol Ada, MD;  Location: [place];  Service: Endoscopy;  Laterality: N/A;  ? ? ?Current Medications: ?Current Meds  ?Medication Sig  ? atorvastatin (LIPITOR) 20 MG tablet Take 20 mg by mouth daily.  ? carboxymethylcellulose (REFRESH PLUS) 0.5 % SOLN Place 2 drops into both eyes 3 (three) times daily as needed (for dry eyes).  ? carvedilol (COREG) 6.25 MG tablet Take 1 tablet (6.25 mg total) by mouth 2 (two) times daily with a meal.  ? divalproex (DEPAKOTE) 250 MG DR tablet Take 250 mg by mouth 2 (two) times daily.  ? donepezil (ARICEPT) 10 MG tablet Take 10 mg by mouth daily.  ? furosemide (LASIX) 40 MG tablet Take one tablet, 40 mg, three days a week: Monday, Wednesday and Friday  ? hydrALAZINE (APRESOLINE) 10 MG tablet Take 10 mg by mouth.  ? memantine (NAMENDA) 5 MG tablet Take 5 mg by mouth daily.  ? mirtazapine (REMERON) 7.5 MG tablet Take 7.5 mg by mouth at bedtime.  ?  Multiple Vitamin (MULTIVITAMIN) tablet Take 1 tablet by mouth daily.  ? potassium chloride SA (K-DUR) 20 MEQ tablet Take one tablet on Monday, Wednesday and Friday  ? [DISCONTINUED] divalproex (DEPAKOTE) 500 MG DR tablet Take 250 mg by mouth in the morning and at bedtime.  ?  ? ?Allergies:   Patient has no known allergies.  ? ?Social History  ? ?Socioeconomic History  ? Marital status: Widowed  ?  Spouse name: Not on file  ? Number of children: Not on file  ? Years of education: Not on  file  ? Highest education level: Not on file  ?Occupational History  ? Occupation: retired  ?Tobacco Use  ? Smoking status: Former  ?  Years: 55.00  ?  Types: Cigarettes  ? Smokeless tobacco: Never  ?Vaping Use  ? Vaping Use: Never used  ?Substance and Sexual Activity  ? Alcohol use: No  ? Drug use: No  ? Sexual activity: Never  ?Other Topics Concern  ? Not on file  ?Social History Narrative  ? Not on file  ? ?Social Determinants of Health  ? ?Financial Resource Strain: Not on file  ?Food Insecurity: Not on file  ?Transportation Needs: Not on file  ?Physical Activity: Not on file  ?Stress: Not on file  ?Social Connections: Not on file  ?  ? ?Family History: ?The patient's mother had diabetes.   1 of her brothers had diabetes and drug issues and has passed away.  Another brother is alive and well at age 18.  She has a healthy sister.  2 of her daughters have hypertension there does not appear to be a prominent family history of coronary disease. ?ROS:   ?Please see the history of present illness.    ?All other systems are reviewed and are negative.  ? ?EKGs/Labs/Other Studies Reviewed:   ? ?EKG:  EKG is ordered today.  It shows sinus rhythm with a single PAC and nonspecific ST-T changes in multiple leads.  There is borderline voltage for LVH.  There is sagging ST segments depression in the inferior leads and V5-V6 but with positive T waves.  QTc is borderline prolonged at 477 ms. ? ?Recent Labs: ?01/24/2022: ALT 10; BUN 26; Creatinine, Ser 1.16; Hemoglobin 11.2; Platelets 200; Potassium 4.4; Sodium 145  ?Recent Lipid Panel ?September 07, 2018 hemoglobin 10.8, creatinine 1.21, potassium 5.3,  TSH 0.335 ?October 2020 TSH 0.19 ?October 29, 2019 hemoglobin 10.8, creatinine 1.66, potassium 5.0, normal liver function tests ? ?Lipid Panel  ?   ?Component Value Date/Time  ? CHOL 137 01/24/2022 0854  ? TRIG 100 01/24/2022 0854  ? HDL 34 (L) 01/24/2022 0854  ? CHOLHDL 4.0 01/24/2022 0854  ? [place] 84 01/24/2022 0854   ? ?February 2019 cholesterol 160, HDL 31, LDL 115, triglycerides 72 ?October 2020 total cholesterol 164, HDL 40, LDL 111, triglycerides 66 ? ?Physical Exam:   ? ?VS:  BP 125/80   Pulse 76   Ht '5\' 6"'$  (1.676 m)   Wt 136 lb 3.2 oz (61.8 kg)   SpO2 97%   BMI 21.98 kg/m?    ?Recheck BP 105/72 mmHg ? ?Wt Readings from Last 3 Encounters:  ?01/24/22 136 lb 3.2 oz (61.8 kg)  ?05/24/21 140 lb 3.2 oz (63.6 kg)  ?04/03/21 135 lb (61.2 kg)  ?  ? ? ?General: Alert, oriented x3, no distress, very lean ?Head: no evidence of trauma, PERRL, EOMI, no exophtalmos or lid lag, no myxedema, no xanthelasma; normal ears, nose and oropharynx ?Neck: normal jugular  venous pulsations and no hepatojugular reflux; brisk carotid pulses without delay and no carotid bruits ?Chest: clear to auscultation, no signs of consolidation by percussion or palpation, normal fremitus, symmetrical and full respiratory excursions ?Cardiovascular: normal position and quality of the apical impulse, regular rhythm, normal first and second heart sounds, no murmurs, rubs or gallops ?Abdomen: no tenderness or distention, no masses by palpation, no abnormal pulsatility or arterial bruits, normal bowel sounds, no hepatosplenomegaly ?Extremities: no clubbing, cyanosis or edema; 2+ radial, ulnar and brachial pulses bilaterally; 2+ right femoral, posterior tibial and dorsalis pedis pulses; 2+ left femoral, posterior tibial and dorsalis pedis pulses; no subclavian or femoral bruits ?Neurological: grossly nonfocal ?Psych: Normal mood and affect ? ? ?ASSESSMENT:   ? ?1. Iron deficiency anemia, unspecified iron deficiency anemia type   ?2. Chronic combined systolic and diastolic congestive heart failure ([place])   ?3. Essential hypertension   ?4. Hyperthyroidism   ?5. Dyslipidemia (high LDL; low HDL)   ? ? ?PLAN:   ? ?In order of problems listed above: ? ?CHF: Appears euvolemic and has NYHA functional class I on a very low dose of diuretics.  On carvedilol.  Losartan was  stopped due to hypotensive syncope.  7 no room to really escalate heart failure therapy ?HTN: Normal BP on current medications. ?Hyperthyroidism/thyroid nodule: Normal TSH 08/31/2021 at 0.860. ?Anemia/history of bleeding

## 2022-02-04 ENCOUNTER — Encounter: Payer: Self-pay | Admitting: *Deleted

## 2022-06-16 DIAGNOSIS — S0990XA Unspecified injury of head, initial encounter: Secondary | ICD-10-CM | POA: Diagnosis not present

## 2022-06-16 DIAGNOSIS — M542 Cervicalgia: Secondary | ICD-10-CM | POA: Diagnosis not present

## 2022-06-16 DIAGNOSIS — S0181XA Laceration without foreign body of other part of head, initial encounter: Secondary | ICD-10-CM | POA: Diagnosis not present

## 2022-06-16 DIAGNOSIS — R519 Headache, unspecified: Secondary | ICD-10-CM | POA: Diagnosis not present

## 2022-06-16 DIAGNOSIS — S199XXA Unspecified injury of neck, initial encounter: Secondary | ICD-10-CM | POA: Diagnosis not present

## 2022-06-16 DIAGNOSIS — S0993XA Unspecified injury of face, initial encounter: Secondary | ICD-10-CM | POA: Diagnosis not present

## 2022-06-16 DIAGNOSIS — R221 Localized swelling, mass and lump, neck: Secondary | ICD-10-CM | POA: Diagnosis not present

## 2022-06-16 DIAGNOSIS — S01112A Laceration without foreign body of left eyelid and periocular area, initial encounter: Secondary | ICD-10-CM | POA: Diagnosis not present

## 2022-06-28 DIAGNOSIS — Z0000 Encounter for general adult medical examination without abnormal findings: Secondary | ICD-10-CM | POA: Diagnosis not present

## 2022-06-28 DIAGNOSIS — I10 Essential (primary) hypertension: Secondary | ICD-10-CM | POA: Diagnosis not present

## 2022-06-28 DIAGNOSIS — I498 Other specified cardiac arrhythmias: Secondary | ICD-10-CM | POA: Diagnosis not present

## 2022-06-28 DIAGNOSIS — I480 Paroxysmal atrial fibrillation: Secondary | ICD-10-CM | POA: Diagnosis not present

## 2022-06-28 DIAGNOSIS — I5022 Chronic systolic (congestive) heart failure: Secondary | ICD-10-CM | POA: Diagnosis not present

## 2022-06-28 DIAGNOSIS — R262 Difficulty in walking, not elsewhere classified: Secondary | ICD-10-CM | POA: Diagnosis not present

## 2022-06-28 DIAGNOSIS — Z7189 Other specified counseling: Secondary | ICD-10-CM | POA: Diagnosis not present

## 2022-06-28 DIAGNOSIS — Z23 Encounter for immunization: Secondary | ICD-10-CM | POA: Diagnosis not present

## 2022-06-28 DIAGNOSIS — Z9181 History of falling: Secondary | ICD-10-CM | POA: Diagnosis not present

## 2022-06-28 DIAGNOSIS — S0180XD Unspecified open wound of other part of head, subsequent encounter: Secondary | ICD-10-CM | POA: Diagnosis not present

## 2022-08-02 DIAGNOSIS — I498 Other specified cardiac arrhythmias: Secondary | ICD-10-CM | POA: Diagnosis not present

## 2022-08-02 DIAGNOSIS — I5022 Chronic systolic (congestive) heart failure: Secondary | ICD-10-CM | POA: Diagnosis not present

## 2022-08-02 DIAGNOSIS — R262 Difficulty in walking, not elsewhere classified: Secondary | ICD-10-CM | POA: Diagnosis not present

## 2022-08-02 DIAGNOSIS — I110 Hypertensive heart disease with heart failure: Secondary | ICD-10-CM | POA: Diagnosis not present

## 2022-08-02 DIAGNOSIS — W19XXXD Unspecified fall, subsequent encounter: Secondary | ICD-10-CM | POA: Diagnosis not present

## 2022-08-02 DIAGNOSIS — I480 Paroxysmal atrial fibrillation: Secondary | ICD-10-CM | POA: Diagnosis not present

## 2022-08-08 DIAGNOSIS — I110 Hypertensive heart disease with heart failure: Secondary | ICD-10-CM | POA: Diagnosis not present

## 2022-08-08 DIAGNOSIS — I480 Paroxysmal atrial fibrillation: Secondary | ICD-10-CM | POA: Diagnosis not present

## 2022-08-08 DIAGNOSIS — I498 Other specified cardiac arrhythmias: Secondary | ICD-10-CM | POA: Diagnosis not present

## 2022-08-08 DIAGNOSIS — R262 Difficulty in walking, not elsewhere classified: Secondary | ICD-10-CM | POA: Diagnosis not present

## 2022-08-08 DIAGNOSIS — I5022 Chronic systolic (congestive) heart failure: Secondary | ICD-10-CM | POA: Diagnosis not present

## 2022-08-08 DIAGNOSIS — W19XXXD Unspecified fall, subsequent encounter: Secondary | ICD-10-CM | POA: Diagnosis not present

## 2022-08-13 DIAGNOSIS — I5022 Chronic systolic (congestive) heart failure: Secondary | ICD-10-CM | POA: Diagnosis not present

## 2022-08-13 DIAGNOSIS — W19XXXD Unspecified fall, subsequent encounter: Secondary | ICD-10-CM | POA: Diagnosis not present

## 2022-08-13 DIAGNOSIS — I498 Other specified cardiac arrhythmias: Secondary | ICD-10-CM | POA: Diagnosis not present

## 2022-08-13 DIAGNOSIS — R262 Difficulty in walking, not elsewhere classified: Secondary | ICD-10-CM | POA: Diagnosis not present

## 2022-08-13 DIAGNOSIS — I110 Hypertensive heart disease with heart failure: Secondary | ICD-10-CM | POA: Diagnosis not present

## 2022-08-13 DIAGNOSIS — I480 Paroxysmal atrial fibrillation: Secondary | ICD-10-CM | POA: Diagnosis not present

## 2022-08-15 DIAGNOSIS — I5022 Chronic systolic (congestive) heart failure: Secondary | ICD-10-CM | POA: Diagnosis not present

## 2022-08-15 DIAGNOSIS — I110 Hypertensive heart disease with heart failure: Secondary | ICD-10-CM | POA: Diagnosis not present

## 2022-08-15 DIAGNOSIS — I498 Other specified cardiac arrhythmias: Secondary | ICD-10-CM | POA: Diagnosis not present

## 2022-08-15 DIAGNOSIS — R262 Difficulty in walking, not elsewhere classified: Secondary | ICD-10-CM | POA: Diagnosis not present

## 2022-08-15 DIAGNOSIS — I480 Paroxysmal atrial fibrillation: Secondary | ICD-10-CM | POA: Diagnosis not present

## 2022-08-15 DIAGNOSIS — W19XXXD Unspecified fall, subsequent encounter: Secondary | ICD-10-CM | POA: Diagnosis not present

## 2022-08-20 DIAGNOSIS — I480 Paroxysmal atrial fibrillation: Secondary | ICD-10-CM | POA: Diagnosis not present

## 2022-08-20 DIAGNOSIS — I110 Hypertensive heart disease with heart failure: Secondary | ICD-10-CM | POA: Diagnosis not present

## 2022-08-20 DIAGNOSIS — I498 Other specified cardiac arrhythmias: Secondary | ICD-10-CM | POA: Diagnosis not present

## 2022-08-20 DIAGNOSIS — W19XXXD Unspecified fall, subsequent encounter: Secondary | ICD-10-CM | POA: Diagnosis not present

## 2022-08-20 DIAGNOSIS — I5022 Chronic systolic (congestive) heart failure: Secondary | ICD-10-CM | POA: Diagnosis not present

## 2022-08-20 DIAGNOSIS — R262 Difficulty in walking, not elsewhere classified: Secondary | ICD-10-CM | POA: Diagnosis not present

## 2022-08-27 DIAGNOSIS — I110 Hypertensive heart disease with heart failure: Secondary | ICD-10-CM | POA: Diagnosis not present

## 2022-08-27 DIAGNOSIS — I5022 Chronic systolic (congestive) heart failure: Secondary | ICD-10-CM | POA: Diagnosis not present

## 2022-08-27 DIAGNOSIS — I498 Other specified cardiac arrhythmias: Secondary | ICD-10-CM | POA: Diagnosis not present

## 2022-08-27 DIAGNOSIS — W19XXXD Unspecified fall, subsequent encounter: Secondary | ICD-10-CM | POA: Diagnosis not present

## 2022-08-27 DIAGNOSIS — R262 Difficulty in walking, not elsewhere classified: Secondary | ICD-10-CM | POA: Diagnosis not present

## 2022-08-27 DIAGNOSIS — I480 Paroxysmal atrial fibrillation: Secondary | ICD-10-CM | POA: Diagnosis not present

## 2022-09-10 DIAGNOSIS — R262 Difficulty in walking, not elsewhere classified: Secondary | ICD-10-CM | POA: Diagnosis not present

## 2022-09-10 DIAGNOSIS — I110 Hypertensive heart disease with heart failure: Secondary | ICD-10-CM | POA: Diagnosis not present

## 2022-09-10 DIAGNOSIS — I5022 Chronic systolic (congestive) heart failure: Secondary | ICD-10-CM | POA: Diagnosis not present

## 2022-09-10 DIAGNOSIS — I480 Paroxysmal atrial fibrillation: Secondary | ICD-10-CM | POA: Diagnosis not present

## 2022-09-10 DIAGNOSIS — W19XXXD Unspecified fall, subsequent encounter: Secondary | ICD-10-CM | POA: Diagnosis not present

## 2022-09-10 DIAGNOSIS — I498 Other specified cardiac arrhythmias: Secondary | ICD-10-CM | POA: Diagnosis not present

## 2022-09-17 DIAGNOSIS — I498 Other specified cardiac arrhythmias: Secondary | ICD-10-CM | POA: Diagnosis not present

## 2022-09-17 DIAGNOSIS — I110 Hypertensive heart disease with heart failure: Secondary | ICD-10-CM | POA: Diagnosis not present

## 2022-09-17 DIAGNOSIS — R262 Difficulty in walking, not elsewhere classified: Secondary | ICD-10-CM | POA: Diagnosis not present

## 2022-09-17 DIAGNOSIS — W19XXXD Unspecified fall, subsequent encounter: Secondary | ICD-10-CM | POA: Diagnosis not present

## 2022-09-17 DIAGNOSIS — I480 Paroxysmal atrial fibrillation: Secondary | ICD-10-CM | POA: Diagnosis not present

## 2022-09-17 DIAGNOSIS — I5022 Chronic systolic (congestive) heart failure: Secondary | ICD-10-CM | POA: Diagnosis not present

## 2022-10-23 IMAGING — CT CT HEAD W/O CM
4 series · 17 of 47 positions shown, 19 images · non-contrast
Comparison: None.

CLINICAL DATA: Lethargy.  History of dementia

EXAM:
CT HEAD WITHOUT CONTRAST
TECHNIQUE: Contiguous axial images were obtained from the base of the skull
through the vertex without intravenous contrast.

[Series 3: head wo · axial · 0.48mm/px · z∈[+1430,+1550]mm · 7 of 34 slices shown, 9 images]
[im 5/34  brain]
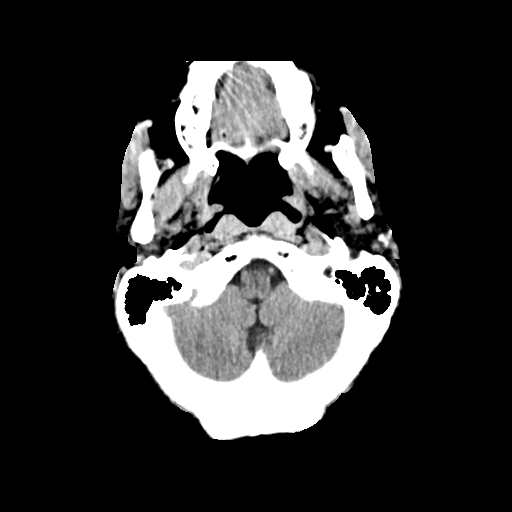
[im 5/34  bone]
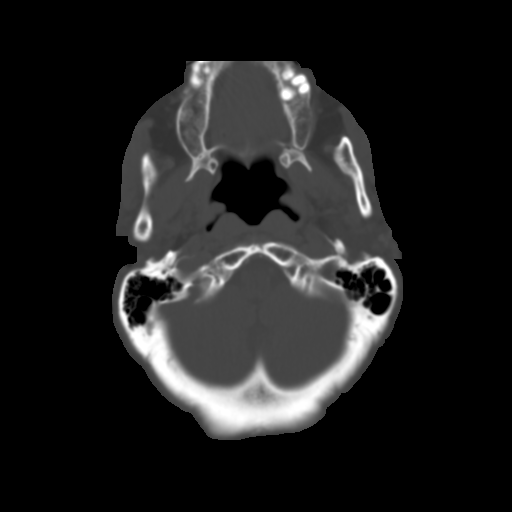
[im 9/34  brain]
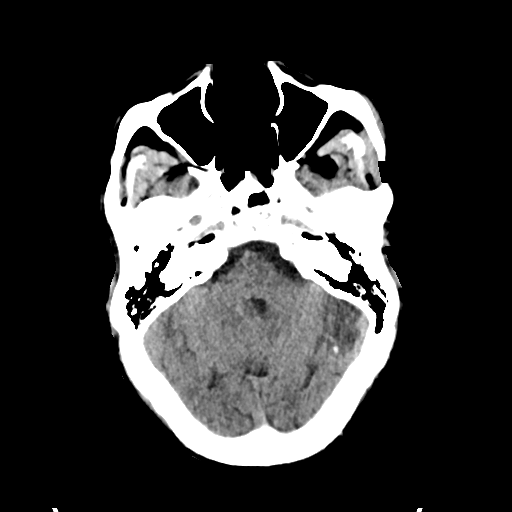
[im 13/34  brain]
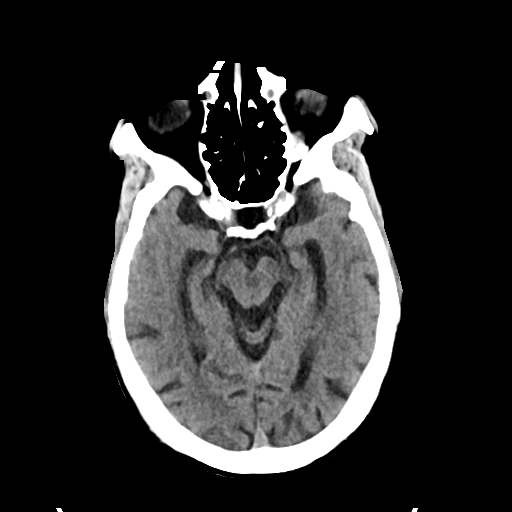
[im 17/34  brain]
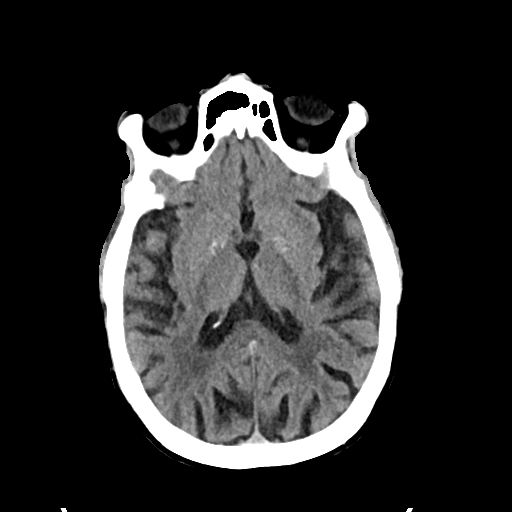
[im 21/34  brain]
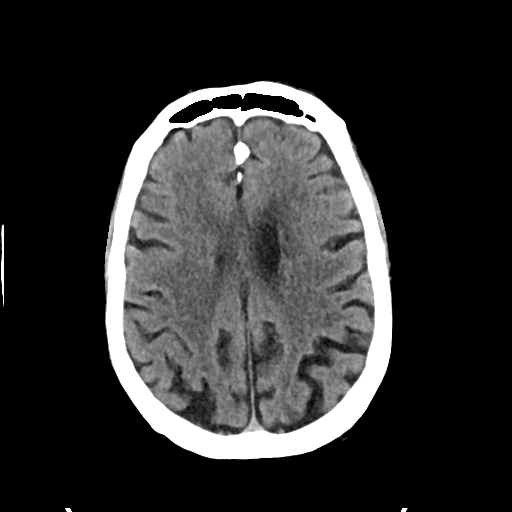
[im 21/34  bone]
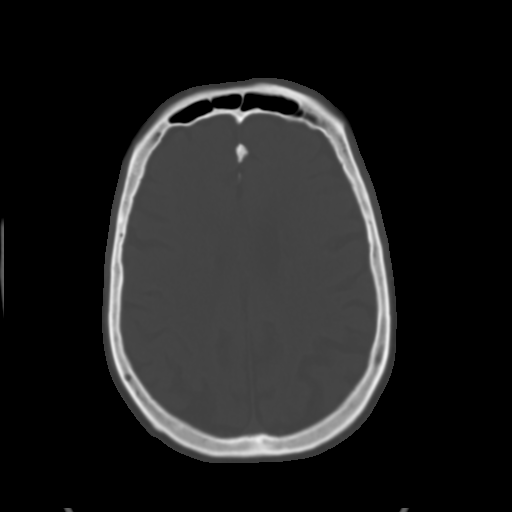
[im 25/34  brain]
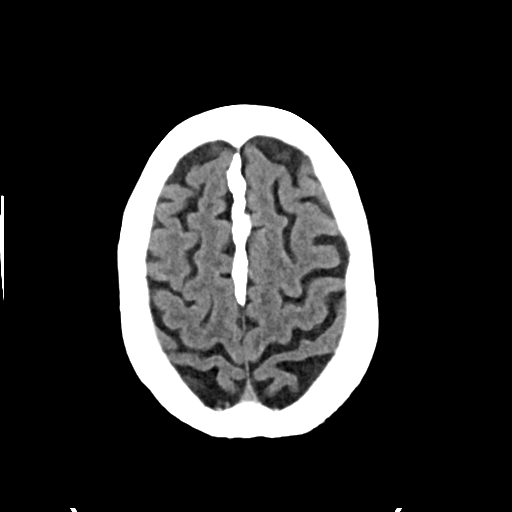
[im 29/34  brain]
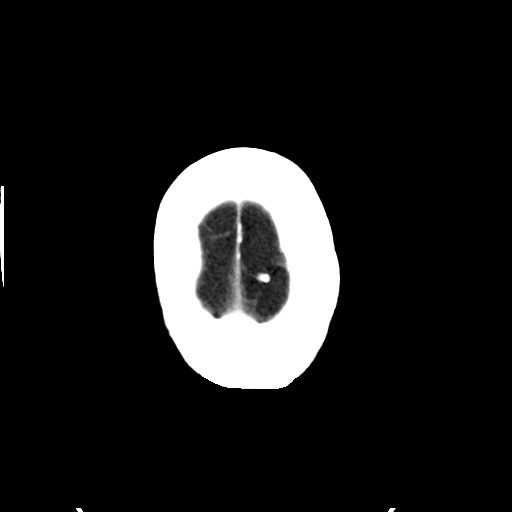

[Series 4: head bone · axial · 0.48mm/px · z∈[+1426,+1484]mm · 4 of 85 slices shown]
[im 9/85  bone]
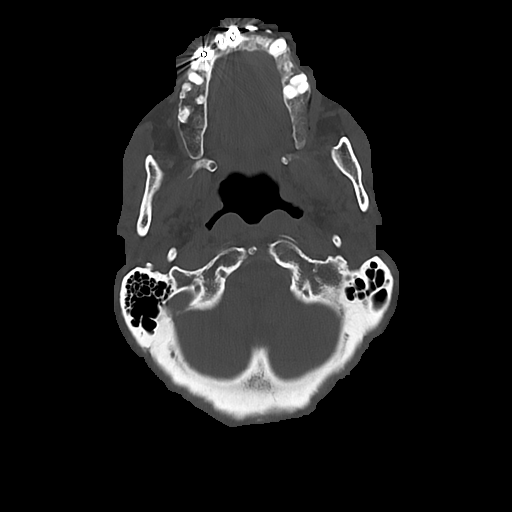
[im 17/85  bone]
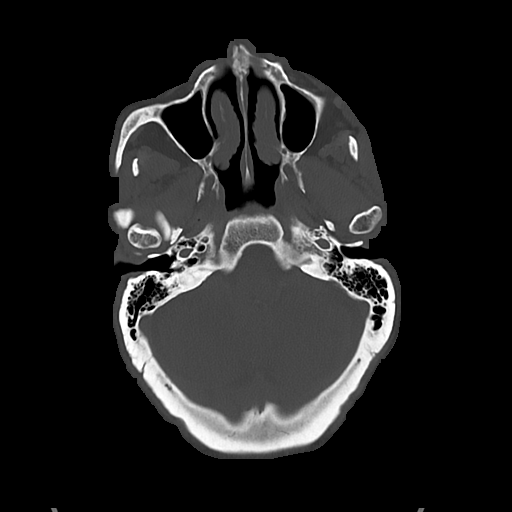
[im 26/85  bone]
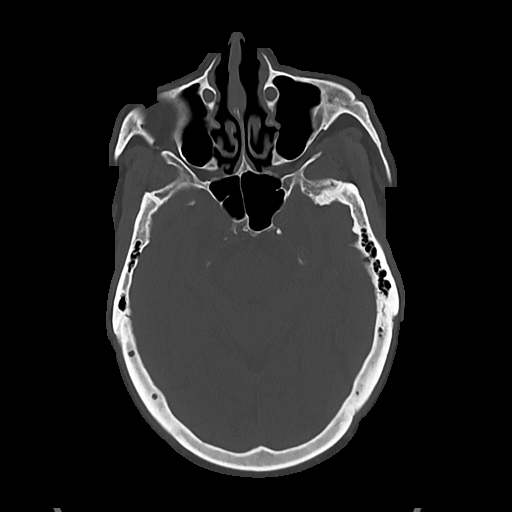
[im 38/85  bone]
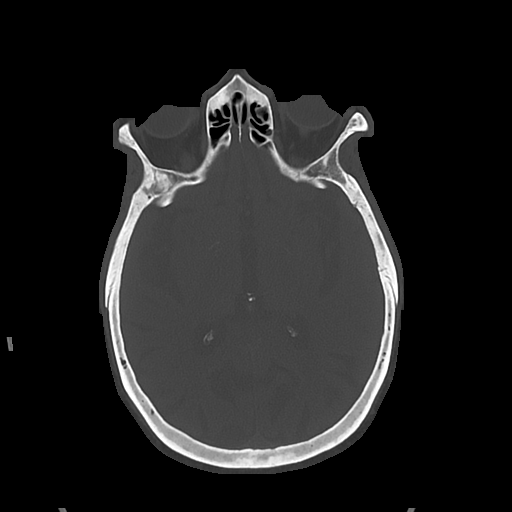

[Series 5: cor soft · coronal · 0.35mm/px · 3 of 73 slices shown]
[im 25/73  brain]
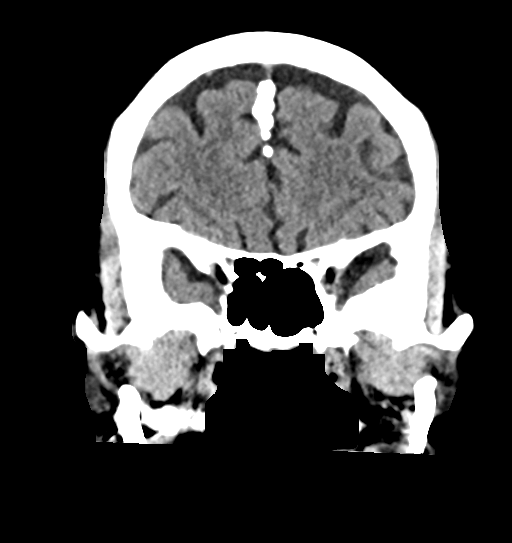
[im 33/73  brain]
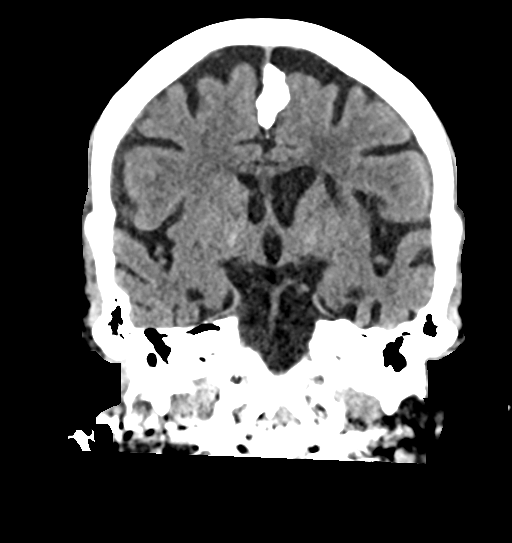
[im 41/73  brain]
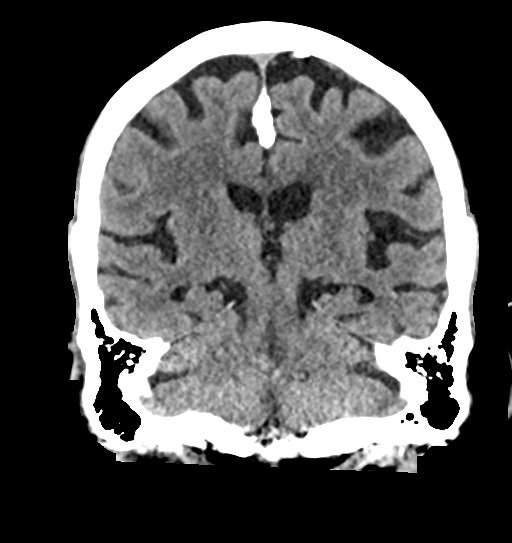

[Series 6: sag soft · sagittal · 0.37mm/px · 3 of 60 slices shown]
[im 20/60  brain]
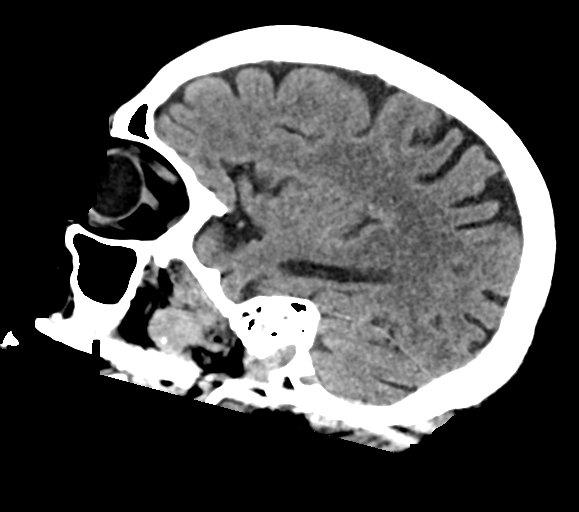
[im 30/60  brain]
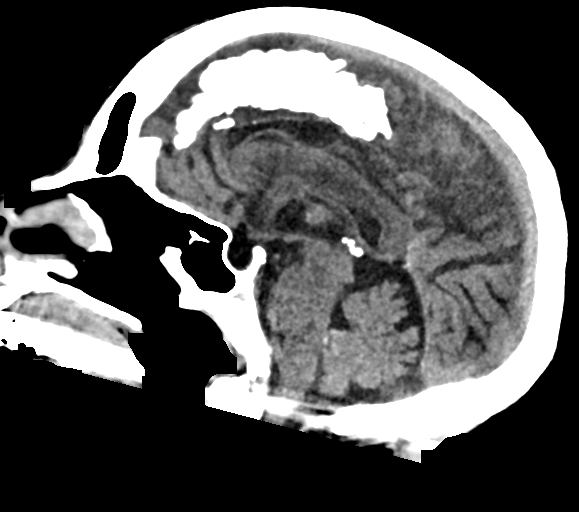
[im 40/60  brain]
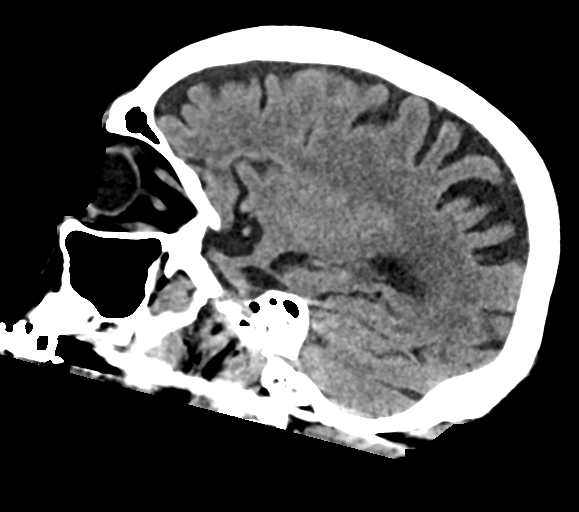

[17 of 47 positions shown; findings below may reference images not displayed]

FINDINGS: Brain: No evidence of acute infarction, hemorrhage, hydrocephalus,
extra-axial collection or mass lesion/mass effect. Generalized
atrophy. Remote perforator infarct at the left corona radiata.

Vascular: No hyperdense vessel or unexpected calcification.

Skull: Normal. Negative for fracture or focal lesion.

Sinuses/Orbits: No acute finding.
IMPRESSION: No acute or reversible finding.

## 2022-12-06 DIAGNOSIS — Z9989 Dependence on other enabling machines and devices: Secondary | ICD-10-CM | POA: Diagnosis not present

## 2022-12-06 DIAGNOSIS — D6869 Other thrombophilia: Secondary | ICD-10-CM | POA: Diagnosis not present

## 2022-12-06 DIAGNOSIS — I130 Hypertensive heart and chronic kidney disease with heart failure and stage 1 through stage 4 chronic kidney disease, or unspecified chronic kidney disease: Secondary | ICD-10-CM | POA: Diagnosis not present

## 2022-12-06 DIAGNOSIS — I480 Paroxysmal atrial fibrillation: Secondary | ICD-10-CM | POA: Diagnosis not present

## 2022-12-06 DIAGNOSIS — J309 Allergic rhinitis, unspecified: Secondary | ICD-10-CM | POA: Diagnosis not present

## 2022-12-06 DIAGNOSIS — R54 Age-related physical debility: Secondary | ICD-10-CM | POA: Diagnosis not present

## 2022-12-06 DIAGNOSIS — E785 Hyperlipidemia, unspecified: Secondary | ICD-10-CM | POA: Diagnosis not present

## 2022-12-06 DIAGNOSIS — N1831 Chronic kidney disease, stage 3a: Secondary | ICD-10-CM | POA: Diagnosis not present

## 2022-12-06 DIAGNOSIS — I5022 Chronic systolic (congestive) heart failure: Secondary | ICD-10-CM | POA: Diagnosis not present

## 2022-12-06 DIAGNOSIS — E46 Unspecified protein-calorie malnutrition: Secondary | ICD-10-CM | POA: Diagnosis not present

## 2023-07-18 ENCOUNTER — Other Ambulatory Visit: Payer: Self-pay | Admitting: Internal Medicine

## 2023-07-18 DIAGNOSIS — I480 Paroxysmal atrial fibrillation: Secondary | ICD-10-CM | POA: Diagnosis not present

## 2023-07-18 DIAGNOSIS — I5022 Chronic systolic (congestive) heart failure: Secondary | ICD-10-CM | POA: Diagnosis not present

## 2023-07-18 DIAGNOSIS — E441 Mild protein-calorie malnutrition: Secondary | ICD-10-CM | POA: Diagnosis not present

## 2023-07-18 DIAGNOSIS — Z9989 Dependence on other enabling machines and devices: Secondary | ICD-10-CM | POA: Diagnosis not present

## 2023-07-18 DIAGNOSIS — E785 Hyperlipidemia, unspecified: Secondary | ICD-10-CM | POA: Diagnosis not present

## 2023-07-18 DIAGNOSIS — I10 Essential (primary) hypertension: Secondary | ICD-10-CM | POA: Diagnosis not present

## 2023-07-18 DIAGNOSIS — R54 Age-related physical debility: Secondary | ICD-10-CM | POA: Diagnosis not present

## 2023-07-18 DIAGNOSIS — E2839 Other primary ovarian failure: Secondary | ICD-10-CM

## 2023-07-18 DIAGNOSIS — I130 Hypertensive heart and chronic kidney disease with heart failure and stage 1 through stage 4 chronic kidney disease, or unspecified chronic kidney disease: Secondary | ICD-10-CM | POA: Diagnosis not present

## 2023-07-18 DIAGNOSIS — Z0000 Encounter for general adult medical examination without abnormal findings: Secondary | ICD-10-CM | POA: Diagnosis not present

## 2023-07-18 DIAGNOSIS — N1831 Chronic kidney disease, stage 3a: Secondary | ICD-10-CM | POA: Diagnosis not present

## 2024-01-14 ENCOUNTER — Encounter: Payer: Self-pay | Admitting: Nurse Practitioner

## 2024-01-14 ENCOUNTER — Ambulatory Visit: Attending: Nurse Practitioner | Admitting: Nurse Practitioner

## 2024-01-14 VITALS — BP 122/70 | HR 84 | Ht 63.0 in | Wt 138.6 lb

## 2024-01-14 DIAGNOSIS — I5032 Chronic diastolic (congestive) heart failure: Secondary | ICD-10-CM | POA: Diagnosis not present

## 2024-01-14 DIAGNOSIS — E782 Mixed hyperlipidemia: Secondary | ICD-10-CM | POA: Diagnosis not present

## 2024-01-14 DIAGNOSIS — I10 Essential (primary) hypertension: Secondary | ICD-10-CM | POA: Diagnosis not present

## 2024-01-14 DIAGNOSIS — Z87898 Personal history of other specified conditions: Secondary | ICD-10-CM | POA: Diagnosis not present

## 2024-01-14 NOTE — Progress Notes (Signed)
 Office Visit    Patient Name: Diane Ross Date of Encounter: 01/14/2024  Primary Care Provider:  Lorenda Ishihara, MD Primary Cardiologist:  Thurmon Fair, MD  Chief Complaint    89 year old female with a history of recurrent syncope, heart failure with improved EF,  hypertension, hyperlipidemia, breast cancer, dementia, and depression who presents for follow-up related to heart failure.  Past Medical History    Past Medical History:  Diagnosis Date   Breast cancer (HCC)    left side   Hypertension    Thyroid disorder    Past Surgical History:  Procedure Laterality Date   ABDOMINAL HYSTERECTOMY     BREAST LUMPECTOMY Left    ESOPHAGOGASTRODUODENOSCOPY N/A 03/29/2018   Procedure: ESOPHAGOGASTRODUODENOSCOPY (EGD);  Surgeon: Jeani Hawking, MD;  Location: [facility] ENDOSCOPY;  Service: Endoscopy;  Laterality: N/A;    Allergies  No Known Allergies   Labs/Other Studies Reviewed    The following studies were reviewed today:  Cardiac Studies & Procedures   ______________________________________________________________________________________________   STRESS TESTS  MYOCARDIAL PERFUSION IMAGING 11/24/2017  Narrative  Nuclear stress EF: 45%. Mild diffuse hypokinesis  There was no ST segment deviation noted during stress.  The study is normal. No perfusion defects identified. Dilated LV.  This is an intermediate risk study based upon mildly reduced left ventricular ejection fraction. Non-ischemic cardiomyopathy.  Donato Schultz, MD   ECHOCARDIOGRAM  ECHOCARDIOGRAM COMPLETE 01/29/2021  Narrative ECHOCARDIOGRAM REPORT    Patient Name:   Diane Ross Date of Exam: 01/29/2021 Medical Rec #:  [id number]     Height:       65.0 in Accession #:    [id number]    Weight:       138.4 lb Date of Birth:  Jul 26, 1931    BSA:          1.692 m Patient Age:    89 years      BP:           150/76 mmHg Patient Gender: F             HR:           72 bpm. Exam Location:  [address]  Procedure: 2D Echo, Cardiac Doppler and Color Doppler  Indications:    R55 Pre-Syncope  History:        Patient has prior history of Echocardiogram examinations, most recent 01/12/2018. CHF; Risk Factors:Hypertension and Dyslipidemia. Fatigue. Hyperthyroidism. Anemia.  Sonographer:    Cathie Beams RCS Referring Phys: [phone] HAO MENG  IMPRESSIONS   1. Left ventricular ejection fraction, by estimation, is 55%. The left ventricle has normal function. The left ventricle has no regional wall motion abnormalities. Left ventricular diastolic parameters are consistent with Grade I diastolic dysfunction (impaired relaxation). 2. Right ventricular systolic function is normal. The right ventricular size is normal. There is normal pulmonary artery systolic pressure. The estimated right ventricular systolic pressure is 16.4 mmHg. 3. Left atrial size was severely dilated. 4. Right atrial size was mildly dilated. 5. The mitral valve is normal in structure. Trivial mitral valve regurgitation. No evidence of mitral stenosis. Moderate mitral annular calcification. 6. The aortic valve is tricuspid. Aortic valve regurgitation is trivial. Mild aortic valve sclerosis is present, with no evidence of aortic valve stenosis. 7. The inferior vena cava is normal in size with greater than 50% respiratory variability, suggesting right atrial pressure of 3 mmHg.  FINDINGS Left Ventricle: Left ventricular ejection fraction, by estimation, is 55%. The left ventricle has normal function. The left ventricle has  no regional wall motion abnormalities. The left ventricular internal cavity size was normal in size. There is no left ventricular hypertrophy. Left ventricular diastolic parameters are consistent with Grade I diastolic dysfunction (impaired relaxation).  Right Ventricle: The right ventricular size is normal. No increase in right ventricular wall thickness. Right ventricular systolic function is normal.  There is normal pulmonary artery systolic pressure. The tricuspid regurgitant velocity is 1.83 m/s, and with an assumed right atrial pressure of 3 mmHg, the estimated right ventricular systolic pressure is 16.4 mmHg.  Left Atrium: Left atrial size was severely dilated.  Right Atrium: Right atrial size was mildly dilated.  Pericardium: There is no evidence of pericardial effusion.  Mitral Valve: The mitral valve is normal in structure. There is mild calcification of the mitral valve leaflet(s). Moderate mitral annular calcification. Trivial mitral valve regurgitation. No evidence of mitral valve stenosis.  Tricuspid Valve: The tricuspid valve is normal in structure. Tricuspid valve regurgitation is trivial.  Aortic Valve: The aortic valve is tricuspid. Aortic valve regurgitation is trivial. Mild aortic valve sclerosis is present, with no evidence of aortic valve stenosis.  Pulmonic Valve: The pulmonic valve was normal in structure. Pulmonic valve regurgitation is not visualized.  Aorta: The aortic root is normal in size and structure.  Venous: The inferior vena cava is normal in size with greater than 50% respiratory variability, suggesting right atrial pressure of 3 mmHg.  IAS/Shunts: No atrial level shunt detected by color flow Doppler.   LEFT VENTRICLE PLAX 2D LVIDd:         3.40 cm  Diastology LVIDs:         2.30 cm  LV e' medial:    5.36 cm/s LV PW:         1.20 cm  LV E/e' medial:  14.4 LV IVS:        1.00 cm  LV e' lateral:   7.22 cm/s LVOT diam:     2.15 cm  LV E/e' lateral: 10.7 LV SV:         60 LV SV Index:   36 LVOT Area:     3.63 cm   RIGHT VENTRICLE RV Basal diam:  1.90 cm RV S prime:     11.30 cm/s TAPSE (M-mode): 2.3 cm  LEFT ATRIUM             Index       RIGHT ATRIUM           Index LA diam:        5.30 cm 3.13 cm/m  RA Area:     18.30 cm LA Vol (A2C):   77.1 ml 45.58 ml/m RA Volume:   48.60 ml  28.73 ml/m LA Vol (A4C):   97.4 ml 57.58 ml/m LA  Biplane Vol: 88.9 ml 52.55 ml/m AORTIC VALVE LVOT Vmax:   73.80 cm/s LVOT Vmean:  42.600 cm/s LVOT VTI:    0.166 m  AORTA Ao Root diam: 3.10 cm  MITRAL VALVE               TRICUSPID VALVE MV Area (PHT): 2.87 cm    TR Peak grad:   13.4 mmHg MV Decel Time: 264 msec    TR Vmax:        183.00 cm/s MV E velocity: 77.10 cm/s MV A velocity: 77.10 cm/s  SHUNTS MV E/A ratio:  1.00        Systemic VTI:  0.17 m Systemic Diam: 2.15 cm  Marca Ancona MD Electronically signed  by Marca Ancona MD Signature Date/Time: 01/29/2021/3:36:38 PM    Final    MONITORS  LONG TERM MONITOR-LIVE TELEMETRY (3-14 DAYS) 02/20/2021  Narrative  Predominant rhythm is normal sinus rhythm with normal circadian variation.  There are occasional episodes of second-degree atrioventricular block, Mobitz type I, associated with transient bradycardia. The minimum heart rate is 22 bpm.  There are frequent PACs (12.3%), frequent atrial couplets (2.6%) and frequent episodes of nonsustained atrial tachycardia (4-13 beats, maximum 167 bpm), but there is no evidence of true atrial fibrillation.  The episodes of atrial tachyarrhythmia are seen exclusively during daytime hours and are completely abolished during sleep.  Rare PVCs are present and there is no significant complex ventricular arrhythmia.  Abnormal event monitor to the presence of  Very frequent premature atrial complexes and frequent episodes of nonsustained atrial tachycardia, occurring during daytime hours and absent at night.  There is no evidence of atrial fibrillation.  During sleep, there are occasional brief episodes of bradycardia due to second-degree atrioventricular block, Mobitz type I.       ______________________________________________________________________________________________     Recent Labs: No results found for requested labs within last 365 days.  Recent Lipid Panel    Component Value Date/Time   CHOL 137 01/24/2022 0854    TRIG 100 01/24/2022 0854   HDL 34 (L) 01/24/2022 0854   CHOLHDL 4.0 01/24/2022 0854   LDLCALC 84 01/24/2022 0854    History of Present Illness    89 year old female with the above past medical history including recurrent syncope, heart failure with improved EF, hypertension, hyperlipidemia, breast cancer, dementia, and depression.  Myoview in 2019 showed EF 45%, no perfusion defect identified, intermediate risk due to reduced EF.  Echocardiogram obtained in 01/2018 showed EF 50 to 55%, G1 DD, moderate to severely calcified mitral valve with mild mitral valve regurgitation, LA. Repeat echocardiogram in 2022 following multiple episodes of syncope showed EF 55%, normal LV function, no RWMA, G1 DD, normal RV systolic function, severely dilated left atrium, mildly dilated right atrium, moderate MAC.  Monitor in 02/2021 revealed predominantly normal sinus rhythm, second-degree AV block, Mobitz type I, frequent PACs, frequent episodes of nonsustained atrial tachycardia, no evidence of atrial fibrillation.  Neurological workup showed normal brain MRI, normal EEG. It was felt that her syncope was likely due to transient hypotension. She was last seen in the office on 01/24/2022 and was stable from a cardiac standpoint.  She presents today for follow-up accompanied by her daughter.  Since her last visit she has been stable overall from a cardiac standpoint.  She reports occasional shortness of breath, increased bilateral lower extremity edema.  She denies chest pain, palpitations, dizziness, PND, orthopnea, weight gain.  She denies any recurrent syncope.   Home Medications    Current Outpatient Medications  Medication Sig Dispense Refill   atorvastatin (LIPITOR) 20 MG tablet Take 20 mg by mouth daily.     carvedilol (COREG) 6.25 MG tablet Take 1 tablet (6.25 mg total) by mouth 2 (two) times daily with a meal. 180 tablet 3   divalproex (DEPAKOTE) 250 MG DR tablet Take 250 mg by mouth 2 (two) times daily.      donepezil (ARICEPT) 10 MG tablet Take 10 mg by mouth daily.     furosemide (LASIX) 40 MG tablet Take one tablet, 40 mg, three days a week: Monday, Wednesday and Friday 30 tablet    hydrALAZINE (APRESOLINE) 10 MG tablet Take 10 mg by mouth.     mirtazapine (REMERON) 7.5 MG  tablet Take 7.5 mg by mouth at bedtime.     Multiple Vitamin (MULTIVITAMIN) tablet Take 1 tablet by mouth daily.     potassium chloride SA (K-DUR) 20 MEQ tablet Take one tablet on Monday, Wednesday and Friday     No current facility-administered medications for this visit.     Review of Systems    She denies chest pain, palpitations, pnd, orthopnea, n, v, dizziness, syncope, weight gain, or early satiety. All other systems reviewed and are otherwise negative except as noted above.   Physical Exam    VS:  BP 122/70 (BP Location: Left Arm, Patient Position: Sitting, Cuff Size: Normal)   Pulse 84   Ht 5\' 3"  (1.6 m)   Wt 138 lb 9.6 oz (62.9 kg)   SpO2 92%   BMI 24.55 kg/m  GEN: Well nourished, well developed, in no acute distress. HEENT: normal. Neck: Supple, no JVD, carotid bruits, or masses. Cardiac: RRR, no murmurs, rubs, or gallops. No clubbing, cyanosis, nonpitting bilateral lower extremity edema.  Radials/DP/PT 2+ and equal bilaterally.  Respiratory:  Respirations regular and unlabored, clear to auscultation bilaterally. GI: Soft, nontender, nondistended, BS + x 4. MS: no deformity or atrophy. Skin: warm and dry, no rash. Neuro:  Strength and sensation are intact. Psych: Normal affect.  Accessory Clinical Findings    ECG personally reviewed by me today - EKG Interpretation Date/Time:  Wednesday January 14 2024 15:01:12 EDT Ventricular Rate:  79 PR Interval:  164 QRS Duration:  76 QT Interval:  424 QTC Calculation: 486 R Axis:   78  Text Interpretation: Sinus rhythm with Premature atrial complexes Nonspecific T wave abnormality Prolonged QT When compared with ECG of 03-Apr-2021 02:39, Premature atrial  complexes are now Present Confirmed by Bernadene Person ([id number]) on 01/14/2024 3:10:04 PM  - no acute changes.   Lab Results  Component Value Date   WBC 5.5 01/24/2022   HGB 11.2 01/24/2022   HCT 34.7 01/24/2022   MCV 89 01/24/2022   PLT 200 01/24/2022   Lab Results  Component Value Date   CREATININE 1.16 (H) 01/24/2022   BUN 26 01/24/2022   NA 145 (H) 01/24/2022   K 4.4 01/24/2022   CL 102 01/24/2022   CO2 30 (H) 01/24/2022   Lab Results  Component Value Date   ALT 10 01/24/2022   AST 22 01/24/2022   ALKPHOS 70 01/24/2022   BILITOT 0.2 01/24/2022   Lab Results  Component Value Date   CHOL 137 01/24/2022   HDL 34 (L) 01/24/2022   LDLCALC 84 01/24/2022   TRIG 100 01/24/2022   CHOLHDL 4.0 01/24/2022    No results found for: "HGBA1C"  Assessment & Plan   1. Heart failure with improved EF: Myoview in 2019 showed EF 45%, no perfusion defect identified, intermediate risk due to reduced EF.  Most recent echocardiogram in 2022 following multiple episodes of syncope showed EF 55%, normal LV function, no RWMA, G1 DD, normal RV systolic function, severely dilated left atrium, mildly dilated right atrium, moderate MAC. She reports occasional shortness of breath, but increased bilateral lower extremity edema.  We discussed possible repeat echocardiogram, however, patient would not be interested in pursuing any invasive procedures/further testing.  Therefore, through shared decision making, will defer repeat echocardiogram and managed conservatively.  No recent labs on file, last available BMET was in 2023.  Will check CBC, BMET, BNP today.  If renal function is stable, will increase Lasix to 40 mg daily with plans to repeat BMET in  2 to 3 weeks.  Continue carvedilol.  2. History of recurrent syncope: Echocardiogram following syncopal episode was stable as above. Monitor in 02/2021 revealed predominantly normal sinus rhythm, second-degree AV block, Mobitz type I, frequent PACs, frequent episodes of  nonsustained atrial tachycardia, no evidence of atrial fibrillation.  Neurological workup showed normal brain MRI, normal EEG. It was felt that her syncope was likely due to transient hypotension.  She denies any recent palpitations, dizziness, presyncope or syncope.  3. Hypertension: BP well controlled. Continue current antihypertensive regimen.  Continue to monitor BP with diuresis.  4. Hyperlipidemia: No recent LDL on file.  Consider repeat lipids at follow-up.  Continue Lipitor.  5. Dementia: Progressive. She requires some assistance in ADLs.  6.. Disposition: Follow-up in 3 to 4 months, sooner if needed.        Joylene Grapes, NP 01/14/2024, 6:56 PM

## 2024-01-14 NOTE — Patient Instructions (Signed)
 Medication Instructions:  NO CHANGES    Lab Work: CBC, BMET, BNP TO BE DONE TODAY.   Testing/Procedures: NONE  Follow-Up: At [facility], you and your health needs are our priority.  As part of our continuing mission to provide you with exceptional heart care, our providers are all part of one team.  This team includes your primary Cardiologist (physician) and Advanced Practice Providers or APPs (Physician Assistants and Nurse Practitioners) who all work together to provide you with the care you need, when you need it.  Your next appointment:   3-4 MONTHS  Provider:   Thurmon Fair, MD     Other Instructions:      1st Floor: - Lobby - Registration  - Pharmacy  - Lab - Cafe  2nd Floor: - PV Lab - Diagnostic Testing (echo, CT, nuclear med)  3rd Floor: - Vacant  4th Floor: - TCTS (cardiothoracic surgery) - AFib Clinic - Structural Heart Clinic - Vascular Surgery  - Vascular Ultrasound  5th Floor: - [facility] (general and EP) - Clinical Pharmacy for coumadin, hypertension, lipid, weight-loss medications, and med management appointments    Valet parking services will be available as well.

## 2024-01-15 LAB — CBC
Hematocrit: 38.6 % (ref 34.0–46.6)
Hemoglobin: 12.2 g/dL (ref 11.1–15.9)
MCH: 29.4 pg (ref 26.6–33.0)
MCHC: 31.6 g/dL (ref 31.5–35.7)
MCV: 93 fL (ref 79–97)
Platelets: 232 10*3/uL (ref 150–450)
RBC: 4.15 x10E6/uL (ref 3.77–5.28)
RDW: 13.8 % (ref 11.7–15.4)
WBC: 6.5 10*3/uL (ref 3.4–10.8)

## 2024-01-15 LAB — BASIC METABOLIC PANEL WITH GFR
BUN/Creatinine Ratio: 14 (ref 12–28)
BUN: 18 mg/dL (ref 10–36)
CO2: 28 mmol/L (ref 20–29)
Calcium: 8.9 mg/dL (ref 8.7–10.3)
Chloride: 102 mmol/L (ref 96–106)
Creatinine, Ser: 1.3 mg/dL — ABNORMAL HIGH (ref 0.57–1.00)
Glucose: 93 mg/dL (ref 70–99)
Potassium: 5.2 mmol/L (ref 3.5–5.2)
Sodium: 142 mmol/L (ref 134–144)
eGFR: 39 mL/min/{1.73_m2} — ABNORMAL LOW (ref >59)

## 2024-01-15 LAB — BRAIN NATRIURETIC PEPTIDE: BNP: 848.7 pg/mL — ABNORMAL HIGH (ref 0.0–100.0)

## 2024-01-16 DIAGNOSIS — I5022 Chronic systolic (congestive) heart failure: Secondary | ICD-10-CM | POA: Diagnosis not present

## 2024-01-16 DIAGNOSIS — R5383 Other fatigue: Secondary | ICD-10-CM | POA: Diagnosis not present

## 2024-01-19 ENCOUNTER — Other Ambulatory Visit: Payer: Self-pay

## 2024-01-19 ENCOUNTER — Telehealth: Payer: Self-pay

## 2024-01-19 DIAGNOSIS — Z79899 Other long term (current) drug therapy: Secondary | ICD-10-CM

## 2024-01-19 DIAGNOSIS — Z9189 Other specified personal risk factors, not elsewhere classified: Secondary | ICD-10-CM

## 2024-01-19 MED ORDER — POTASSIUM CHLORIDE CRYS ER 20 MEQ PO TBCR: 20.0000 meq | EXTENDED_RELEASE_TABLET | Freq: Every day | ORAL | 3 refills | Status: DC

## 2024-01-19 MED ORDER — FUROSEMIDE 40 MG PO TABS: 40.0000 mg | ORAL_TABLET | Freq: Every day | ORAL | 3 refills | Status: DC

## 2024-01-19 NOTE — Addendum Note (Signed)
 Addended by: Lamar Benes on: 01/19/2024 05:21 PM   Modules accepted: Orders

## 2024-01-19 NOTE — Telephone Encounter (Signed)
 Spoke with pts daughter. She was notified of pts lab results and recommendations to increase Lasix 40 mg daily. Pts daughter is aware that pt will not take Lasix 3 times weekly. Pt will also take Potassium 20 mg daily. Medication sent to pts pharmacy. Lab order placed.

## 2024-02-11 ENCOUNTER — Other Ambulatory Visit: Payer: Medicare Other

## 2024-02-12 ENCOUNTER — Other Ambulatory Visit: Payer: Medicare Other

## 2024-02-28 ENCOUNTER — Inpatient Hospital Stay (HOSPITAL_COMMUNITY)
Admission: EM | Admit: 2024-02-28 | Discharge: 2024-03-14 | DRG: 291 | Disposition: E | Attending: Internal Medicine | Admitting: Internal Medicine

## 2024-02-28 ENCOUNTER — Inpatient Hospital Stay (HOSPITAL_COMMUNITY)

## 2024-02-28 ENCOUNTER — Emergency Department (HOSPITAL_COMMUNITY)

## 2024-02-28 ENCOUNTER — Other Ambulatory Visit: Payer: Self-pay

## 2024-02-28 ENCOUNTER — Encounter (HOSPITAL_COMMUNITY): Payer: Self-pay | Admitting: Emergency Medicine

## 2024-02-28 DIAGNOSIS — R627 Adult failure to thrive: Secondary | ICD-10-CM | POA: Diagnosis present

## 2024-02-28 DIAGNOSIS — I5043 Acute on chronic combined systolic (congestive) and diastolic (congestive) heart failure: Secondary | ICD-10-CM | POA: Diagnosis present

## 2024-02-28 DIAGNOSIS — I5042 Chronic combined systolic (congestive) and diastolic (congestive) heart failure: Secondary | ICD-10-CM | POA: Diagnosis present

## 2024-02-28 DIAGNOSIS — I3139 Other pericardial effusion (noninflammatory): Secondary | ICD-10-CM | POA: Diagnosis not present

## 2024-02-28 DIAGNOSIS — Z9012 Acquired absence of left breast and nipple: Secondary | ICD-10-CM

## 2024-02-28 DIAGNOSIS — Z79899 Other long term (current) drug therapy: Secondary | ICD-10-CM

## 2024-02-28 DIAGNOSIS — I10 Essential (primary) hypertension: Secondary | ICD-10-CM | POA: Diagnosis not present

## 2024-02-28 DIAGNOSIS — Z7409 Other reduced mobility: Secondary | ICD-10-CM | POA: Diagnosis present

## 2024-02-28 DIAGNOSIS — G9341 Metabolic encephalopathy: Secondary | ICD-10-CM | POA: Diagnosis not present

## 2024-02-28 DIAGNOSIS — Z66 Do not resuscitate: Secondary | ICD-10-CM | POA: Diagnosis present

## 2024-02-28 DIAGNOSIS — I5082 Biventricular heart failure: Secondary | ICD-10-CM | POA: Diagnosis present

## 2024-02-28 DIAGNOSIS — Z1152 Encounter for screening for COVID-19: Secondary | ICD-10-CM

## 2024-02-28 DIAGNOSIS — E049 Nontoxic goiter, unspecified: Secondary | ICD-10-CM | POA: Diagnosis not present

## 2024-02-28 DIAGNOSIS — E440 Moderate protein-calorie malnutrition: Secondary | ICD-10-CM | POA: Diagnosis not present

## 2024-02-28 DIAGNOSIS — R570 Cardiogenic shock: Secondary | ICD-10-CM | POA: Diagnosis present

## 2024-02-28 DIAGNOSIS — N1832 Chronic kidney disease, stage 3b: Secondary | ICD-10-CM | POA: Diagnosis not present

## 2024-02-28 DIAGNOSIS — R918 Other nonspecific abnormal finding of lung field: Secondary | ICD-10-CM | POA: Diagnosis not present

## 2024-02-28 DIAGNOSIS — I2781 Cor pulmonale (chronic): Secondary | ICD-10-CM | POA: Diagnosis present

## 2024-02-28 DIAGNOSIS — R0902 Hypoxemia: Secondary | ICD-10-CM | POA: Diagnosis not present

## 2024-02-28 DIAGNOSIS — Z87891 Personal history of nicotine dependence: Secondary | ICD-10-CM | POA: Diagnosis not present

## 2024-02-28 DIAGNOSIS — E11649 Type 2 diabetes mellitus with hypoglycemia without coma: Secondary | ICD-10-CM | POA: Diagnosis not present

## 2024-02-28 DIAGNOSIS — J189 Pneumonia, unspecified organism: Secondary | ICD-10-CM

## 2024-02-28 DIAGNOSIS — I4891 Unspecified atrial fibrillation: Secondary | ICD-10-CM

## 2024-02-28 DIAGNOSIS — F0390 Unspecified dementia without behavioral disturbance: Secondary | ICD-10-CM | POA: Diagnosis present

## 2024-02-28 DIAGNOSIS — I130 Hypertensive heart and chronic kidney disease with heart failure and stage 1 through stage 4 chronic kidney disease, or unspecified chronic kidney disease: Principal | ICD-10-CM | POA: Diagnosis present

## 2024-02-28 DIAGNOSIS — Z7189 Other specified counseling: Secondary | ICD-10-CM | POA: Diagnosis not present

## 2024-02-28 DIAGNOSIS — I480 Paroxysmal atrial fibrillation: Secondary | ICD-10-CM | POA: Diagnosis not present

## 2024-02-28 DIAGNOSIS — Z7901 Long term (current) use of anticoagulants: Secondary | ICD-10-CM

## 2024-02-28 DIAGNOSIS — Z853 Personal history of malignant neoplasm of breast: Secondary | ICD-10-CM

## 2024-02-28 DIAGNOSIS — I959 Hypotension, unspecified: Secondary | ICD-10-CM | POA: Diagnosis not present

## 2024-02-28 DIAGNOSIS — I82432 Acute embolism and thrombosis of left popliteal vein: Secondary | ICD-10-CM | POA: Diagnosis present

## 2024-02-28 DIAGNOSIS — I6381 Other cerebral infarction due to occlusion or stenosis of small artery: Secondary | ICD-10-CM | POA: Diagnosis not present

## 2024-02-28 DIAGNOSIS — R4182 Altered mental status, unspecified: Secondary | ICD-10-CM | POA: Diagnosis present

## 2024-02-28 DIAGNOSIS — J810 Acute pulmonary edema: Secondary | ICD-10-CM | POA: Diagnosis present

## 2024-02-28 DIAGNOSIS — J9811 Atelectasis: Secondary | ICD-10-CM | POA: Diagnosis not present

## 2024-02-28 DIAGNOSIS — I82502 Chronic embolism and thrombosis of unspecified deep veins of left lower extremity: Secondary | ICD-10-CM | POA: Diagnosis present

## 2024-02-28 DIAGNOSIS — Z515 Encounter for palliative care: Secondary | ICD-10-CM

## 2024-02-28 DIAGNOSIS — Z8249 Family history of ischemic heart disease and other diseases of the circulatory system: Secondary | ICD-10-CM | POA: Diagnosis not present

## 2024-02-28 DIAGNOSIS — N179 Acute kidney failure, unspecified: Secondary | ICD-10-CM | POA: Diagnosis not present

## 2024-02-28 DIAGNOSIS — R404 Transient alteration of awareness: Secondary | ICD-10-CM | POA: Diagnosis not present

## 2024-02-28 DIAGNOSIS — E872 Acidosis, unspecified: Secondary | ICD-10-CM | POA: Diagnosis present

## 2024-02-28 DIAGNOSIS — R0689 Other abnormalities of breathing: Secondary | ICD-10-CM | POA: Diagnosis not present

## 2024-02-28 DIAGNOSIS — R410 Disorientation, unspecified: Secondary | ICD-10-CM | POA: Diagnosis not present

## 2024-02-28 DIAGNOSIS — I6782 Cerebral ischemia: Secondary | ICD-10-CM | POA: Diagnosis not present

## 2024-02-28 DIAGNOSIS — G319 Degenerative disease of nervous system, unspecified: Secondary | ICD-10-CM | POA: Diagnosis not present

## 2024-02-28 DIAGNOSIS — Z789 Other specified health status: Secondary | ICD-10-CM | POA: Diagnosis not present

## 2024-02-28 DIAGNOSIS — E162 Hypoglycemia, unspecified: Secondary | ICD-10-CM | POA: Diagnosis not present

## 2024-02-28 DIAGNOSIS — R609 Edema, unspecified: Secondary | ICD-10-CM | POA: Diagnosis not present

## 2024-02-28 DIAGNOSIS — I493 Ventricular premature depolarization: Secondary | ICD-10-CM | POA: Diagnosis present

## 2024-02-28 DIAGNOSIS — Z90710 Acquired absence of both cervix and uterus: Secondary | ICD-10-CM

## 2024-02-28 DIAGNOSIS — J90 Pleural effusion, not elsewhere classified: Secondary | ICD-10-CM | POA: Diagnosis not present

## 2024-02-28 DIAGNOSIS — R000 Tachycardia, unspecified: Secondary | ICD-10-CM | POA: Diagnosis not present

## 2024-02-28 LAB — I-STAT VENOUS BLOOD GAS, ED
Acid-base deficit: 2 mmol/L (ref 0.0–2.0)
Bicarbonate: 22.9 mmol/L (ref 20.0–28.0)
Calcium, Ion: 0.96 mmol/L — ABNORMAL LOW (ref 1.15–1.40)
HCT: 44 % (ref 36.0–46.0)
Hemoglobin: 15 g/dL (ref 12.0–15.0)
O2 Saturation: 98 %
Potassium: 5.8 mmol/L — ABNORMAL HIGH (ref 3.5–5.1)
Sodium: 141 mmol/L (ref 135–145)
TCO2: 24 mmol/L (ref 22–32)
pCO2, Ven: 37.5 mmHg — ABNORMAL LOW (ref 44–60)
pH, Ven: 7.394 (ref 7.25–7.43)
pO2, Ven: 105 mmHg — ABNORMAL HIGH (ref 32–45)

## 2024-02-28 LAB — CBC WITH DIFFERENTIAL/PLATELET
Abs Immature Granulocytes: 0.03 10*3/uL (ref 0.00–0.07)
Basophils Absolute: 0 10*3/uL (ref 0.0–0.1)
Basophils Relative: 0 %
Eosinophils Absolute: 0.1 10*3/uL (ref 0.0–0.5)
Eosinophils Relative: 1 %
HCT: 40.3 % (ref 36.0–46.0)
Hemoglobin: 13 g/dL (ref 12.0–15.0)
Immature Granulocytes: 0 %
Lymphocytes Relative: 18 %
Lymphs Abs: 1.4 10*3/uL (ref 0.7–4.0)
MCH: 29.8 pg (ref 26.0–34.0)
MCHC: 32.3 g/dL (ref 30.0–36.0)
MCV: 92.4 fL (ref 80.0–100.0)
Monocytes Absolute: 0.8 10*3/uL (ref 0.1–1.0)
Monocytes Relative: 10 %
Neutro Abs: 5.4 10*3/uL (ref 1.7–7.7)
Neutrophils Relative %: 71 %
Platelets: 158 10*3/uL (ref 150–400)
RBC: 4.36 MIL/uL (ref 3.87–5.11)
RDW: 16.2 % — ABNORMAL HIGH (ref 11.5–15.5)
WBC: 7.7 10*3/uL (ref 4.0–10.5)
nRBC: 0 % (ref 0.0–0.2)

## 2024-02-28 LAB — I-STAT CG4 LACTIC ACID, ED: Lactic Acid, Venous: 5.1 mmol/L (ref 0.5–1.9)

## 2024-02-28 LAB — COMPREHENSIVE METABOLIC PANEL WITH GFR
ALT: 52 U/L — ABNORMAL HIGH (ref 0–44)
AST: 65 U/L — ABNORMAL HIGH (ref 15–41)
Albumin: 2.7 g/dL — ABNORMAL LOW (ref 3.5–5.0)
Alkaline Phosphatase: 70 U/L (ref 38–126)
Anion gap: 10 (ref 5–15)
BUN: 44 mg/dL — ABNORMAL HIGH (ref 8–23)
CO2: 22 mmol/L (ref 22–32)
Calcium: 8.2 mg/dL — ABNORMAL LOW (ref 8.9–10.3)
Chloride: 108 mmol/L (ref 98–111)
Creatinine, Ser: 1.66 mg/dL — ABNORMAL HIGH (ref 0.44–1.00)
GFR, Estimated: 29 mL/min — ABNORMAL LOW (ref >60)
Glucose, Bld: 284 mg/dL — ABNORMAL HIGH (ref 70–99)
Potassium: 4.6 mmol/L (ref 3.5–5.1)
Sodium: 140 mmol/L (ref 135–145)
Total Bilirubin: 0.7 mg/dL (ref 0.0–1.2)
Total Protein: 6.1 g/dL — ABNORMAL LOW (ref 6.5–8.1)

## 2024-02-28 LAB — LACTIC ACID, PLASMA: Lactic Acid, Venous: 5.4 mmol/L (ref 0.5–1.9)

## 2024-02-28 LAB — MAGNESIUM: Magnesium: 2.1 mg/dL (ref 1.7–2.4)

## 2024-02-28 LAB — RESP PANEL BY RT-PCR (RSV, FLU A&B, COVID)  RVPGX2
Influenza A by PCR: NEGATIVE
Influenza B by PCR: NEGATIVE
Resp Syncytial Virus by PCR: NEGATIVE
SARS Coronavirus 2 by RT PCR: NEGATIVE

## 2024-02-28 LAB — CBG MONITORING, ED
Glucose-Capillary: 260 mg/dL — ABNORMAL HIGH (ref 70–99)
Glucose-Capillary: 48 mg/dL — ABNORMAL LOW (ref 70–99)

## 2024-02-28 LAB — BRAIN NATRIURETIC PEPTIDE: B Natriuretic Peptide: 1073.6 pg/mL — ABNORMAL HIGH (ref 0.0–100.0)

## 2024-02-28 LAB — TROPONIN I (HIGH SENSITIVITY)
Troponin I (High Sensitivity): 93 ng/L — ABNORMAL HIGH (ref <18)
Troponin I (High Sensitivity): 85 ng/L — ABNORMAL HIGH (ref <18)

## 2024-02-28 MED ORDER — DEXTROSE 50 % IV SOLN
50.0000 mL | Freq: Once | INTRAVENOUS | Status: AC
  Administered 2024-02-28: 50 mL via INTRAVENOUS
  Filled 2024-02-28: qty 50

## 2024-02-28 MED ORDER — LORAZEPAM 2 MG/ML IJ SOLN
0.2500 mg | Freq: Once | INTRAMUSCULAR | Status: AC
  Administered 2024-02-28: 0.25 mg via INTRAVENOUS
  Filled 2024-02-28: qty 1

## 2024-02-28 MED ORDER — ACETAMINOPHEN 325 MG PO TABS: 650.0000 mg | ORAL_TABLET | Freq: Four times a day (QID) | ORAL | Status: DC | PRN

## 2024-02-28 MED ORDER — SODIUM CHLORIDE 0.9% FLUSH: 3.0000 mL | INTRAVENOUS | Status: DC | PRN

## 2024-02-28 MED ORDER — SODIUM CHLORIDE 0.9 % IV SOLN: INTRAVENOUS | Status: DC

## 2024-02-28 MED ORDER — SODIUM CHLORIDE 0.9 % IV BOLUS
500.0000 mL | Freq: Once | INTRAVENOUS | Status: AC
  Administered 2024-02-28: 500 mL via INTRAVENOUS

## 2024-02-28 MED ORDER — HYDRALAZINE HCL 20 MG/ML IJ SOLN
5.0000 mg | Freq: Three times a day (TID) | INTRAMUSCULAR | Status: DC | PRN
  Administered 2024-02-29: 5 mg via INTRAVENOUS
  Filled 2024-02-28: qty 1

## 2024-02-28 MED ORDER — SODIUM CHLORIDE 0.9% FLUSH
3.0000 mL | Freq: Two times a day (BID) | INTRAVENOUS | Status: DC
  Administered 2024-03-01: 3 mL via INTRAVENOUS

## 2024-02-28 MED ORDER — CEFTRIAXONE SODIUM 2 G IJ SOLR
2.0000 g | Freq: Once | INTRAMUSCULAR | Status: AC
  Administered 2024-02-28: 2 g via INTRAVENOUS
  Filled 2024-02-28: qty 20

## 2024-02-28 MED ORDER — ACETAMINOPHEN 650 MG RE SUPP: 650.0000 mg | Freq: Four times a day (QID) | RECTAL | Status: DC | PRN

## 2024-02-28 MED ORDER — HYDRALAZINE HCL 20 MG/ML IJ SOLN: 10.0000 mg | Freq: Three times a day (TID) | INTRAMUSCULAR | Status: DC | PRN

## 2024-02-28 MED ORDER — SODIUM CHLORIDE 0.9 % IV SOLN: 2.0000 g | INTRAVENOUS | Status: DC

## 2024-02-28 MED ORDER — VALPROATE SODIUM 100 MG/ML IV SOLN
250.0000 mg | Freq: Two times a day (BID) | INTRAVENOUS | Status: DC
  Administered 2024-02-29 (×2): 250 mg via INTRAVENOUS
  Filled 2024-02-28 (×4): qty 2.5

## 2024-02-28 MED ORDER — DEXTROSE-SODIUM CHLORIDE 10-0.45 % IV SOLN
INTRAVENOUS | Status: DC
  Filled 2024-02-28: qty 1000

## 2024-02-28 MED ORDER — CHLORHEXIDINE GLUCONATE CLOTH 2 % EX PADS
6.0000 | MEDICATED_PAD | Freq: Every day | CUTANEOUS | Status: DC
  Administered 2024-02-29: 6 via TOPICAL

## 2024-02-28 MED ORDER — MORPHINE SULFATE (PF) 2 MG/ML IV SOLN: 2.0000 mg | INTRAVENOUS | Status: DC | PRN

## 2024-02-28 MED ORDER — SODIUM CHLORIDE 0.9 % IV SOLN: 500.0000 mg | INTRAVENOUS | Status: DC

## 2024-02-28 MED ORDER — SODIUM CHLORIDE 0.9 % IV SOLN
500.0000 mg | Freq: Once | INTRAVENOUS | Status: AC
  Administered 2024-02-28: 500 mg via INTRAVENOUS
  Filled 2024-02-28: qty 5

## 2024-02-28 MED ORDER — HEPARIN SODIUM (PORCINE) 5000 UNIT/ML IJ SOLN
5000.0000 [IU] | Freq: Three times a day (TID) | INTRAMUSCULAR | Status: DC
  Administered 2024-02-29 (×2): 5000 [IU] via SUBCUTANEOUS
  Filled 2024-02-28 (×3): qty 1

## 2024-02-28 NOTE — ED Notes (Signed)
 Phlebotomy to collect blood work on patient

## 2024-02-28 NOTE — ED Notes (Signed)
Got patient into a gown on the monitor did EKG shown to Dr Karene Fry patient is resting with call bell in reach

## 2024-02-28 NOTE — H&P (Signed)
 History and Physical    Patient: Diane Ross WUJ:[id number] DOB: 18-Mar-1931 DOA: 02/28/2024 DOS: the patient was seen and examined on 02/28/2024 PCP: Arva Lathe, MD  Patient coming from: Home Chief complaint: Chief Complaint  Patient presents with   Hypotension   HPI:  Diane Ross is a 89 y.o. female with past medical history  of essential hypertension, HFrEF with an echocardiogram in 2022 in April showing an ejection fraction of 55% with abnormal biatrial dilation, please refer to complete report,, followed by cardiology last seen on January 14, 2024, history of breast cancer status postmastectomy history of syncope, Patient had monitor 3 to 14 days which showed frequent PACs and events of nonsustained atrial tachycardia with no evidence of A-fib.  Daughter at bedside states that she has got a history of paroxysmal atrial fibrillation.  Daughter [name] is Charity fundraiser and states that mom has been declining slowly with decreased mobility and poor p.o. intake.  ED Course: Pt in ed at bedside  is awake and restless nonfocal. Vital signs in the ED were notable for the following:  Vitals:   02/28/24 1813 02/28/24 2315 02/28/24 2328  BP: (!) 157/99 120/76   Pulse: (!) 111    Temp: 97.6 F (36.4 C)  97.8 F (36.6 C)  Resp: (!) 24 (!) 31   SpO2: 100%    TempSrc: Oral  Oral  >>ED evaluation thus far shows: - Venous blood gas shows pO2 of 105 pH of 7.3 pCO2 of 37.5. -CMP shows normal potassium of 4.6 glucose 284 AKI on CKD stage IIIb, mild transaminitis with AST of 65 and ALT of 52. -EKG today shows EKG today shows A-fib at 89, concern for previous anteroseptal infarct, QTc of 407, PVCs. - Normal CBC shows normal white count normal hemoglobin and normal platelets. -Respiratory panel ordered and pending. - Urinalysis ordered and pending.   -CT Head  today negative for any acute findings. -Chest x-ray done today shows moderate right pleural effusion with atelectasis, left retrocardiac  opacity.  >>While in the ED patient received the following: Medications  sodium chloride  0.9 % bolus 500 mL (0 mLs Intravenous Stopped 02/28/24 2053)    And  0.9 %  sodium chloride  infusion ( Intravenous New Bag/Given 02/28/24 2023)  azithromycin (ZITHROMAX) 500 mg in sodium chloride  0.9 % 250 mL IVPB (500 mg Intravenous New Bag/Given 02/28/24 2057)  dextrose 50 % solution 50 mL (50 mLs Intravenous Given 02/28/24 1851)  cefTRIAXone (ROCEPHIN) 2 g in sodium chloride  0.9 % 100 mL IVPB (0 g Intravenous Stopped 02/28/24 2053)  sodium chloride  0.9 % bolus 500 mL (0 mLs Intravenous Stopped 02/28/24 2055)   Review of Systems  Unable to perform ROS: Age   Past Medical History:  Diagnosis Date   Breast cancer (HCC)    left side   Hypertension    Thyroid  disorder    Past Surgical History:  Procedure Laterality Date   ABDOMINAL HYSTERECTOMY     BREAST LUMPECTOMY Left    ESOPHAGOGASTRODUODENOSCOPY N/A 03/29/2018   Procedure: ESOPHAGOGASTRODUODENOSCOPY (EGD);  Surgeon: Alvis Jourdain, MD;  Location: [facility] ENDOSCOPY;  Service: Endoscopy;  Laterality: N/A;    reports that she has quit smoking. Her smoking use included cigarettes. She has never used smokeless tobacco. She reports that she does not drink alcohol  and does not use drugs. No Known Allergies History reviewed. No pertinent family history. Prior to Admission medications   Medication Sig Start Date End Date Taking? Authorizing Provider  atorvastatin (LIPITOR) 20 MG tablet Take 20 mg  by mouth daily. 01/20/21   [provider]  carvedilol  (COREG ) 6.25 MG tablet Take 1 tablet (6.25 mg total) by mouth 2 (two) times daily with a meal. 01/02/18   Croitoru, Mihai, MD  divalproex (DEPAKOTE) 250 MG DR tablet Take 250 mg by mouth 2 (two) times daily. 01/23/22   [provider]  donepezil (ARICEPT) 10 MG tablet Take 10 mg by mouth daily. 03/29/21   [provider]  furosemide  (LASIX ) 40 MG tablet Take 1 tablet (40 mg total) by mouth  daily. 01/19/24 04/18/24  Jude Norton, NP  hydrALAZINE (APRESOLINE) 10 MG tablet Take 10 mg by mouth. 04/13/21   [provider]  mirtazapine (REMERON) 7.5 MG tablet Take 7.5 mg by mouth at bedtime. 02/25/21   [provider]  Multiple Vitamin (MULTIVITAMIN) tablet Take 1 tablet by mouth daily.    [provider]  potassium chloride  SA (KLOR-CON  M) 20 MEQ tablet Take 1 tablet (20 mEq total) by mouth daily. 01/19/24   Jude Norton, NP  pantoprazole  (PROTONIX ) 40 MG tablet Take 1 tablet by mouth once daily Patient not taking: Reported on 04/03/2021 06/07/19 04/04/21  Asencion Blacksmith, MD                                                                                 Vitals:   02/28/24 1813 02/28/24 2315 02/28/24 2328  BP: (!) 157/99 120/76   Pulse: (!) 111    Resp: (!) 24 (!) 31   Temp: 97.6 F (36.4 C)  97.8 F (36.6 C)  TempSrc: Oral  Oral  SpO2: 100%     Physical Exam Constitutional:      General: She is not in acute distress. HENT:     Head: Atraumatic.     Right Ear: External ear normal.     Left Ear: External ear normal.  Cardiovascular:     Rate and Rhythm: Tachycardia present. Rhythm irregular.     Heart sounds: Normal heart sounds.  Pulmonary:     Effort: Pulmonary effort is normal.     Breath sounds: Rales present.  Abdominal:     General: There is no distension.     Palpations: Abdomen is soft.     Tenderness: There is no abdominal tenderness.  Neurological:     Mental Status: She is alert.  Psychiatric:        Mood and Affect: Mood is anxious.        Behavior: Behavior is slowed.     Comments: Pt is awake and speak to daughter in few words. Is abel to move her extremities spontaneously but exam is limited. Restless intermittently.     Labs on Admission: I have personally reviewed following labs and imaging studies CBC: Recent Labs  Lab 02/28/24 1844 02/28/24 1923  WBC  --  7.7  NEUTROABS  --  5.4  HGB 15.0 13.0  HCT 44.0 40.3  MCV   --  92.4  PLT  --  158   Basic Metabolic Panel: Recent Labs  Lab 02/28/24 1844 02/28/24 1923  NA 141 140  K 5.8* 4.6  CL  --  108  CO2  --  22  GLUCOSE  --  284*  BUN  --  44*  CREATININE  --  1.66*  CALCIUM  --  8.2*   GFR: CrCl cannot be calculated (Unknown ideal weight.). Liver Function Tests: Recent Labs  Lab 02/28/24 1923  AST 65*  ALT 52*  ALKPHOS 70  BILITOT 0.7  PROT 6.1*  ALBUMIN 2.7*   No results for input(s): "LIPASE", "AMYLASE" in the last 168 hours. No results for input(s): "AMMONIA" in the last 168 hours. Coagulation Profile: No results for input(s): "INR", "PROTIME" in the last 168 hours. Cardiac Enzymes: No results for input(s): "CKTOTAL", "CKMB", "CKMBINDEX", "TROPONINI" in the last 168 hours. BNP (last 3 results) No results for input(s): "PROBNP" in the last 8760 hours. HbA1C: No results for input(s): "HGBA1C" in the last 72 hours. CBG: Recent Labs  Lab 02/28/24 1846 02/28/24 1922  GLUCAP 48* 260*    Urine analysis: Ordered /Pending.   Radiological Exams on Admission: CT HEAD WO CONTRAST Result Date: 02/28/2024 CLINICAL DATA:  Altered mental status EXAM: CT HEAD WITHOUT CONTRAST TECHNIQUE: Contiguous axial images were obtained from the base of the skull through the vertex without intravenous contrast. RADIATION DOSE REDUCTION: This exam was performed according to the departmental dose-optimization program which includes automated exposure control, adjustment of the mA and/or kV according to patient size and/or use of iterative reconstruction technique. COMPARISON:  04/03/2021 FINDINGS: Brain: No evidence of acute infarction, hemorrhage, hydrocephalus, extra-axial collection or mass lesion/mass effect. Chronic atrophic changes and chronic white matter ischemic changes are noted. Lacunar infarct is noted in the basal ganglia on the left. Vascular: No hyperdense vessel or unexpected calcification. Skull: Normal. Negative for fracture or focal  lesion. Sinuses/Orbits: No acute finding. Other: None. IMPRESSION: Chronic atrophic and ischemic changes without acute abnormality. Electronically Signed   By: Violeta Grey M.D.   On: 02/28/2024 19:39   DG Chest Port 1 View Result Date: 02/28/2024 CLINICAL DATA:  Altered mental status EXAM: PORTABLE CHEST 1 VIEW COMPARISON:  April 03, 2021 FINDINGS: The cardiomediastinal silhouette is enlarged in contour. Moderate RIGHT pleural effusion. No pneumothorax. RIGHT basilar margin is opacity. LEFT retrocardiac opacity. IMPRESSION: 1. Moderate RIGHT pleural effusion with possible associated atelectasis. Recommend follow-up radiograph after resolution of pleural fluid. 2. LEFT retrocardiac opacity, favored atelectasis. Electronically Signed   By: Clancy Crimes M.D.   On: 02/28/2024 18:46   Data Reviewed: Relevant notes from primary care and specialist visits, past discharge summaries as available in EHR, including Care Everywhere. Prior diagnostic testing as pertinent to current admission diagnoses, Updated medications and problem lists for reconciliation ED course, including vitals, labs, imaging, treatment and response to treatment,Triage notes, nursing and pharmacy notes and ED provider's notes Notable results as noted in HPI.Discussed case with EDMD/ ED APP/ or Specialty MD on call and as needed.  Assessment & Plan  >>AMS: Patient presenting with altered mental status generalized weakness failure to thrive per description of daughter.  At baseline mom is usually able to carry a conversation and ambulate with her walker but daughter states that recently has been requiring help with ADLs and her diet has been very little. Differentials include metabolic encephalopathy patient was also noted to be hypoglycemic with a glucose of 48, TIA, pulmonary embolism. Head CT is negative, will obtain an MRI of the brain noncontrast patient is at high risk for stroke with history of A-fib.  >> Pneumonia: Based on  chest x-ray will obtain a procalcitonin, proceed with CT chest noncontrast.  2D echocardiogram as patient also  has an effusion and bilateral lower extremity edema. Continue to treat the patient with broad-spectrum antibiotic coverage.  Will do a bedside swallow evaluation and a neurological evaluation to identify , 20 acquired pneumonia or possible stroke related swallowing difficulty and aspiration pneumonia.  Patient is currently stable on room air and not requiring oxygen.SpO2: 100 % patient was given Rocephin and azithromycin.   >> A-fib: Currently patient is in A-fib on EKG and is on Coreg  3.125.  No AC in chart.  CHA2DS2/VAS Stroke Risk Points  Current as of 11 minutes ago     5 >= 2 Points: High Risk  1 to 1.99 Points: Medium Risk  0 Points: Low Risk    No Change   Per daughter at the she does not know of any reason why mom is not on any anticoagulation any GI bleed history etc.Suspect that she has had FOBT positive chart in past. CBC normal. Currently daughter agrees with heparin if we need for VTE treatment of an kind.   >>AKI: Lab Results  Component Value Date   CREATININE 1.66 (H) 02/28/2024   CREATININE 1.30 (H) 01/14/2024   CREATININE 1.16 (H) 01/24/2022  2/2 to decreased po intake and or hypotension or sepsis.  Hold BP meds. Pt given .  Intake/Output Summary (Last 24 hours) at 02/28/2024 2335 Last data filed at 02/28/2024 2332 Gross per 24 hour  Intake 368.46 ml  Output --  Net 368.46 ml     >>Transaminitis:    Latest Ref Rng & Units 02/28/2024    7:23 PM 01/24/2022    8:54 AM 04/03/2021    2:34 AM  Hepatic Function  Total Protein 6.5 - 8.1 g/dL 6.1  7.0  7.5   Albumin 3.5 - 5.0 g/dL 2.7  3.8  3.2   AST 15 - 41 U/L 65  22  18   ALT 0 - 44 U/L 52  10  11   Alk Phosphatase 38 - 126 U/L 70  70  77   Total Bilirubin 0.0 - 1.2 mg/dL 0.7  0.2  0.5   Suspect from volume issue rather with elevated bnp would be passive hepatic congestion or with sepsis from pneumonia.     >>Hypoglycemia: D10 at 10-20 cc/hour.   sodium chloride  125 mL/hr at 02/28/24 2332   azithromycin     cefTRIAXone (ROCEPHIN)  IV     dextrose 10 % and 0.45 % NaCl     valproate sodium      >> Seizure disorder: Aspiration fall and seizure precautions. Will continue patient on Depakote IV.   >> History of GI bleed:    Latest Ref Rng & Units 02/28/2024    7:23 PM 02/28/2024    6:44 PM 01/14/2024    3:47 PM  CBC  WBC 4.0 - 10.5 K/uL 7.7   6.5   Hemoglobin 12.0 - 15.0 g/dL 78.2  95.6  21.3   Hematocrit 36.0 - 46.0 % 40.3  44.0  38.6   Platelets 150 - 400 K/uL 158   232   Upon chart review I have found that in the patient's problem list she has a history of GI bleed duodenal ulcer acute blood loss anemia which is probably the reason patient is not on any anticoagulation.   >> Chronic combined systolic and diastolic congestive heart failure: Patient has bilateral edema, basilar crackles but is not on oxygen is in no distress.Bl le venous doppler.  Cautious diuresis as needed with close monitoring of blood pressure and  electrolytes and kidney function. 2D echocardiogram as deemed appropriate.     >> Lactic acidosis: Lactic acid of 5.1 and repeat is pending.  Suspect may be related to hypoperfusion or hypotension or sepsis related.  Will follow.    >> Hypoglycemia: Patient started on D10 normal saline at 20 cc an hour continuous.   DVT prophylaxis:  Heparin  Consults:  None  Advance Care Planning:    Code Status: Limited: Do not attempt resuscitation (DNR) -DNR-LIMITED -Do Not Intubate/DNI    Family Communication:  Daughter [name].   Disposition Plan:  TBD> Severity of Illness: The appropriate patient status for this patient is INPATIENT. Inpatient status is judged to be reasonable and necessary in order to provide the required intensity of service to ensure the patient's safety. The patient's presenting symptoms, physical exam findings, and initial radiographic and  laboratory data in the context of their chronic comorbidities is felt to place them at high risk for further clinical deterioration. Furthermore, it is not anticipated that the patient will be medically stable for discharge from the hospital within 2 midnights of admission.   * I certify that at the point of admission it is my clinical judgment that the patient will require inpatient hospital care spanning beyond 2 midnights from the point of admission due to high intensity of service, high risk for further deterioration and high frequency of surveillance required.*  Unresulted Labs (From admission, onward)     Start     Ordered   02/29/24 0500  Comprehensive metabolic panel  Tomorrow morning,   R        02/28/24 2258   02/29/24 0500  CBC  Tomorrow morning,   R        02/28/24 2258   02/28/24 2305  Valproic acid level  Once,   R        02/28/24 2305   02/28/24 2219  Magnesium  Add-on,   AD        02/28/24 2218   02/28/24 2215  Lactic acid, plasma  (Lactic Acid)  STAT Now then every 3 hours,   R (with STAT occurrences)      02/28/24 2214   02/28/24 2121  Procalcitonin  Add-on,   AD       References:    Procalcitonin Lower Respiratory Tract Infection AND Sepsis Procalcitonin Algorithm   02/28/24 2120   02/28/24 2121  D-dimer, quantitative  ONCE - STAT,   STAT        02/28/24 2120   02/28/24 1925  Blood culture (routine x 2)  BLOOD CULTURE X 2,   R      02/28/24 1924   02/28/24 1821  Urinalysis, Routine w reflex microscopic -Urine, Clean Catch  Once,   URGENT       Question:  Specimen Source  Answer:  Urine, Clean Catch   02/28/24 1820            Orders Placed This Encounter  Procedures   Critical Care   Blood culture (routine x 2)   Resp panel by RT-PCR (RSV, Flu A&B, Covid) Anterior Nasal Swab   CT HEAD WO CONTRAST   DG Chest Port 1 View   MR BRAIN WO CONTRAST   Comprehensive metabolic panel   CBC with Differential/Platelet   Urinalysis, Routine w reflex microscopic -Urine,  Clean Catch   Brain natriuretic peptide   Procalcitonin   D-dimer, quantitative   Lactic acid, plasma   Magnesium   Comprehensive metabolic panel  CBC   Valproic acid level   Diet NPO time specified   ED Cardiac monitoring   Initiate Carrier Fluid Protocol   Document height and weight   Assess and Document Glasgow Coma Scale   Document vital signs within 1-hour of fluid bolus completion. Notify provider of abnormal vital signs despite fluid resuscitation.   DO NOT delay antibiotics if unable to obtain blood culture.   Refer to Sidebar Report: Sepsis Sidebar ED/IP   Notify provider for difficulties obtaining IV access.   Insert peripheral IV x 2   Initiate Carrier Fluid Protocol   Strict intake and output   Maintain IV access   Vital signs   Notify physician (specify)   Mobility Protocol: No Restrictions RN to initiate protocols based on patient's level of care   Refer to Sidebar Report Refer to ICU, Med-Surg, Progressive, and Step-Down Mobility Protocol Sidebars   Initiate Adult Central Line Maintenance and Catheter Protocol for patients with central line (CVC, PICC, Port, Hemodialysis, Trialysis)   Daily weights   Intake and Output   Do not place and if present remove PureWick   Initiate Oral Care Protocol   Initiate Carrier Fluid Protocol   RN may order General Admission PRN Orders utilizing "General Admission PRN medications" (through manage orders) for the following patient needs: allergy symptoms (Claritin), cold sores (Carmex), cough (Robitussin DM), eye irritation (Liquifilm Tears), hemorrhoids (Tucks), indigestion (Maalox), minor skin irritation (Hydrocortisone Cream), muscle pain Lovena Rubinstein Gay), nose irritation (saline nasal spray) and sore throat (Chloraseptic spray).   Cardiac Monitoring - Continuous Indefinite   Bed rest   Vital signs   Insert urethral catheter If Coude Catheter is chosen, qualified resources by campus can be found in the clinical skills nursing procedure  for Coude Catheter, And on Amion under Urology.  If catheter irrigation is needed consider large-bore cathete...   Refer to Sidebar Report Urinary (Foley) Catheter Indications   Refer to Sidebar Report Post Indwelling Urinary Catheter Removal and Intervention Guidelines   Swallow screen   Do not attempt resuscitation (DNR)- Limited -Do Not Intubate (DNI)   Code Sepsis activation.  This occurs automatically when order is signed and prioritizes pharmacy, lab, and radiology services for STAT collections and interventions.  If [facility] downtime, call [facility] [phone]) to activate Code Sepsis.   monitoring by pharmacy   Consult for [facility] Admission   ED Pulse oximetry, continuous   Pulse oximetry check with vital signs   Oxygen therapy Mode or (Route): Nasal cannula; Liters Per Minute: 2; Keep O2 saturation between: greater than 92 %   CBG monitoring, ED   I-Stat Lactic Acid, ED   I-Stat venous blood gas, ED   CBG monitoring, ED   I-Stat CG4 Lactic Acid   EKG 12-Lead   ECHOCARDIOGRAM COMPLETE   Insert peripheral IV   Admit to Inpatient (patient's expected length of stay will be greater than 2 midnights or inpatient only procedure)   Aspiration precautions   Fall precautions   Seizure precautions   Skin care precautions   VAS US  LOWER EXTREMITY VENOUS (DVT)    Author: Lavanda Porter, MD 12 pm -8 pm. 02/28/2024 11:35 PM >>Please note for any concern,or critical results after hours past 8pm please contact the [facility] hospitalist [facility] floor coverage provider from 7 PM- 7 AM. For on call review [email or web address], username [email or web address] and PW: your phone number<<

## 2024-02-28 NOTE — ED Triage Notes (Signed)
 Pt BIB [facility] from home due to hypotension and failure to thrive.  Pt daughter is a geriatric nurse and states mother has been declining for weeks now.  Pt normally can have conversation and she it not doing so at this time. Hx afib. 20g left forearm.  250ml NS. VS BP 104/70, HR 90 CBG 130

## 2024-02-28 NOTE — ED Notes (Signed)
 Critical Lactic Acid 5.4

## 2024-02-28 NOTE — ED Provider Notes (Signed)
 [place] EMERGENCY DEPARTMENT AT [facility] Provider Note   CSN: [id number] Arrival date & time: 02/28/24  1805     History  Chief Complaint  Patient presents with   Hypotension    Diane Ross is a 89 y.o. female.  HPI   89 year old female presenting to the emergency department with concern for failure to thrive and hypotension at home.  The patient's daughter is a geriatric nurse and had provided history to EMS and is stated that the patient's mother has been declining for the past few weeks.  Can normally have a conversation and she is not doing so at this time.  She has a history of atrial fibrillation, is not on anticoagulation.  She is reportedly a DNR.  History limited by the patient's mental status.  CBG notably 48 on arrival.  The patient was administered D50 on arrival.  No family members immediately present on patient arrival to obtain further HPI.  Home Medications Prior to Admission medications   Medication Sig Start Date End Date Taking? Authorizing Provider  carvedilol  (COREG ) 3.125 MG tablet Take 3.125 mg by mouth daily. 01/24/24  Yes [provider]  atorvastatin (LIPITOR) 20 MG tablet Take 20 mg by mouth daily. 01/20/21   [provider]  carvedilol  (COREG ) 6.25 MG tablet Take 1 tablet (6.25 mg total) by mouth 2 (two) times daily with a meal. 01/02/18   Croitoru, Mihai, MD  divalproex (DEPAKOTE) 250 MG DR tablet Take 250 mg by mouth 2 (two) times daily. 01/23/22   [provider]  donepezil (ARICEPT) 10 MG tablet Take 10 mg by mouth daily. 03/29/21   [provider]  furosemide  (LASIX ) 40 MG tablet Take 1 tablet (40 mg total) by mouth daily. 01/19/24 04/18/24  Jude Norton, NP  hydrALAZINE (APRESOLINE) 10 MG tablet Take 10 mg by mouth. 04/13/21   [provider]  mirtazapine (REMERON) 7.5 MG tablet Take 7.5 mg by mouth at bedtime. 02/25/21   [provider]  Multiple Vitamin (MULTIVITAMIN) tablet Take 1 tablet  by mouth daily.    [provider]  potassium chloride  SA (KLOR-CON  M) 20 MEQ tablet Take 1 tablet (20 mEq total) by mouth daily. 01/19/24   Jude Norton, NP  pantoprazole  (PROTONIX ) 40 MG tablet Take 1 tablet by mouth once daily Patient not taking: Reported on 04/03/2021 06/07/19 04/04/21  Asencion Blacksmith, MD      Allergies    Patient has no known allergies.    Review of Systems   Review of Systems  Unable to perform ROS: Dementia    Physical Exam Updated Vital Signs BP (!) 157/99 (BP Location: Right Arm)   Pulse (!) 111   Temp 97.6 F (36.4 C) (Oral)   Resp (!) 24   SpO2 100%  Physical Exam Vitals and nursing note reviewed.  Constitutional:      General: She is not in acute distress.    Appearance: She is well-developed.  HENT:     Head: Normocephalic and atraumatic.  Eyes:     Conjunctiva/sclera: Conjunctivae normal.  Cardiovascular:     Rate and Rhythm: Tachycardia present. Rhythm irregular.     Pulses: Normal pulses.  Pulmonary:     Effort: Pulmonary effort is normal. Tachypnea present. No respiratory distress.     Breath sounds: Normal breath sounds.  Abdominal:     Palpations: Abdomen is soft.     Tenderness: There is no abdominal tenderness.  Musculoskeletal:  General: No swelling.     Cervical back: Neck supple.  Skin:    General: Skin is warm and dry.     Capillary Refill: Capillary refill takes less than 2 seconds.  Neurological:     Mental Status: She is alert.     GCS: GCS eye subscore is 4. GCS verbal subscore is 4. GCS motor subscore is 6.     Cranial Nerves: Cranial nerves 2-12 are intact.     Sensory: Sensation is intact.     Motor: Motor function is intact.     Comments: Disoriented, GCS 14, moving all 4 extremities with no focal deficits  Psychiatric:        Mood and Affect: Mood normal.     ED Results / Procedures / Treatments   Labs (all labs ordered are listed, but only abnormal results are displayed) Labs Reviewed   COMPREHENSIVE METABOLIC PANEL WITH GFR - Abnormal; Notable for the following components:      Result Value   Glucose, Bld 284 (*)    BUN 44 (*)    Creatinine, Ser 1.66 (*)    Calcium 8.2 (*)    Total Protein 6.1 (*)    Albumin 2.7 (*)    AST 65 (*)    ALT 52 (*)    GFR, Estimated 29 (*)    All other components within normal limits  CBC WITH DIFFERENTIAL/PLATELET - Abnormal; Notable for the following components:   RDW 16.2 (*)    All other components within normal limits  BRAIN NATRIURETIC PEPTIDE - Abnormal; Notable for the following components:   B Natriuretic Peptide 1,073.6 (*)    All other components within normal limits  CBG MONITORING, ED - Abnormal; Notable for the following components:   Glucose-Capillary 48 (*)    All other components within normal limits  I-STAT CG4 LACTIC ACID, ED - Abnormal; Notable for the following components:   Lactic Acid, Venous 5.1 (*)    All other components within normal limits  I-STAT VENOUS BLOOD GAS, ED - Abnormal; Notable for the following components:   pCO2, Ven 37.5 (*)    pO2, Ven 105 (*)    Potassium 5.8 (*)    Calcium, Ion 0.96 (*)    All other components within normal limits  CBG MONITORING, ED - Abnormal; Notable for the following components:   Glucose-Capillary 260 (*)    All other components within normal limits  TROPONIN I (HIGH SENSITIVITY) - Abnormal; Notable for the following components:   Troponin I (High Sensitivity) 93 (*)    All other components within normal limits  CULTURE, BLOOD (ROUTINE X 2)  CULTURE, BLOOD (ROUTINE X 2)  RESP PANEL BY RT-PCR (RSV, FLU A&B, COVID)  RVPGX2  URINALYSIS, ROUTINE W REFLEX MICROSCOPIC  PROCALCITONIN  D-DIMER, QUANTITATIVE  I-STAT CG4 LACTIC ACID, ED  I-STAT CG4 LACTIC ACID, ED  TROPONIN I (HIGH SENSITIVITY)    EKG EKG Interpretation Date/Time:  Saturday Feb 28 2024 18:16:33 EDT Ventricular Rate:  89 PR Interval:    QRS Duration:  81 QT Interval:  334 QTC  Calculation: 407 R Axis:   78  Text Interpretation: Atrial fibrillation Ventricular premature complex Anteroseptal infarct, old Nonspecific repol abnormality, diffuse leads Confirmed by Rosealee Concha ([id number]) on 02/28/2024 6:49:53 PM  Radiology CT HEAD WO CONTRAST Result Date: 02/28/2024 CLINICAL DATA:  Altered mental status EXAM: CT HEAD WITHOUT CONTRAST TECHNIQUE: Contiguous axial images were obtained from the base of the skull through the vertex without intravenous contrast. RADIATION  DOSE REDUCTION: This exam was performed according to the departmental dose-optimization program which includes automated exposure control, adjustment of the mA and/or kV according to patient size and/or use of iterative reconstruction technique. COMPARISON:  04/03/2021 FINDINGS: Brain: No evidence of acute infarction, hemorrhage, hydrocephalus, extra-axial collection or mass lesion/mass effect. Chronic atrophic changes and chronic white matter ischemic changes are noted. Lacunar infarct is noted in the basal ganglia on the left. Vascular: No hyperdense vessel or unexpected calcification. Skull: Normal. Negative for fracture or focal lesion. Sinuses/Orbits: No acute finding. Other: None. IMPRESSION: Chronic atrophic and ischemic changes without acute abnormality. Electronically Signed   By: Violeta Grey M.D.   On: 02/28/2024 19:39   DG Chest Port 1 View Result Date: 02/28/2024 CLINICAL DATA:  Altered mental status EXAM: PORTABLE CHEST 1 VIEW COMPARISON:  April 03, 2021 FINDINGS: The cardiomediastinal silhouette is enlarged in contour. Moderate RIGHT pleural effusion. No pneumothorax. RIGHT basilar margin is opacity. LEFT retrocardiac opacity. IMPRESSION: 1. Moderate RIGHT pleural effusion with possible associated atelectasis. Recommend follow-up radiograph after resolution of pleural fluid. 2. LEFT retrocardiac opacity, favored atelectasis. Electronically Signed   By: Clancy Crimes M.D.   On: 02/28/2024 18:46     Procedures .Critical Care  Performed by: Rosealee Concha, MD Authorized by: Rosealee Concha, MD   Critical care provider statement:    Critical care time (minutes):  30   Critical care was necessary to treat or prevent imminent or life-threatening deterioration of the following conditions:  Sepsis   Critical care was time spent personally by me on the following activities:  Development of treatment plan with patient or surrogate, discussions with consultants, evaluation of patient's response to treatment, examination of patient, ordering and review of laboratory studies, ordering and review of radiographic studies, ordering and performing treatments and interventions, pulse oximetry, re-evaluation of patient's condition and review of old charts     Medications Ordered in ED Medications  sodium chloride  0.9 % bolus 500 mL (0 mLs Intravenous Stopped 02/28/24 2053)    And  0.9 %  sodium chloride  infusion ( Intravenous New Bag/Given 02/28/24 2023)  azithromycin (ZITHROMAX) 500 mg in sodium chloride  0.9 % 250 mL IVPB (500 mg Intravenous New Bag/Given 02/28/24 2057)  dextrose 50 % solution 50 mL (50 mLs Intravenous Given 02/28/24 1851)  cefTRIAXone (ROCEPHIN) 2 g in sodium chloride  0.9 % 100 mL IVPB (0 g Intravenous Stopped 02/28/24 2053)  sodium chloride  0.9 % bolus 500 mL (0 mLs Intravenous Stopped 02/28/24 2055)    ED Course/ Medical Decision Making/ A&P Clinical Course as of 02/28/24 2146  Sat Feb 28, 2024  1849 Lactic Acid, Venous(!!): 5.1 [JL]  1849 Glucose-Capillary(!): 48 [JL]  1925 Pulse Rate(!): 111 [JL]  1925 Resp(!): 24 [JL]    Clinical Course User Index [JL] Rosealee Concha, MD                                 Medical Decision Making Amount and/or Complexity of Data Reviewed Labs: ordered. Decision-making details documented in ED Course. Radiology: ordered.  Risk Prescription drug management. Decision regarding hospitalization.    89 year old female presenting to the  emergency department with concern for failure to thrive and hypotension at home.  The patient's daughter is a geriatric nurse and had provided history to EMS and is stated that the patient's mother has been declining for the past few weeks.  Can normally have a conversation and she is not doing  so at this time.  She has a history of atrial fibrillation, is not on anticoagulation.  She is reportedly a DNR.  History limited by the patient's mental status.  CBG notably 48 on arrival.  The patient was administered D50 on arrival.  No family members immediately present on patient arrival to obtain further HPI.  On arrival, the patient was afebrile, tachycardic, heart rate 111, irregularly irregular heart rate noted on exam and confirmed on cardiac telemetry and EKG with concern for atrial fibrillation with RVR.  BP was notably 157/99, the patient was also notably notably tachypneic respiratory rate 24, saturating 100% on room air.  Concern for atrial fibrillation with RVR, pulmonary edema, pneumonia, viral infection, PE, CVA, other toxic metabolic derangement, infectious etiology.  Concern for developing sepsis with tachycardia and tachypnea.  Septic workup initiated to include blood cultures, urinalysis and urine cultures.  CBC was without a leukocytosis or anemia, CMP showed an AKI on CKD with a creatinine of 1.66 from baseline around 1.3, elevated LFTs with an AST of 65, ALT of 52.  Lactic acid was elevated at 5.1.  The patient was initially hypoglycemic with a CBG of 48, was administered D50 with improvement to 284.  A VBG was without an acidosis or alkalosis, BNP was elevated to thousand 74 and a cardiac troponin was elevated to 93.  Patient given renal function cannot undergo CT imaging to evaluate for PE.  A chest x-ray showed consolidative process on my read, potential atelectasis on radiology read however my concern is for pneumonia and infectious etiology.  Administered IV Rocephin and azithromycin in  addition to fluid resuscitation given the patient's lactic acidosis.  Last echocardiogram in 2022 revealed an EF of 55% with some diastolic dysfunction.  Patient without complaint at this time, intermittently in A-fib with RVR, given the concern for potential infectious etiology, urinalysis pending, repeat cardiac troponin pending, will not initiate rate control measures, fluid resuscitation was administered and patient remained hemodynamically stable in the emergency department.  Daughter updated regarding the plan for admission, hospitalist medicine consulted for admission.   Final Clinical Impression(s) / ED Diagnoses Final diagnoses:  Hypoglycemia  Altered mental status, unspecified altered mental status type  Community acquired pneumonia, unspecified laterality  Atrial fibrillation with RVR (HCC)  Lactic acidosis  AKI (acute kidney injury) [facility])    Rx / DC Orders ED Discharge Orders     None         Rosealee Concha, MD 02/28/24 2146

## 2024-02-28 NOTE — Sepsis Progress Note (Signed)
 Elink following code sepsis

## 2024-02-28 NOTE — ED Notes (Signed)
 Checked patient cbg it was 30 notified  RN of blood sugar

## 2024-02-29 ENCOUNTER — Inpatient Hospital Stay (HOSPITAL_COMMUNITY)

## 2024-02-29 DIAGNOSIS — R410 Disorientation, unspecified: Secondary | ICD-10-CM | POA: Diagnosis not present

## 2024-02-29 DIAGNOSIS — N1832 Chronic kidney disease, stage 3b: Secondary | ICD-10-CM

## 2024-02-29 DIAGNOSIS — I5042 Chronic combined systolic (congestive) and diastolic (congestive) heart failure: Secondary | ICD-10-CM | POA: Diagnosis not present

## 2024-02-29 DIAGNOSIS — N179 Acute kidney failure, unspecified: Secondary | ICD-10-CM

## 2024-02-29 DIAGNOSIS — I4891 Unspecified atrial fibrillation: Secondary | ICD-10-CM

## 2024-02-29 DIAGNOSIS — E440 Moderate protein-calorie malnutrition: Secondary | ICD-10-CM

## 2024-02-29 DIAGNOSIS — I10 Essential (primary) hypertension: Secondary | ICD-10-CM

## 2024-02-29 LAB — URINALYSIS, ROUTINE W REFLEX MICROSCOPIC
Bilirubin Urine: NEGATIVE
Glucose, UA: NEGATIVE mg/dL
Hgb urine dipstick: NEGATIVE
Ketones, ur: NEGATIVE mg/dL
Nitrite: NEGATIVE
Protein, ur: 100 mg/dL — AB
Specific Gravity, Urine: 1.019 (ref 1.005–1.030)
pH: 5 (ref 5.0–8.0)

## 2024-02-29 LAB — CBC
HCT: 40.7 % (ref 36.0–46.0)
Hemoglobin: 13.3 g/dL (ref 12.0–15.0)
MCH: 29.5 pg (ref 26.0–34.0)
MCHC: 32.7 g/dL (ref 30.0–36.0)
MCV: 90.2 fL (ref 80.0–100.0)
Platelets: 156 10*3/uL (ref 150–400)
RBC: 4.51 MIL/uL (ref 3.87–5.11)
RDW: 16.2 % — ABNORMAL HIGH (ref 11.5–15.5)
WBC: 8.2 10*3/uL (ref 4.0–10.5)
nRBC: 0.4 % — ABNORMAL HIGH (ref 0.0–0.2)

## 2024-02-29 LAB — COMPREHENSIVE METABOLIC PANEL WITH GFR
ALT: 62 U/L — ABNORMAL HIGH (ref 0–44)
AST: 78 U/L — ABNORMAL HIGH (ref 15–41)
Albumin: 2.6 g/dL — ABNORMAL LOW (ref 3.5–5.0)
Alkaline Phosphatase: 71 U/L (ref 38–126)
Anion gap: 11 (ref 5–15)
BUN: 40 mg/dL — ABNORMAL HIGH (ref 8–23)
CO2: 18 mmol/L — ABNORMAL LOW (ref 22–32)
Calcium: 7.7 mg/dL — ABNORMAL LOW (ref 8.9–10.3)
Chloride: 113 mmol/L — ABNORMAL HIGH (ref 98–111)
Creatinine, Ser: 1.54 mg/dL — ABNORMAL HIGH (ref 0.44–1.00)
GFR, Estimated: 32 mL/min — ABNORMAL LOW (ref >60)
Glucose, Bld: 111 mg/dL — ABNORMAL HIGH (ref 70–99)
Potassium: 4.5 mmol/L (ref 3.5–5.1)
Sodium: 142 mmol/L (ref 135–145)
Total Bilirubin: 0.8 mg/dL (ref 0.0–1.2)
Total Protein: 5.9 g/dL — ABNORMAL LOW (ref 6.5–8.1)

## 2024-02-29 LAB — ECHOCARDIOGRAM COMPLETE
P 1/2 time: 616 ms
S' Lateral: 3.8 cm
Weight: 2299.84 [oz_av]

## 2024-02-29 LAB — LACTIC ACID, PLASMA: Lactic Acid, Venous: 1.9 mmol/L (ref 0.5–1.9)

## 2024-02-29 LAB — MRSA NEXT GEN BY PCR, NASAL: MRSA by PCR Next Gen: NOT DETECTED

## 2024-02-29 LAB — TROPONIN I (HIGH SENSITIVITY): Troponin I (High Sensitivity): 88 ng/L — ABNORMAL HIGH (ref <18)

## 2024-02-29 LAB — VALPROIC ACID LEVEL: Valproic Acid Lvl: 28 ug/mL — ABNORMAL LOW (ref 50–100)

## 2024-02-29 LAB — PROCALCITONIN: Procalcitonin: <0.1 ng/mL

## 2024-02-29 LAB — D-DIMER, QUANTITATIVE: D-Dimer, Quant: >20 ug{FEU}/mL — ABNORMAL HIGH (ref 0.00–0.50)

## 2024-02-29 MED ORDER — DILTIAZEM HCL-DEXTROSE 125-5 MG/125ML-% IV SOLN (PREMIX): 5.0000 mg/h | INTRAVENOUS | Status: DC

## 2024-02-29 MED ORDER — DIVALPROEX SODIUM 250 MG PO DR TAB
250.0000 mg | DELAYED_RELEASE_TABLET | Freq: Two times a day (BID) | ORAL | Status: DC
Start: 2024-02-29 — End: 2024-03-01
  Administered 2024-02-29 (×2): 250 mg via ORAL
  Filled 2024-02-29 (×4): qty 1

## 2024-02-29 MED ORDER — MELATONIN 5 MG PO TABS
5.0000 mg | ORAL_TABLET | Freq: Every evening | ORAL | Status: DC | PRN
  Administered 2024-02-29: 5 mg via ORAL
  Filled 2024-02-29: qty 1

## 2024-02-29 MED ORDER — AMIODARONE LOAD VIA INFUSION
150.0000 mg | Freq: Once | INTRAVENOUS | Status: AC
  Administered 2024-02-29: 150 mg via INTRAVENOUS
  Filled 2024-02-29: qty 83.34

## 2024-02-29 MED ORDER — ATORVASTATIN CALCIUM 10 MG PO TABS
20.0000 mg | ORAL_TABLET | Freq: Every day | ORAL | Status: DC
  Administered 2024-02-29 – 2024-03-01 (×2): 20 mg via ORAL
  Filled 2024-02-29 (×2): qty 2

## 2024-02-29 MED ORDER — HALOPERIDOL LACTATE 5 MG/ML IJ SOLN: 0.5000 mg | Freq: Once | INTRAMUSCULAR | Status: DC

## 2024-02-29 MED ORDER — APIXABAN 5 MG PO TABS
5.0000 mg | ORAL_TABLET | Freq: Two times a day (BID) | ORAL | Status: DC
  Administered 2024-02-29 (×2): 5 mg via ORAL
  Filled 2024-02-29: qty 2
  Filled 2024-02-29 (×2): qty 1

## 2024-02-29 MED ORDER — SODIUM CHLORIDE 0.9 % IV BOLUS
500.0000 mL | Freq: Once | INTRAVENOUS | Status: AC
  Administered 2024-02-29: 500 mL via INTRAVENOUS

## 2024-02-29 MED ORDER — AMIODARONE HCL IN DEXTROSE 360-4.14 MG/200ML-% IV SOLN
60.0000 mg/h | INTRAVENOUS | Status: AC
  Administered 2024-02-29 (×2): 60 mg/h via INTRAVENOUS
  Filled 2024-02-29 (×2): qty 200

## 2024-02-29 MED ORDER — DONEPEZIL HCL 10 MG PO TABS
10.0000 mg | ORAL_TABLET | Freq: Every day | ORAL | Status: DC
  Administered 2024-02-29: 10 mg via ORAL
  Filled 2024-02-29: qty 1

## 2024-02-29 MED ORDER — DILTIAZEM LOAD VIA INFUSION
5.0000 mg | Freq: Once | INTRAVENOUS | Status: DC
  Filled 2024-02-29 (×2): qty 5

## 2024-02-29 MED ORDER — MIRTAZAPINE 7.5 MG PO TABS
7.5000 mg | ORAL_TABLET | Freq: Every day | ORAL | Status: DC
  Administered 2024-02-29 – 2024-03-01 (×2): 7.5 mg via ORAL
  Filled 2024-02-29 (×2): qty 1

## 2024-02-29 MED ORDER — PANTOPRAZOLE SODIUM 40 MG PO TBEC
40.0000 mg | DELAYED_RELEASE_TABLET | Freq: Every day | ORAL | Status: DC
  Administered 2024-02-29: 40 mg via ORAL
  Filled 2024-02-29 (×3): qty 1

## 2024-02-29 MED ORDER — ONE-DAILY MULTI VITAMINS PO TABS: 1.0000 | ORAL_TABLET | Freq: Every day | ORAL | Status: DC

## 2024-02-29 MED ORDER — FUROSEMIDE 10 MG/ML IJ SOLN
60.0000 mg | Freq: Two times a day (BID) | INTRAMUSCULAR | Status: DC
  Administered 2024-02-29 – 2024-03-01 (×2): 60 mg via INTRAVENOUS
  Filled 2024-02-29 (×2): qty 6

## 2024-02-29 MED ORDER — ADULT MULTIVITAMIN W/MINERALS CH
1.0000 | ORAL_TABLET | Freq: Every day | ORAL | Status: DC
  Administered 2024-03-02: 1 via ORAL
  Filled 2024-02-29 (×3): qty 1

## 2024-02-29 MED ORDER — AMIODARONE HCL IN DEXTROSE 360-4.14 MG/200ML-% IV SOLN
30.0000 mg/h | INTRAVENOUS | Status: DC
  Administered 2024-02-29 – 2024-03-01 (×3): 30 mg/h via INTRAVENOUS
  Filled 2024-02-29 (×3): qty 200

## 2024-02-29 NOTE — Assessment & Plan Note (Addendum)
 Patient with persistent hypotension.  Continue comfort care.

## 2024-02-29 NOTE — Progress Notes (Addendum)
 Progress Note   Patient: Diane Ross IEP:[id number] DOB: 03-11-1931 DOA: 02/28/2024     1 DOS: the patient was seen and examined on 02/29/2024   Brief hospital course: Diane Ross was admitted to the hospital with the working diagnosis of acute metabolic encephalopathy in the setting or pneumonia.   89 yo female with the past medical history of hypertension, heart failure, and history of breast cancer who presented with failure to thrive. Per her daughter, patient's health has been rapidly declining over last several weeks prior to hospitalization. Decreased mobility, decreased po intake and less interactive. EMS was called. She was found hypotensive, received 250 ml IV NS and was transported to the ED.  On her initial physical examination her blood pressure was 157/99, HR 111, R 24 and 02 saturation 100% Lungs with bilateral rales, heart with S1 and S2 present and irregular with no gallops, abdomen with no distention and non tender and no lower extremity edema.  Patient poorly interactive and slow to respond.   Na 140, K 4,6 CL 108 bicarbonate 22, glucose 284, bun 44 cr 1,64  AST 65 ALT 52  BNP 1,073  High sensitive troponin 93, 85 and 88  Lactic acid 5,4 and 1,9  Wbc 7,7 hgb 13.0 plt 158  Sars covid 19 negative Influenza negative RSV negative   CT head with chronic atrophic and ischemic changes without acute abnormalities.  MRI brain with no evidence of acute intracranial abnormalities. Remote left basal ganglia perforator infarct.  Cerebral atrophy.   Chest radiograph with right rotation, positive cardiomegaly with bilateral hilar vascular congestion, with right pleural effusion with right basal atelectasis.   CT chest with bilateral pleural effusions, moderate on the right and small on the left, bibasilar compression atelectasis, enlarged pulmonary arteries.   EKG 89 bpm, normal axis, normal intervals, qtc 407, atrial fibrillation rhythm with no significant ST segment, negative  T wave V4 to V6. Positive PVC.   Assessment and Plan: * Chronic combined systolic and diastolic congestive heart failure (HCC) 2022 echocardiogram with preserved LV systolic function.  Today echocardiogram pending report, per my interpretation LV systolic function seems to be low.   Positive volume overload.   Plan to discontinue IV fluids  Start IV diuresis with furosemide  60 mg IV bid Limited therapy due to acutely reduced GFR and hypotension  Close blood pressure monitoring   Acute cardiogenic pulmonary edema with bilateral pleural effusions.  Continue diuresis with furosemide .  No clinical signs of pneumonia, will hold on antibiotic therapy (pneumonia ruled out).  New onset atrial fibrillation (HCC) Patient with RVR, apparently now with low LV systolic function.   Will add amiodarone drip for rate control and start anticoagulation with apixaban.  Follow up on echocardiogram (will consult cardiology with results). Continue telemetry monitoring.   Old records personally reviewed 2019 she has duodenal ulcer.  Risk vs benefit will continue anticoagulation with close follow up .  Add pantoprazole .   Acute kidney injury superimposed on stage 3b chronic kidney disease (HCC) Renal function with serum cr at 1,54 with K at 4.5 and serum bicarbonate at 18  Na 142 Mg 2.1   Plan to continue diuresis with furosemide  and follow up renal function in am.  Avoid hypotension and nephrotoxic medications.   AMS (altered mental status) Probably she has cognitive dysfunction at baseline.  Will continue supportive medical therapy   Continue mirtazapine, divalproex and donepezil.   Essential hypertension Continue blood pressure monitoring  Holding on carvedilol .   Malnutrition of moderate  degree Continue nutritional support. Follow up with nutrition consultation    Subjective: Patient is awake and alert, she has been confused and disorientated. Mittens in place to protect IV she denies  chest pain, positive dyspnea. Not details due to cognitive impairment.   Physical Exam: Vitals:   02/29/24 0600 02/29/24 0630 02/29/24 0721 02/29/24 1127  BP: (!) 125/108  (!) 140/116 (!) 106/90  Pulse: 78  (!) 108 (!) 112  Resp: (!) 22     Temp: 97.6 F (36.4 C)  97.7 F (36.5 C) 98.1 F (36.7 C)  TempSrc: Oral  Oral Oral  SpO2: 95%   95%  Weight:  65.2 kg     Neurology awake and alert, deconditioned and ill looking appearing. Disoriented and confused but not agitated. Bilateral mittens to protect IV  ENT with mild pallor with no icterus Cardiovascular with S1 and S2 present, irregularly irregular with no gallops or rubs, positive systolic murmur at the apex Positive moderate JVD Positive lower extremity edema ++ Respiratory with bilateral rales with no wheezing or rhonchi, poor inspiratory effort.  Abdomen with no distention. Non tender Data Reviewed:    Family Communication: no family at the bedside  I spoke with patient's daughter over the phone, we talked in detail about patient's condition, plan of care and prognosis and all questions were addressed.   Disposition: Status is: Inpatient Remains inpatient appropriate because: IV diltiazem   Planned Discharge Destination: Home     Author: Albertus Alt, MD 02/29/2024 12:23 PM  For on call review [email or web address].

## 2024-02-29 NOTE — Assessment & Plan Note (Addendum)
 Echocardiogram with worsening LV systolic function with EF 20 to 25%, global hypokinesis, RV systolic function with severe reduction, RVSP 64. mmHg, LA and RA with severe dilatation, small pericardial effusion, mild mitral valve regurgitation, moderate tricuspid valve regurgitation, mild to moderate aortic regurgitation.   Acute on chronic core pulmonale.  Pulmonary hypertension  RV failure   Patient with very poor prognosis, she has not responded to initial therapy with amiodarone  and aggressive IV furosemide .  She has developed cardiogenic shock  Patient not candidate for advanced therapies, poor prognosis,  Decision was made to transition to comfort care.   Acute cardiogenic pulmonary edema with bilateral pleural effusions.  Continue diuresis with furosemide .  No clinical signs of pneumonia, pneumonia was been ruled out. Supplemental 02 per [place] for comfort.

## 2024-02-29 NOTE — Assessment & Plan Note (Addendum)
 Continue comfort measures.

## 2024-02-29 NOTE — Evaluation (Signed)
 Clinical/Bedside Swallow Evaluation Patient Details  Name: Diane Ross MRN: [id number] Date of Birth: [birth year]  Today's Date: 02/29/2024 Time: SLP Start Time (ACUTE ONLY): 1455 SLP Stop Time (ACUTE ONLY): 1510 SLP Time Calculation (min) (ACUTE ONLY): 15 min  Past Medical History:  Past Medical History:  Diagnosis Date   Breast cancer (HCC)    left side   Hypertension    Thyroid  disorder    Past Surgical History:  Past Surgical History:  Procedure Laterality Date   ABDOMINAL HYSTERECTOMY     BREAST LUMPECTOMY Left    ESOPHAGOGASTRODUODENOSCOPY N/A 03/29/2018   Procedure: ESOPHAGOGASTRODUODENOSCOPY (EGD);  Surgeon: Alvis Jourdain, MD;  Location: [facility] ENDOSCOPY;  Service: Endoscopy;  Laterality: N/A;   HPI:  Patient is a 89 y.o. female with PMH: essential HTN, HFrEF, h/o breast cancer s/p postmastectomy, syncope. Daughter reported that patient's health has been rapidly declining over the last several weeks with decreased mobility, decreased PO intake and less interactive. On 02/28/24, EMS was called and she was found to be hypotensive and was transported to ED. CT head negative for acute abnormalities, CT chest showed bilateral pleural effusions, bibasilar atelectasis. She was admitted with chronic combined systolic and diastolic CHF, AKI, AMS.    Assessment / Plan / Recommendation  Clinical Impression  Patient presents with a suspected cognitive-based dysphagia as per this bedside swallow evaluation. She was awake, alert and cooperative throughout evaluation but did require cues to initiate PO intake. SLP assessed her swallow with PO's of thin liquids (water), puree solids (applesauce) and regular solids (graham cracker). She was able to feed self with setup assistance. No overt s/s aspiration observed and swallow initiation appeared fairly timely. With solids, she exhibited mildly prolonged mastication and although she did exhibit mild amount of residuals in oral cavity s/p swallow, these  residuals cleared with sip of liquids. SLP recommending advance to Dys 2 (minced) solids, continue with thin liquids. SLP will follow briefly for toleration and ability to advance. SLP Visit Diagnosis: Dysphagia, unspecified (R13.10)    Aspiration Risk  Mild aspiration risk    Diet Recommendation Dysphagia 2 (Fine chop);Thin liquid    Liquid Administration via: Straw;Cup Medication Administration: Crushed with puree Supervision: Patient able to self feed;Full supervision/cueing for compensatory strategies Compensations: Slow rate;Small sips/bites Postural Changes: Seated upright at 90 degrees    Other  Recommendations Oral Care Recommendations: Oral care BID    Recommendations for follow up therapy are one component of a multi-disciplinary discharge planning process, led by the attending physician.  Recommendations may be updated based on patient status, additional functional criteria and insurance authorization.  Follow up Recommendations No SLP follow up      Assistance Recommended at Discharge    Functional Status Assessment Patient has had a recent decline in their functional status and demonstrates the ability to make significant improvements in function in a reasonable and predictable amount of time.  Frequency and Duration min 1 x/week  1 week       Prognosis Prognosis for improved oropharyngeal function: Good Barriers to Reach Goals: Cognitive deficits      Swallow Study   General Date of Onset: 02/29/24 HPI: Patient is a 89 y.o. female with PMH: essential HTN, HFrEF, h/o breast cancer s/p postmastectomy, syncope. Daughter reported that patient's health has been rapidly declining over the last several weeks with decreased mobility, decreased PO intake and less interactive. On 02/28/24, EMS was called and she was found to be hypotensive and was transported to ED. CT head negative for  acute abnormalities, CT chest showed bilateral pleural effusions, bibasilar atelectasis. She  was admitted with chronic combined systolic and diastolic CHF, AKI, AMS. Type of Study: Bedside Swallow Evaluation Previous Swallow Assessment: none found Diet Prior to this Study: Dysphagia 1 (pureed);Thin liquids (Level 0) Temperature Spikes Noted: No Respiratory Status: Room air History of Recent Intubation: No Behavior/Cognition: Alert;Cooperative;Requires cueing Oral Cavity Assessment: Within Functional Limits Oral Care Completed by SLP: No Oral Cavity - Dentition: Adequate natural dentition Self-Feeding Abilities: Able to feed self;Needs set up Patient Positioning: Upright in bed Baseline Vocal Quality: Normal Volitional Cough: Cognitively unable to elicit Volitional Swallow: Unable to elicit    Oral/Motor/Sensory Function Overall Oral Motor/Sensory Function: Within functional limits   Ice Chips     Thin Liquid Thin Liquid: Within functional limits Presentation: Straw    Nectar Thick     Honey Thick     Puree Puree: Within functional limits Presentation: Spoon   Solid     Solid: Impaired Oral Phase Functional Implications: Impaired mastication;Prolonged oral transit      Jacqualine Mater, MA, CCC-SLP Speech Therapy

## 2024-02-29 NOTE — Assessment & Plan Note (Addendum)
 Worsening renal function.  Continue with comfort care.

## 2024-02-29 NOTE — Assessment & Plan Note (Deleted)
 f

## 2024-02-29 NOTE — Hospital Course (Addendum)
 Mrs. Normington was admitted to the hospital with the working diagnosis of acute metabolic encephalopathy in the setting or pneumonia.   89 yo female with the past medical history of hypertension, heart failure, and history of breast cancer who presented with failure to thrive. Per her daughter, patient's health has been rapidly declining over last several weeks prior to hospitalization. Decreased mobility, decreased po intake and less interactive. EMS was called. She was found hypotensive, received 250 ml IV NS and was transported to the ED.  On her initial physical examination her blood pressure was 157/99, HR 111, R 24 and 02 saturation 100% Lungs with bilateral rales, heart with S1 and S2 present and irregular with no gallops, abdomen with no distention and non tender and no lower extremity edema.  Patient poorly interactive and slow to respond.   Na 140, K 4,6 CL 108 bicarbonate 22, glucose 284, bun 44 cr 1,64  AST 65 ALT 52  BNP 1,073  High sensitive troponin 93, 85 and 88  Lactic acid 5,4 and 1,9  Wbc 7,7 hgb 13.0 plt 158  Sars covid 19 negative Influenza negative RSV negative   CT head with chronic atrophic and ischemic changes without acute abnormalities.  MRI brain with no evidence of acute intracranial abnormalities. Remote left basal ganglia perforator infarct.  Cerebral atrophy.   Chest radiograph with right rotation, positive cardiomegaly with bilateral hilar vascular congestion, with right pleural effusion with right basal atelectasis.   CT chest with bilateral pleural effusions, moderate on the right and small on the left, bibasilar compression atelectasis, enlarged pulmonary arteries.   EKG 89 bpm, normal axis, normal intervals, qtc 407, atrial fibrillation rhythm with no significant ST segment, negative T wave V4 to V6. Positive PVC.   Patient was placed on IV furosemide  and IV amiodarone .  05/19 patient converted to sinus rhythm, continue volume overloaded. Developed  cardiogenic shock. Considering poor prognosis and biventricular failure, decision was made to transition to comfort care.  05/20 Comfort care measures, imminent death.

## 2024-02-29 NOTE — Assessment & Plan Note (Addendum)
 Comfort care.  Discontinue psychotropic medications

## 2024-02-29 NOTE — ED Notes (Signed)
 Patient transported to CT

## 2024-02-29 NOTE — Progress Notes (Signed)
  Echocardiogram 2D Echocardiogram has been performed.  Diane Ross 02/29/2024, 12:24 PM

## 2024-02-29 NOTE — Plan of Care (Signed)

## 2024-02-29 NOTE — ED Notes (Signed)
 Patient transported to MRI

## 2024-02-29 NOTE — Assessment & Plan Note (Addendum)
 Patient continue sinus rhythm, holding amiodarone . Continue comfort measures.

## 2024-03-01 ENCOUNTER — Inpatient Hospital Stay (HOSPITAL_COMMUNITY)

## 2024-03-01 DIAGNOSIS — N179 Acute kidney failure, unspecified: Secondary | ICD-10-CM | POA: Diagnosis not present

## 2024-03-01 DIAGNOSIS — R410 Disorientation, unspecified: Secondary | ICD-10-CM | POA: Diagnosis not present

## 2024-03-01 DIAGNOSIS — I4891 Unspecified atrial fibrillation: Secondary | ICD-10-CM | POA: Diagnosis not present

## 2024-03-01 DIAGNOSIS — I5042 Chronic combined systolic (congestive) and diastolic (congestive) heart failure: Secondary | ICD-10-CM | POA: Diagnosis not present

## 2024-03-01 DIAGNOSIS — R570 Cardiogenic shock: Secondary | ICD-10-CM | POA: Diagnosis not present

## 2024-03-01 LAB — BASIC METABOLIC PANEL WITH GFR
Anion gap: 13 (ref 5–15)
BUN: 47 mg/dL — ABNORMAL HIGH (ref 8–23)
CO2: 19 mmol/L — ABNORMAL LOW (ref 22–32)
Calcium: 8.5 mg/dL — ABNORMAL LOW (ref 8.9–10.3)
Chloride: 107 mmol/L (ref 98–111)
Creatinine, Ser: 2.11 mg/dL — ABNORMAL HIGH (ref 0.44–1.00)
GFR, Estimated: 22 mL/min — ABNORMAL LOW (ref >60)
Glucose, Bld: 83 mg/dL (ref 70–99)
Potassium: 4.7 mmol/L (ref 3.5–5.1)
Sodium: 139 mmol/L (ref 135–145)

## 2024-03-01 LAB — MAGNESIUM: Magnesium: 2.2 mg/dL (ref 1.7–2.4)

## 2024-03-01 MED ORDER — FUROSEMIDE 10 MG/ML IJ SOLN: 60.0000 mg | Freq: Two times a day (BID) | INTRAMUSCULAR | Status: DC

## 2024-03-01 MED ORDER — TECHNETIUM TO 99M ALBUMIN AGGREGATED
4.3000 | Freq: Once | INTRAVENOUS | Status: AC | PRN
  Administered 2024-03-01: 4.3 via INTRAVENOUS

## 2024-03-01 MED ORDER — DIVALPROEX SODIUM 250 MG PO DR TAB
250.0000 mg | DELAYED_RELEASE_TABLET | Freq: Two times a day (BID) | ORAL | Status: DC
  Administered 2024-03-01 – 2024-03-02 (×2): 250 mg via ORAL
  Filled 2024-03-01 (×2): qty 1

## 2024-03-01 MED ORDER — APIXABAN 2.5 MG PO TABS
2.5000 mg | ORAL_TABLET | Freq: Two times a day (BID) | ORAL | Status: DC
  Administered 2024-03-01 – 2024-03-02 (×2): 2.5 mg via ORAL
  Filled 2024-03-01 (×2): qty 1

## 2024-03-01 MED ORDER — FUROSEMIDE 10 MG/ML IJ SOLN
80.0000 mg | Freq: Two times a day (BID) | INTRAMUSCULAR | Status: DC
  Administered 2024-03-01 (×2): 80 mg via INTRAVENOUS
  Filled 2024-03-01 (×3): qty 8

## 2024-03-01 NOTE — Consult Note (Addendum)
 Cardiology Consultation   Patient ID: Brooklynne Pereida MRN: [id number]; DOB: 1930/12/28  Admit date: 02/28/2024 Date of Consult: 03/01/2024  PCP:  Arva Lathe, MD   [facility] Providers Cardiologist:  Luana Rumple, MD      Patient Profile:   Serinity Ware is a 89 y.o. female with a hx of heart failure with improved EF, HTN, history of breast cancer s/p mastectomy, PACs, recurrent syncope who is being seen 03/01/2024 for the evaluation of CHF, afib at the request of Dr. Sunnie England.  History of Present Illness:   Ms. Spieth is a 89 year old female with above medical history who has been followed by Dr. Alvis Ba.  She previously had a nuclear stress test in 11/24/17 that showed EF 45%, no perfusion defects identified.  Echocardiogram in 01/2018 showed EF 50-55%, grade 1 diastolic dysfunction, mild mitral regurgitation.  Later, repeat echocardiogram in 01/2021 showed EF 55%, no regional wall motion abnormalities, grade 1 DD, normal RV systolic function, normal pulmonary artery systolic pressure.  Cardiac monitor in 02/2021 showed very frequent premature atrial complexes, frequent episodes of nonsustained atrial tachycardia.  No evidence of atrial fibrillation. In the past, it was felt that her episodes of syncope were related to transient hypotension.   Patient was last seen by cardiology on 01/14/24. At that time, patient patient was stable from a cardiac standpoint. She reported occasional shortness of breath. Denied chest pain, palpitations, dizziness, PND, orthopnea, weight gain. With her shortness of breath, a repeat echo was offered. However, patient reported that she would not be interested in pursuing invasive procedures/further testing. Decided to manage conservatively.   Patient presented to the ED on 5/17 due to concerns of hypotension and failure to thrive.  Patient's daughter is a Estate agent, reported her mother had been declining for weeks.  In the ED, CMP showed  normal potassium of 4.6, creatinine elevated to 1.66.  CBC with normal white blood cell count, normal hemoglobin, normal platelets.  BNP elevated to 1073.6. Lactic acid 5.1>5.4>1.9.  Head CT negative for acute findings.  Chest x-ray showed moderate right pleural effusion, left retrocardiac opacity.  EKG showed atrial fibrillation with heart rate 89 bpm.  CT chest showed moderate right and small left pleural effusions, global cardiomegaly with biatrial enlargement, enlarged central pulmonary arteries in keeping changes of pulmonary arterial hypertension.  She was admitted to the internal medicine service for CHF, atrial fibrillation. Started on IV lasix . She was started on eliquis  and amiodarone  drip for atrial fibrillation.  Converted to NSR on 5/18. She underwent echocardiogram 5/19 that showed EF 20-25%, severely reduced RV systolic function, severely elevated PA systolic pressure, severe biatrial enlargement, small pericardial effusion, mild MR.   Past Medical History:  Diagnosis Date   Breast cancer (HCC)    left side   Hypertension    Thyroid  disorder     Past Surgical History:  Procedure Laterality Date   ABDOMINAL HYSTERECTOMY     BREAST LUMPECTOMY Left    ESOPHAGOGASTRODUODENOSCOPY N/A 03/29/2018   Procedure: ESOPHAGOGASTRODUODENOSCOPY (EGD);  Surgeon: Alvis Jourdain, MD;  Location: [facility] ENDOSCOPY;  Service: Endoscopy;  Laterality: N/A;     Home Medications:  Prior to Admission medications   Medication Sig Start Date End Date Taking? Authorizing Provider  atorvastatin  (LIPITOR) 20 MG tablet Take 20 mg by mouth at bedtime. 01/20/21  Yes [provider]  carvedilol  (COREG ) 3.125 MG tablet Take 3.125 mg by mouth daily. 01/24/24  Yes [provider]  divalproex  (DEPAKOTE ) 250 MG DR tablet Take 250  mg by mouth 2 (two) times daily. 01/23/22  Yes [provider]  donepezil  (ARICEPT ) 10 MG tablet Take 10 mg by mouth at bedtime. 03/29/21  Yes [provider]   furosemide  (LASIX ) 40 MG tablet Take 1 tablet (40 mg total) by mouth daily. 01/19/24 04/18/24 Yes Monge, Nelva Bang, NP  hydrALAZINE  (APRESOLINE ) 10 MG tablet Take 10 mg by mouth in the morning and at bedtime. 04/13/21  Yes [provider]  mirtazapine  (REMERON ) 7.5 MG tablet Take 7.5 mg by mouth at bedtime. 02/25/21  Yes [provider]  Multiple Vitamin (MULTIVITAMIN) tablet Take 1 tablet by mouth daily.   Yes [provider]  potassium chloride  SA (KLOR-CON  M) 20 MEQ tablet Take 1 tablet (20 mEq total) by mouth daily. 01/19/24  Yes Jude Norton, NP  pantoprazole  (PROTONIX ) 40 MG tablet Take 1 tablet by mouth once daily Patient not taking: Reported on 04/03/2021 06/07/19 04/04/21  Asencion Blacksmith, MD    Inpatient Medications: Scheduled Meds:  apixaban   2.5 mg Oral BID   atorvastatin   20 mg Oral QHS   divalproex   250 mg Oral BID   furosemide   80 mg Intravenous BID   mirtazapine   7.5 mg Oral QHS   multivitamin with minerals  1 tablet Oral Daily   pantoprazole   40 mg Oral Daily   sodium chloride  flush  3 mL Intravenous Q12H   sodium chloride  flush  3-10 mL Intravenous Q12H   Continuous Infusions:  amiodarone  30 mg/hr (03/01/24 0919)   PRN Meds: acetaminophen  **OR** acetaminophen , hydrALAZINE , sodium chloride  flush  Allergies:   No Known Allergies  Social History:   Social History   Socioeconomic History   Marital status: Widowed    Spouse name: Not on file   Number of children: Not on file   Years of education: Not on file   Highest education level: Not on file  Occupational History   Occupation: retired  Tobacco Use   Smoking status: Former    Types: Cigarettes   Smokeless tobacco: Never  Vaping Use   Vaping status: Never Used  Substance and Sexual Activity   Alcohol  use: No   Drug use: No   Sexual activity: Never  Other Topics Concern   Not on file  Social History Narrative   Not on file   Social Drivers of Health   Financial Resource Strain:  Low Risk  (05/15/2021)   Received from [facility]   Overall Financial Resource Strain (CARDIA)    Difficulty of Paying Living Expenses: Not very hard  Food Insecurity: Not on file  Transportation Needs: No Transportation Needs (05/14/2021)   Received from [facility] - Transportation    Lack of Transportation (Medical): No    Lack of Transportation (Non-Medical): No  Physical Activity: Not on file  Stress: No Stress Concern Present (05/13/2021)   Received from [facility] - Occupational Stress Questionnaire    Feeling of Stress : Not at all  Social Connections: Unknown (02/26/2022)   Received from [facility]   Social Network    Social Network: Not on file  Intimate Partner Violence: Unknown (01/18/2022)   Received from [facility]   HITS    Physically Hurt: Not on file    Insult or Talk Down To: Not on file    Threaten Physical Harm: Not on file    Scream or Curse: Not on file    Family History:   History  reviewed. No pertinent family history.   ROS:  Please see the history of present illness.   All other ROS reviewed and negative.     Physical Exam/Data:   Vitals:   03/01/24 0735 03/01/24 1019 03/01/24 1136 03/01/24 1137  BP: 110/82  98/65 98/65  Pulse:   61   Resp: 17  13 15   Temp: 97.7 F (36.5 C)  97.8 F (36.6 C)   TempSrc: Oral  Oral   SpO2: 96%  96% 95%  Weight:      Height:  5\' 8"  (1.727 m)      Intake/Output Summary (Last 24 hours) at 03/01/2024 1554 Last data filed at 03/01/2024 0500 Gross per 24 hour  Intake 545.13 ml  Output --  Net 545.13 ml      03/01/2024    4:34 AM 02/29/2024    6:30 AM 01/14/2024    2:54 PM  Last 3 Weights  Weight (lbs) 152 lb 1.9 oz 143 lb 11.8 oz 138 lb 9.6 oz  Weight (kg) 69 kg 65.2 kg 62.869 kg     Body mass index is 23.13 kg/m.  General: Elderly female. Laying upright in the bed. Somnolent, difficult to arouse  HEENT: normal Neck: no JVD Vascular: Radial  pulses 2+ bilaterally Cardiac:  normal S1, S2; RRR, no murmurs  Lungs:  Diminished breath sounds in lung bases. Normal WOB on 2 L via Andrews  Abd: soft, nontender Ext: 1+ edema in BLE  Musculoskeletal:  No deformities Skin: warm and dry  Neuro:  CNs 2-12 intact, no focal abnormalities noted Psych:  Normal affect   EKG:  The EKG was personally reviewed and demonstrates:  EKG 5/17 showed atrial fibrillation, HR 89 BPM  Telemetry:  Telemetry was personally reviewed and demonstrates:  Patient converted to NSR yesterday evening at 1820   Relevant CV Studies: Cardiac Studies & Procedures   ______________________________________________________________________________________________   STRESS TESTS  MYOCARDIAL PERFUSION IMAGING 11/24/2017  Narrative  Nuclear stress EF: 45%. Mild diffuse hypokinesis  There was no ST segment deviation noted during stress.  The study is normal. No perfusion defects identified. Dilated LV.  This is an intermediate risk study based upon mildly reduced left ventricular ejection fraction. Non-ischemic cardiomyopathy.  Dorothye Gathers, MD   ECHOCARDIOGRAM  ECHOCARDIOGRAM COMPLETE 02/29/2024  Narrative ECHOCARDIOGRAM REPORT    Patient Name:   ELIYA BUBAR Date of Exam: 02/29/2024 Medical Rec #:  [id number]     Height:       63.0 in Accession #:    [id number]    Weight:       143.7 lb Date of Birth:  September 06, 1931    BSA:          1.680 m Patient Age:    89 years      BP:           140/116 mmHg Patient Gender: F             HR:           117 bpm. Exam Location:  Inpatient  Procedure: 2D Echo (Both Spectral and Color Flow Doppler were utilized during procedure).  Indications:    atrial fibrillation  History:        Patient has prior history of Echocardiogram examinations, most recent 01/29/2021. Signs/Symptoms:Altered Mental Status; Risk Factors:Dyslipidemia and Hypertension.  Sonographer:    Dione Franks RDCS Referring Phys: [phone] EKTA V  PATEL  IMPRESSIONS   1. Challenging study in the setting of tachycardia. 2. Left ventricular ejection  fraction, by estimation, is 20 to 25%. The left ventricle has severely decreased function. The left ventricle demonstrates global hypokinesis. Left ventricular diastolic function could not be evaluated. 3. Right ventricular systolic function is severely reduced. The right ventricular size is normal. There is severely elevated pulmonary artery systolic pressure. The estimated right ventricular systolic pressure is 64.0 mmHg. 4. Left atrial size was severely dilated. 5. Right atrial size was severely dilated. 6. A small pericardial effusion is present. The pericardial effusion is localized near the right atrium. 7. The mitral valve is abnormal. Mild mitral valve regurgitation. No evidence of mitral stenosis. Severe mitral annular calcification. 8. The tricuspid valve is abnormal. Tricuspid valve regurgitation is moderate. 9. The aortic valve is tricuspid. There is mild calcification of the aortic valve. Aortic valve regurgitation is mild to moderate. No aortic stenosis is present. 10. The inferior vena cava is dilated in size with <50% respiratory variability, suggesting right atrial pressure of 15 mmHg.  Comparison(s): Changes from prior study are noted. LVEF worsened from 55% to 20-25% and RV systolic function is severely reduced. Severe pulmonary HTN.  FINDINGS Left Ventricle: Left ventricular ejection fraction, by estimation, is 20 to 25%. The left ventricle has severely decreased function. The left ventricle demonstrates global hypokinesis. Strain was performed and the global longitudinal strain is indeterminate. The left ventricular internal cavity size was normal in size. There is no left ventricular hypertrophy. Left ventricular diastolic function could not be evaluated due to atrial fibrillation. Left ventricular diastolic function could not be evaluated.  Right Ventricle: The right  ventricular size is normal. No increase in right ventricular wall thickness. Right ventricular systolic function is severely reduced. There is severely elevated pulmonary artery systolic pressure. The tricuspid regurgitant velocity is 3.50 m/s, and with an assumed right atrial pressure of 15 mmHg, the estimated right ventricular systolic pressure is 64.0 mmHg.  Left Atrium: Left atrial size was severely dilated.  Right Atrium: Right atrial size was severely dilated.  Pericardium: A small pericardial effusion is present. The pericardial effusion is localized near the right atrium.  Mitral Valve: The mitral valve is abnormal. Severe mitral annular calcification. Mild mitral valve regurgitation. No evidence of mitral valve stenosis.  Tricuspid Valve: The tricuspid valve is abnormal. Tricuspid valve regurgitation is moderate . No evidence of tricuspid stenosis.  Aortic Valve: The aortic valve is tricuspid. There is mild calcification of the aortic valve. Aortic valve regurgitation is mild to moderate. Aortic regurgitation PHT measures 616 msec. No aortic stenosis is present.  Pulmonic Valve: The pulmonic valve was normal in structure. Pulmonic valve regurgitation is trivial. No evidence of pulmonic stenosis.  Aorta: The aortic root is normal in size and structure.  Venous: The inferior vena cava is dilated in size with less than 50% respiratory variability, suggesting right atrial pressure of 15 mmHg.  IAS/Shunts: No atrial level shunt detected by color flow Doppler.  Additional Comments: 3D was performed not requiring image post processing on an independent workstation and was indeterminate.   LEFT VENTRICLE PLAX 2D LVIDd:         4.30 cm LVIDs:         3.80 cm LV PW:         1.00 cm LV IVS:        1.10 cm LVOT diam:     2.10 cm LVOT Area:     3.46 cm   RIGHT VENTRICLE            IVC RV Basal diam:  3.10 cm    IVC diam: 2.30 cm RV S prime:     7.83 cm/s TAPSE (M-mode): 1.2  cm  LEFT ATRIUM            Index        RIGHT ATRIUM           Index LA diam:      4.70 cm  2.80 cm/m   RA Area:     24.60 cm LA Vol (A2C): 115.0 ml 68.44 ml/m  RA Volume:   82.00 ml  48.80 ml/m AORTIC VALVE AI PHT:      616 msec  AORTA Ao Root diam: 3.50 cm  TRICUSPID VALVE TR Peak grad:   49.0 mmHg TR Vmax:        350.00 cm/s  SHUNTS Systemic Diam: 2.10 cm  Vishnu Priya Mallipeddi Electronically signed by Lucetta Russel Mallipeddi Signature Date/Time: 02/29/2024/1:20:29 PM    Final    MONITORS  LONG TERM MONITOR-LIVE TELEMETRY (3-14 DAYS) 02/20/2021  Narrative  Predominant rhythm is normal sinus rhythm with normal circadian variation.  There are occasional episodes of second-degree atrioventricular block, Mobitz type I, associated with transient bradycardia. The minimum heart rate is 22 bpm.  There are frequent PACs (12.3%), frequent atrial couplets (2.6%) and frequent episodes of nonsustained atrial tachycardia (4-13 beats, maximum 167 bpm), but there is no evidence of true atrial fibrillation.  The episodes of atrial tachyarrhythmia are seen exclusively during daytime hours and are completely abolished during sleep.  Rare PVCs are present and there is no significant complex ventricular arrhythmia.  Abnormal event monitor to the presence of  Very frequent premature atrial complexes and frequent episodes of nonsustained atrial tachycardia, occurring during daytime hours and absent at night.  There is no evidence of atrial fibrillation.  During sleep, there are occasional brief episodes of bradycardia due to second-degree atrioventricular block, Mobitz type I.       ______________________________________________________________________________________________       Laboratory Data:  High Sensitivity Troponin:   Recent Labs  Lab 02/28/24 1923 02/28/24 2301 02/29/24 0557  TROPONINIHS 93* 85* 88*     Chemistry Recent Labs  Lab 02/28/24 1923  02/28/24 2301 02/29/24 0557 03/01/24 1139  NA 140  --  142 139  K 4.6  --  4.5 4.7  CL 108  --  113* 107  CO2 22  --  18* 19*  GLUCOSE 284*  --  111* 83  BUN 44*  --  40* 47*  CREATININE 1.66*  --  1.54* 2.11*  CALCIUM  8.2*  --  7.7* 8.5*  MG  --  2.1  --  2.2  GFRNONAA 29*  --  32* 22*  ANIONGAP 10  --  11 13    Recent Labs  Lab 02/28/24 1923 02/29/24 0557  PROT 6.1* 5.9*  ALBUMIN  2.7* 2.6*  AST 65* 78*  ALT 52* 62*  ALKPHOS 70 71  BILITOT 0.7 0.8   Lipids No results for input(s): "CHOL", "TRIG", "HDL", "LABVLDL", "LDLCALC", "CHOLHDL" in the last 168 hours.  Hematology Recent Labs  Lab 02/28/24 1844 02/28/24 1923 02/29/24 0557  WBC  --  7.7 8.2  RBC  --  4.36 4.51  HGB 15.0 13.0 13.3  HCT 44.0 40.3 40.7  MCV  --  92.4 90.2  MCH  --  29.8 29.5  MCHC  --  32.3 32.7  RDW  --  16.2* 16.2*  PLT  --  158 156   Thyroid  No results for input(s): "  TSH", "FREET4" in the last 168 hours.  BNP Recent Labs  Lab 02/28/24 1923  BNP 1,073.6*    DDimer  Recent Labs  Lab 02/28/24 2302  DDIMER >20.00*     Radiology/Studies:  ECHOCARDIOGRAM COMPLETE Result Date: 02/29/2024    ECHOCARDIOGRAM REPORT   Patient Name:   LAURNA SHETLEY Date of Exam: 02/29/2024 Medical Rec #:  [id number]     Height:       63.0 in Accession #:    [id number]    Weight:       143.7 lb Date of Birth:  [birth year]    BSA:          1.680 m Patient Age:    89 years      BP:           140/116 mmHg Patient Gender: F             HR:           117 bpm. Exam Location:  Inpatient Procedure: 2D Echo (Both Spectral and Color Flow Doppler were utilized during            procedure). Indications:    atrial fibrillation  History:        Patient has prior history of Echocardiogram examinations, most                 recent 01/29/2021. Signs/Symptoms:Altered Mental Status; Risk                 Factors:Dyslipidemia and Hypertension.  Sonographer:    Dione Franks RDCS Referring Phys: [phone] EKTA V PATEL IMPRESSIONS  1.  Challenging study in the setting of tachycardia.  2. Left ventricular ejection fraction, by estimation, is 20 to 25%. The left ventricle has severely decreased function. The left ventricle demonstrates global hypokinesis. Left ventricular diastolic function could not be evaluated.  3. Right ventricular systolic function is severely reduced. The right ventricular size is normal. There is severely elevated pulmonary artery systolic pressure. The estimated right ventricular systolic pressure is 64.0 mmHg.  4. Left atrial size was severely dilated.  5. Right atrial size was severely dilated.  6. A small pericardial effusion is present. The pericardial effusion is localized near the right atrium.  7. The mitral valve is abnormal. Mild mitral valve regurgitation. No evidence of mitral stenosis. Severe mitral annular calcification.  8. The tricuspid valve is abnormal. Tricuspid valve regurgitation is moderate.  9. The aortic valve is tricuspid. There is mild calcification of the aortic valve. Aortic valve regurgitation is mild to moderate. No aortic stenosis is present. 10. The inferior vena cava is dilated in size with <50% respiratory variability, suggesting right atrial pressure of 15 mmHg. Comparison(s): Changes from prior study are noted. LVEF worsened from 55% to 20-25% and RV systolic function is severely reduced. Severe pulmonary HTN. FINDINGS  Left Ventricle: Left ventricular ejection fraction, by estimation, is 20 to 25%. The left ventricle has severely decreased function. The left ventricle demonstrates global hypokinesis. Strain was performed and the global longitudinal strain is indeterminate. The left ventricular internal cavity size was normal in size. There is no left ventricular hypertrophy. Left ventricular diastolic function could not be evaluated due to atrial fibrillation. Left ventricular diastolic function could not be  evaluated. Right Ventricle: The right ventricular size is normal. No increase in  right ventricular wall thickness. Right ventricular systolic function is severely reduced. There is severely elevated pulmonary artery systolic pressure. The tricuspid regurgitant velocity is 3.50 m/s, and with  an assumed right atrial pressure of 15 mmHg, the estimated right ventricular systolic pressure is 64.0 mmHg. Left Atrium: Left atrial size was severely dilated. Right Atrium: Right atrial size was severely dilated. Pericardium: A small pericardial effusion is present. The pericardial effusion is localized near the right atrium. Mitral Valve: The mitral valve is abnormal. Severe mitral annular calcification. Mild mitral valve regurgitation. No evidence of mitral valve stenosis. Tricuspid Valve: The tricuspid valve is abnormal. Tricuspid valve regurgitation is moderate . No evidence of tricuspid stenosis. Aortic Valve: The aortic valve is tricuspid. There is mild calcification of the aortic valve. Aortic valve regurgitation is mild to moderate. Aortic regurgitation PHT measures 616 msec. No aortic stenosis is present. Pulmonic Valve: The pulmonic valve was normal in structure. Pulmonic valve regurgitation is trivial. No evidence of pulmonic stenosis. Aorta: The aortic root is normal in size and structure. Venous: The inferior vena cava is dilated in size with less than 50% respiratory variability, suggesting right atrial pressure of 15 mmHg. IAS/Shunts: No atrial level shunt detected by color flow Doppler. Additional Comments: 3D was performed not requiring image post processing on an independent workstation and was indeterminate.  LEFT VENTRICLE PLAX 2D LVIDd:         4.30 cm LVIDs:         3.80 cm LV PW:         1.00 cm LV IVS:        1.10 cm LVOT diam:     2.10 cm LVOT Area:     3.46 cm  RIGHT VENTRICLE            IVC RV Basal diam:  3.10 cm    IVC diam: 2.30 cm RV S prime:     7.83 cm/s TAPSE (M-mode): 1.2 cm LEFT ATRIUM            Index        RIGHT ATRIUM           Index LA diam:      4.70 cm  2.80 cm/m    RA Area:     24.60 cm LA Vol (A2C): 115.0 ml 68.44 ml/m  RA Volume:   82.00 ml  48.80 ml/m  AORTIC VALVE AI PHT:      616 msec  AORTA Ao Root diam: 3.50 cm TRICUSPID VALVE TR Peak grad:   49.0 mmHg TR Vmax:        350.00 cm/s  SHUNTS Systemic Diam: 2.10 cm Vishnu Priya Mallipeddi Electronically signed by Lucetta Russel Mallipeddi Signature Date/Time: 02/29/2024/1:20:29 PM    Final    CT CHEST WO CONTRAST Result Date: 02/29/2024 CLINICAL DATA:  Pneumonia, pleural/pericardial effusion EXAM: CT CHEST WITHOUT CONTRAST TECHNIQUE: Multidetector CT imaging of the chest was performed following the standard protocol without IV contrast. RADIATION DOSE REDUCTION: This exam was performed according to the departmental dose-optimization program which includes automated exposure control, adjustment of the mA and/or kV according to patient size and/or use of iterative reconstruction technique. COMPARISON:  None Available. FINDINGS: Cardiovascular: Mild multi-vessel coronary artery calcification. Global cardiac size is enlarged with particular biatrial enlargement. No pericardial effusion. Central pulmonary arteries are enlarged in keeping with changes of pulmonary arterial hypertension. Moderate atherosclerotic calcification within the thoracic aorta. No aortic aneurysm. Mediastinum/Nodes: The thyroid  gland is diffusely enlarged and demonstrates heterogeneous attenuation, not well characterized on this examination. In the setting of significant comorbidities or limited life expectancy, no follow-up recommended (ref: J Am Coll Radiol. 2015 Feb;12(2): 143-50). Lungs/Pleura: Moderate right  and small left pleural effusions are present with associated bibasilar compressive atelectasis. Subpleural parenchymal scarring within the anterior left upper lobe may reflect the sequela of prior radiation therapy. Lungs are otherwise clear. No pneumothorax. No central obstructing lesion. Upper Abdomen: No acute abnormality. Musculoskeletal:  Osseous structures are diffusely osteopenic. Accentuated thoracic kyphosis. Bridging disc osteophytes are seen throughout the thoracic spine. No acute bone abnormality. IMPRESSION: 1. Moderate right and small left pleural effusions with associated bibasilar compressive atelectasis. 2. Global cardiomegaly with biatrial enlargement. 3. Enlarged central pulmonary arteries in keeping with changes of pulmonary arterial hypertension. Aortic Atherosclerosis (ICD10-I70.0). Electronically Signed   By: Worthy Heads M.D.   On: 02/29/2024 02:59   MR BRAIN WO CONTRAST Result Date: 02/29/2024 CLINICAL DATA:  Mental status change, unknown cause EXAM: MRI HEAD WITHOUT CONTRAST TECHNIQUE: Multiplanar, multiecho pulse sequences of the brain and surrounding structures were obtained without intravenous contrast. COMPARISON:  CT head Feb 28, 2024. FINDINGS: Motion limited study.  Within this limitation: Brain: No acute infarction, hemorrhage, hydrocephalus, extra-axial collection or mass lesion. Remote perforator infarct in the left basal ganglia. Chronic microvascular ischemic disease. Cerebral atrophy. Vascular: Major arterial flow voids are maintained at the skull base. Skull and upper cervical spine: Normal marrow signal. Sinuses/Orbits: Negative. IMPRESSION: 1. Motion limited study without evidence of acute intracranial abnormality. 2. Remote left basal ganglia perforator infarct. 3.  Cerebral Atrophy (ICD10-G31.9). Electronically Signed   By: Stevenson Elbe M.D.   On: 02/29/2024 00:26   CT HEAD WO CONTRAST Result Date: 02/28/2024 CLINICAL DATA:  Altered mental status EXAM: CT HEAD WITHOUT CONTRAST TECHNIQUE: Contiguous axial images were obtained from the base of the skull through the vertex without intravenous contrast. RADIATION DOSE REDUCTION: This exam was performed according to the departmental dose-optimization program which includes automated exposure control, adjustment of the mA and/or kV according to patient  size and/or use of iterative reconstruction technique. COMPARISON:  04/03/2021 FINDINGS: Brain: No evidence of acute infarction, hemorrhage, hydrocephalus, extra-axial collection or mass lesion/mass effect. Chronic atrophic changes and chronic white matter ischemic changes are noted. Lacunar infarct is noted in the basal ganglia on the left. Vascular: No hyperdense vessel or unexpected calcification. Skull: Normal. Negative for fracture or focal lesion. Sinuses/Orbits: No acute finding. Other: None. IMPRESSION: Chronic atrophic and ischemic changes without acute abnormality. Electronically Signed   By: Violeta Grey M.D.   On: 02/28/2024 19:39   DG Chest Port 1 View Result Date: 02/28/2024 CLINICAL DATA:  Altered mental status EXAM: PORTABLE CHEST 1 VIEW COMPARISON:  April 03, 2021 FINDINGS: The cardiomediastinal silhouette is enlarged in contour. Moderate RIGHT pleural effusion. No pneumothorax. RIGHT basilar margin is opacity. LEFT retrocardiac opacity. IMPRESSION: 1. Moderate RIGHT pleural effusion with possible associated atelectasis. Recommend follow-up radiograph after resolution of pleural fluid. 2. LEFT retrocardiac opacity, favored atelectasis. Electronically Signed   By: Clancy Crimes M.D.   On: 02/28/2024 18:46     Risk Assessment/Risk Scores:   Biventricular Heart Failure - Patient previously had EF 45% in 2019. Stress test at that time normal. EF had improved to 55% in 2022 - Presented with concerns for failure to thrive. BNP elevated to 1073. CT chest with moderate right and small left pleural effusions with associated bibasilar compressive atelectasis. Lactic acid 5.1>5.4>1.9  - Echo this admission showed EF 20-25%, severely reduced RV systolic function, severely elevated PA systolic pressure.  - Patient has been on IV lasix - not much urine output. Continues to have lower extremity swelling and requiring 2 L oxygen.  Creatinine rising 1.54>2.11. LFTs also elevated.  - BP low with SBP in  the 90s. Patient is somnolent and difficult to arouse. Unable to add or titrate GDMT   - Concern that patient is in low output heart failure/cardiogenic shock. Patient's daughter is at bedside, reports that patient would not want invasive procedures. Dr. Arlester Ladd to discuss attempting milrinone therapy and tracking co-ox, vs palliative/comfort care   New onset atrial fibrillation  - Patient presented to the ED in atrial fibrillation. Was treated with IV amiodarone  and eliquis . Converted to NSR yesterday evening  - Per telemetry, patient maintaining NSR  - Continue eliquis  2.5 mg BID, IV amiodarone  for now   AKI  - Creatinine 1.54>2.11  - As above, concern for low output heart failure. Consider palliative consult   Otherwise per primary  - Malnutrition  - AMS - patient somnolent, difficult to arouse. Daughter reports that patient has been declining for the past month or so, but has been especially somnolent for the past 2 weeks. Reports that patient sleeps until 4 PM, eats dinner, then goes back to sleep. Today, patient difficult to arouse    Risk Assessment/Risk Scores:    New York  Heart Association (NYHA) Functional Class NYHA Class IV  CHA2DS2-VASc Score = 6  his indicates a 9.7% annual risk of stroke. The patient's score is based upon: CHF History: 1 HTN History: 1 Diabetes History: 0 Stroke History: 0 Vascular Disease History: 1 Age Score: 2 Gender Score: 1    For questions or updates, please contact [facility] Please consult [email or web address] for contact info under    Signed, Debria Fang, PA-C  03/01/2024 3:54 PM  Patient seen, examined. Available data reviewed. Agree with findings, assessment, and plan as outlined by Duwayne Ginsberg, PA-C.  The patient is independently interviewed and examined.  Her daughter is present at the bedside.  On my exam, the patient is an elderly, somnolent woman who is in no apparent distress.  She is arousable and intermittently  responds to command but does not consistently follow.  HEENT is normal.  JVP is elevated.  Carotid upstrokes are normal with no bruits.  Lungs are diminished in the bases but otherwise clear.  Heart is irregularly irregular with no murmur or gallop.  Abdomen is soft, thin, and nontender.  Lower extremities have 2+ pretibial and ankle edema.  Feet are cool bilaterally.  The patient's chest x-ray shows a moderate right pleural effusion.  Blood work is pertinent for acute kidney injury with creatinine up to 2.1, lactic acidosis with lactate peak of 5.4, now down trended to 1.9.  She has mild transaminitis.  BNP is elevated at 1074.  High-sensitivity troponin is minimally elevated with a flat trend ranging between 85 and 93.  The patient's 2D echocardiogram is reviewed and demonstrates severe LV systolic dysfunction with LVEF 20 to 25%, severe RV dysfunction with severely elevated PA pressure, severe biatrial enlargement, small pericardial effusion, mild MR, and moderate TR.  In summary, the patient has clinical evidence of severe biventricular heart failure complicated by cardiogenic shock with hypotension on presentation, lactic acidosis, acute kidney injury, and cool extremities.  At baseline she has underlying dementia and has declined now for some time with much more precipitous decline over the last 4 weeks per her daughter's report.  At her very advanced age, I think the likelihood of any medical intervention improving her outcome is exceedingly low.  She is not a candidate for any kind of invasive interventions.  Supportive  measures like inotropic therapy would not have any meaningful long-term outcome improvement for her.  After shared decision making conversation with her daughter, we agree that a palliative/comfort approach to her care is most appropriate.  I spoke with the patient's attending physician Dr. Sunnie England who agrees and will place palliative care consultation.  Arnoldo Lapping,  M.D. 03/01/2024 4:08 PM

## 2024-03-01 NOTE — Plan of Care (Signed)
  Problem: Nutrition: Goal: Adequate nutrition will be maintained Outcome: Not Progressing   Problem: Elimination: Goal: Will not experience complications related to bowel motility Outcome: Progressing   Problem: Safety: Goal: Ability to remain free from injury will improve Outcome: Progressing   Problem: Skin Integrity: Goal: Risk for impaired skin integrity will decrease Outcome: Progressing

## 2024-03-01 NOTE — Progress Notes (Signed)
 PT Cancellation Note  Patient Details Name: Diane Ross MRN: [id number] DOB: 05/25/1931   Cancelled Treatment:    Reason Eval/Treat Not Completed: Other (comment). Pt lethargic and difficult to arouse. Pt kept eyes closed, When PT would attempt to pull blankets down pt would wake up and say no. Pt with poor attention and engagement inhibiting participation with PT. Pt BP 87/26 at rest in the bed. Pt oriented to name but not place. Acute PT to return as able, as appropriate to complete PT eval.  Renaee Caro, PT, DPT Acute Rehabilitation Services Secure chat preferred Office #: [phone]    Jenna Moan 03/01/2024, 9:33 AM

## 2024-03-01 NOTE — Progress Notes (Signed)
 OT Cancellation Note  Patient Details Name: Diane Ross MRN: [id number] DOB: 05-30-1931   Cancelled Treatment:    Reason Eval/Treat Not Completed: Patient's level of consciousness (pt too lethargic per RN, will reattempt OT evaluation as appropriate.)  Sheccid Lahmann K, OTD, OTR/L SecureChat Preferred Acute Rehab [phone]   Antionette Kirks 03/01/2024, 12:55 PM

## 2024-03-01 NOTE — Progress Notes (Addendum)
 Progress Note   Patient: Diane Ross UJW:[id number] DOB: 05-Apr-1931 DOA: 02/28/2024     2 DOS: the patient was seen and examined on 03/01/2024   Brief hospital course: Mrs. Cogar was admitted to the hospital with the working diagnosis of acute metabolic encephalopathy in the setting or pneumonia.   89 yo female with the past medical history of hypertension, heart failure, and history of breast cancer who presented with failure to thrive. Per her daughter, patient's health has been rapidly declining over last several weeks prior to hospitalization. Decreased mobility, decreased po intake and less interactive. EMS was called. She was found hypotensive, received 250 ml IV NS and was transported to the ED.  On her initial physical examination her blood pressure was 157/99, HR 111, R 24 and 02 saturation 100% Lungs with bilateral rales, heart with S1 and S2 present and irregular with no gallops, abdomen with no distention and non tender and no lower extremity edema.  Patient poorly interactive and slow to respond.   Na 140, K 4,6 CL 108 bicarbonate 22, glucose 284, bun 44 cr 1,64  AST 65 ALT 52  BNP 1,073  High sensitive troponin 93, 85 and 88  Lactic acid 5,4 and 1,9  Wbc 7,7 hgb 13.0 plt 158  Sars covid 19 negative Influenza negative RSV negative   CT head with chronic atrophic and ischemic changes without acute abnormalities.  MRI brain with no evidence of acute intracranial abnormalities. Remote left basal ganglia perforator infarct.  Cerebral atrophy.   Chest radiograph with right rotation, positive cardiomegaly with bilateral hilar vascular congestion, with right pleural effusion with right basal atelectasis.   CT chest with bilateral pleural effusions, moderate on the right and small on the left, bibasilar compression atelectasis, enlarged pulmonary arteries.   EKG 89 bpm, normal axis, normal intervals, qtc 407, atrial fibrillation rhythm with no significant ST segment, negative  T wave V4 to V6. Positive PVC.   Patient was placed on IV furosemide  and IV amiodarone .  05/19 patient converted to sinus rhythm, continue volume overloaded.   Assessment and Plan: * Chronic combined systolic and diastolic congestive heart failure (HCC) Echocardiogram with worsening LV systolic function with EF 20 to 25%, global hypokinesis, RV systolic function with severe reduction, RVSP 64. mmHg, LA and RA with severe dilatation, small pericardial effusion, mild mitral valve regurgitation, moderate tricuspid valve regurgitation, mild to moderate aortic regurgitation.   Acute on chronic core pulmonale.  Pulmonary hypertension  RV failure   Continue volume overloaded.   Plan to increase furosemide  to 80 mg IV bid.  Limited therapy due to acutely reduced GFR and hypotension  Close blood pressure monitoring   Acute cardiogenic pulmonary edema with bilateral pleural effusions.  Continue diuresis with furosemide .  No clinical signs of pneumonia, pneumonia was been ruled out.  New onset atrial fibrillation [facility]) Patient has converted to sinus rhythm with rate at 60 bpm.   Old records personally reviewed 2019 she has duodenal ulcer.  Risk vs benefit will continue anticoagulation with close follow up .  Continue with pantoprazole .   Plan to continue amiodarone  load and anticoagulation.  Poor prognosis considering reduced LV systolic function and pulmonary hypertension.   Acute kidney injury superimposed on stage 3b chronic kidney disease (HCC) Worsening renal function with serum cr at 2,11 with k at 4,7 and serum bicarbonate at 19  Na 139 and Mg 2.2   Plan to continue diuresis with furosemide  and follow up renal function in am.  Avoid hypotension and  nephrotoxic medications.   AMS (altered mental status) Probably she has cognitive dysfunction at baseline.  Will continue supportive medical therapy   Continue mirtazapine , divalproex  and donepezil .   Essential  hypertension Continue blood pressure monitoring  Holding on carvedilol .   Malnutrition of moderate degree Continue nutritional support. Follow up with nutrition consultation        Subjective: Patient continue to have dyspnea and edema, very weak and deconditioned no chest pain.   Physical Exam: Vitals:   02/29/24 2315 03/01/24 0024 03/01/24 0434 03/01/24 0735  BP: (!) 143/99 (!) 154/119 (!) 141/82 110/82  Pulse:      Resp: 16 16 20 17   Temp: 98 F (36.7 C)  97.6 F (36.4 C) 97.7 F (36.5 C)  TempSrc: Axillary  Oral Oral  SpO2: 95%   96%  Weight:   69 kg    Neurology awake and alert ENT with mild pallor with no icterus Cardiovascular with S1 and S2 present and regular with no gallops, rubs, positive systolic murmur at the apex  Positive JVD Respiratory with no positive rales at bases with no wheezing or rhonchi  Abdomen with no distention   Data Reviewed:   Family Communication: no family at the bedside. I spoke with patient's daughter at the bedside, we talked in detail about patient's condition, plan of care and prognosis and all questions were addressed.   Disposition: Status is: Inpatient Remains inpatient appropriate because: recovering heart failure   Planned Discharge Destination: Home    Author: Albertus Alt, MD 03/01/2024 7:44 AM  For on call review [email or web address].

## 2024-03-01 NOTE — TOC Initial Note (Addendum)
 Transition of Care [facility]) - Initial/Assessment Note    Patient Details  Name: Diane Ross MRN: [id number] Date of Birth: 1930-12-05  Transition [facility]) CM/SW Contact:    Juliane Och, LCSW Phone Number: 03/01/2024, 11:38 AM  Clinical Narrative:                  11:38 AM CSW introduced self and role to patient's daughter, Diane Ross (patient is not currently fully oriented). Diane Ross confirmed that patient resides with her. Diane Ross stated that she could provide transportation and home support upon discharge but not 24/7 which will be needed due to patient;s mental status. Diane Ross stated that patient has difficulty opening the door for guests to enter the home. Diane Ross stated that patient has a rolling walker at home and [facility] history with [facility]. CSW offerd to email [facility] care resources. Diane Ross accepted CSW offer. CSW emailed [facility] care resources ([email or web address]). [facility] expressed interest in Medicaid. CSW consulted financial counseling for further assistance.  Expected Discharge Plan: Memory Care Barriers to Discharge: Continued Medical Work up   Patient Goals and CMS Choice            Expected Discharge Plan and Services       Living arrangements for the past 2 months: Single Family Home                                      Prior Living Arrangements/Services Living arrangements for the past 2 months: Single Family Home Lives with:: Adult Children Patient language and need for interpreter reviewed:: Yes        Need for Family Participation in Patient Care: Yes (Comment)     Criminal Activity/Legal Involvement Pertinent to Current Situation/Hospitalization: No - Comment as needed  Activities of Daily Living   ADL Screening (condition at time of admission) Independently performs ADLs?: No Does the patient have a NEW difficulty with bathing/dressing/toileting/self-feeding that is expected to last >3 days?: Yes (Initiates electronic notice to provider for  possible OT consult) Does the patient have a NEW difficulty with getting in/out of bed, walking, or climbing stairs that is expected to last >3 days?: Yes (Initiates electronic notice to provider for possible PT consult) Does the patient have a NEW difficulty with communication that is expected to last >3 days?: No Is the patient deaf or have difficulty hearing?: Yes Does the patient have difficulty seeing, even when wearing glasses/contacts?: No Does the patient have difficulty concentrating, remembering, or making decisions?: Yes  Permission Sought/Granted Permission sought to share information with : Family Supports Permission granted to share information with : No (Contact information on chart)  Share Information with NAME: Diane Ross     Permission granted to share info w Relationship: Daughter  Permission granted to share info w Contact Information: [phone]  Emotional Assessment Appearance:: Appears stated age Attitude/Demeanor/Rapport: Unable to Assess Affect (typically observed): Unable to Assess   Alcohol  / Substance Use: Not Applicable Psych Involvement: No (comment)  Admission diagnosis:  Lactic acidosis [E87.20] Hypoglycemia [E16.2] AKI (acute kidney injury) (HCC) [N17.9] Atrial fibrillation with RVR (HCC) [I48.91] Altered mental status, unspecified altered mental status type [R41.82] Community acquired pneumonia, unspecified laterality [J18.9] AMS (altered mental status) [R41.82] Patient Active Problem List   Diagnosis Date Noted   New onset atrial fibrillation (HCC) 02/29/2024   Acute kidney injury superimposed on stage 3b chronic kidney disease (HCC) 02/29/2024   AMS (altered  mental status) 02/28/2024   Syncope 04/03/2021   Multiple thyroid  nodules 06/01/2018   Malnutrition of moderate degree 03/30/2018   Duodenal ulcer 03/29/2018   Acute blood loss anemia 03/29/2018   Breast cancer (HCC) 03/28/2018   Syncope, vasovagal 03/28/2018   GIB (gastrointestinal  bleeding) 03/28/2018   Chronic combined systolic and diastolic congestive heart failure (HCC) 01/02/2018   Essential hypertension 01/02/2018   Hypercholesterolemia 01/02/2018   Hyperthyroidism 01/02/2018   PCP:  Arva Lathe, MD Pharmacy:   [facility] [address] ([address]), [place] - [address] [place] ([place]) [place] [address] Phone: [phone] Fax: [phone]     Social Drivers of Health (SDOH) Social History: SDOH Screenings   Transportation Needs: No Transportation Needs (05/14/2021)   Received from [facility]  Financial Resource Strain: Low Risk  (05/15/2021)   Received from [facility]  Social Connections: Unknown (02/26/2022)   Received from [facility]  Stress: No Stress Concern Present (05/13/2021)   Received from [facility]  Tobacco Use: Medium Risk (02/28/2024)   SDOH Interventions:     Readmission Risk Interventions     No data to display

## 2024-03-01 NOTE — Discharge Instructions (Signed)

## 2024-03-02 ENCOUNTER — Inpatient Hospital Stay (HOSPITAL_COMMUNITY)

## 2024-03-02 DIAGNOSIS — I4891 Unspecified atrial fibrillation: Secondary | ICD-10-CM | POA: Diagnosis not present

## 2024-03-02 DIAGNOSIS — R570 Cardiogenic shock: Secondary | ICD-10-CM

## 2024-03-02 DIAGNOSIS — I82502 Chronic embolism and thrombosis of unspecified deep veins of left lower extremity: Secondary | ICD-10-CM

## 2024-03-02 DIAGNOSIS — I5042 Chronic combined systolic (congestive) and diastolic (congestive) heart failure: Secondary | ICD-10-CM | POA: Diagnosis not present

## 2024-03-02 DIAGNOSIS — R609 Edema, unspecified: Secondary | ICD-10-CM | POA: Diagnosis not present

## 2024-03-02 DIAGNOSIS — N179 Acute kidney failure, unspecified: Secondary | ICD-10-CM | POA: Diagnosis not present

## 2024-03-02 DIAGNOSIS — R410 Disorientation, unspecified: Secondary | ICD-10-CM | POA: Diagnosis not present

## 2024-03-02 LAB — CBC WITH DIFFERENTIAL/PLATELET
Abs Immature Granulocytes: 0.04 10*3/uL (ref 0.00–0.07)
Basophils Absolute: 0 10*3/uL (ref 0.0–0.1)
Basophils Relative: 0 %
Eosinophils Absolute: 0.2 10*3/uL (ref 0.0–0.5)
Eosinophils Relative: 2 %
HCT: 40.1 % (ref 36.0–46.0)
Hemoglobin: 12.9 g/dL (ref 12.0–15.0)
Immature Granulocytes: 0 %
Lymphocytes Relative: 17 %
Lymphs Abs: 1.6 10*3/uL (ref 0.7–4.0)
MCH: 29.4 pg (ref 26.0–34.0)
MCHC: 32.2 g/dL (ref 30.0–36.0)
MCV: 91.3 fL (ref 80.0–100.0)
Monocytes Absolute: 1 10*3/uL (ref 0.1–1.0)
Monocytes Relative: 11 %
Neutro Abs: 6.3 10*3/uL (ref 1.7–7.7)
Neutrophils Relative %: 70 %
Platelets: 184 10*3/uL (ref 150–400)
RBC: 4.39 MIL/uL (ref 3.87–5.11)
RDW: 16.3 % — ABNORMAL HIGH (ref 11.5–15.5)
WBC: 9.1 10*3/uL (ref 4.0–10.5)
nRBC: 0.2 % (ref 0.0–0.2)

## 2024-03-02 LAB — BASIC METABOLIC PANEL WITH GFR
Anion gap: 11 (ref 5–15)
BUN: 48 mg/dL — ABNORMAL HIGH (ref 8–23)
CO2: 23 mmol/L (ref 22–32)
Calcium: 8.2 mg/dL — ABNORMAL LOW (ref 8.9–10.3)
Chloride: 106 mmol/L (ref 98–111)
Creatinine, Ser: 2.19 mg/dL — ABNORMAL HIGH (ref 0.44–1.00)
GFR, Estimated: 21 mL/min — ABNORMAL LOW (ref >60)
Glucose, Bld: 83 mg/dL (ref 70–99)
Potassium: 4.2 mmol/L (ref 3.5–5.1)
Sodium: 140 mmol/L (ref 135–145)

## 2024-03-02 LAB — MAGNESIUM: Magnesium: 2 mg/dL (ref 1.7–2.4)

## 2024-03-02 MED ORDER — MIDODRINE HCL 5 MG PO TABS
10.0000 mg | ORAL_TABLET | Freq: Three times a day (TID) | ORAL | Status: DC
  Administered 2024-03-02: 10 mg via ORAL
  Filled 2024-03-02: qty 2

## 2024-03-02 MED ORDER — HALOPERIDOL LACTATE 2 MG/ML PO CONC: 0.5000 mg | ORAL | Status: DC | PRN

## 2024-03-02 MED ORDER — ACETAMINOPHEN 650 MG RE SUPP: 650.0000 mg | Freq: Four times a day (QID) | RECTAL | Status: DC | PRN

## 2024-03-02 MED ORDER — POLYVINYL ALCOHOL 1.4 % OP SOLN
1.0000 [drp] | Freq: Four times a day (QID) | OPHTHALMIC | Status: DC | PRN
  Administered 2024-03-03: 1 [drp] via OPHTHALMIC
  Filled 2024-03-02: qty 15

## 2024-03-02 MED ORDER — HALOPERIDOL LACTATE 5 MG/ML IJ SOLN
0.5000 mg | INTRAMUSCULAR | Status: DC | PRN
  Administered 2024-03-03 – 2024-03-05 (×2): 0.5 mg via INTRAVENOUS
  Filled 2024-03-02: qty 1
  Filled 2024-03-02 (×3): qty 0.1

## 2024-03-02 MED ORDER — ACETAMINOPHEN 325 MG PO TABS
650.0000 mg | ORAL_TABLET | Freq: Four times a day (QID) | ORAL | Status: DC | PRN
  Administered 2024-03-02 – 2024-03-03 (×2): 650 mg via ORAL
  Filled 2024-03-02 (×2): qty 2

## 2024-03-02 MED ORDER — HALOPERIDOL 1 MG PO TABS: 0.5000 mg | ORAL_TABLET | ORAL | Status: DC | PRN

## 2024-03-02 MED ORDER — GLYCOPYRROLATE 0.2 MG/ML IJ SOLN
0.2000 mg | INTRAMUSCULAR | Status: DC | PRN
  Administered 2024-03-03 – 2024-03-07 (×8): 0.2 mg via INTRAVENOUS
  Filled 2024-03-02 (×8): qty 1

## 2024-03-02 MED ORDER — BIOTENE DRY MOUTH MT LIQD: 15.0000 mL | OROMUCOSAL | Status: DC | PRN

## 2024-03-02 MED ORDER — ONDANSETRON 4 MG PO TBDP: 4.0000 mg | ORAL_TABLET | Freq: Four times a day (QID) | ORAL | Status: DC | PRN

## 2024-03-02 MED ORDER — GLYCOPYRROLATE 0.2 MG/ML IJ SOLN
0.2000 mg | INTRAMUSCULAR | Status: DC | PRN
  Filled 2024-03-02: qty 1

## 2024-03-02 MED ORDER — ONDANSETRON HCL 4 MG/2ML IJ SOLN
4.0000 mg | Freq: Four times a day (QID) | INTRAMUSCULAR | Status: DC | PRN
  Administered 2024-03-07: 4 mg via INTRAVENOUS
  Filled 2024-03-02: qty 2

## 2024-03-02 MED ORDER — HYDROMORPHONE HCL 1 MG/ML IJ SOLN
0.5000 mg | INTRAMUSCULAR | Status: DC | PRN
  Administered 2024-03-03: 0.5 mg via INTRAVENOUS
  Administered 2024-03-04 (×2): 1 mg via INTRAVENOUS
  Administered 2024-03-04 – 2024-03-06 (×5): 0.5 mg via INTRAVENOUS
  Administered 2024-03-06: 1 mg via INTRAVENOUS
  Administered 2024-03-06 (×2): 0.5 mg via INTRAVENOUS
  Administered 2024-03-07: 1 mg via INTRAVENOUS
  Filled 2024-03-02 (×11): qty 1
  Filled 2024-03-02: qty 0.5

## 2024-03-02 MED ORDER — GLYCOPYRROLATE 1 MG PO TABS: 1.0000 mg | ORAL_TABLET | ORAL | Status: DC | PRN

## 2024-03-02 NOTE — Assessment & Plan Note (Signed)
 US  with age indeterminate deep vein thrombosis involving the left popliteal vein.

## 2024-03-02 NOTE — Progress Notes (Signed)
 PT Cancellation Note  Patient Details Name: Diane Ross MRN: [id number] DOB: 1930-10-28   Cancelled Treatment:    Reason Eval/Treat Not Completed: Other (comment)  Noted pt has transitioned to comfort care. Will sign off.    Gayle Kava, PT Acute Rehabilitation Services  Office [phone]   Guilford Leep 03/02/2024, 1:23 PM

## 2024-03-02 NOTE — Progress Notes (Signed)
   Patient Name: Diane Ross Date of Encounter: 03/02/2024 [facility] Cardiologist: Luana Rumple, MD   Interval Summary  .    IV amiodarone  stopped overnight due to hypotension; looks to have converted to SR overnight.  No other acute events.  Patient is somnolent and difficult to arouse today.  Vital Signs .    Vitals:   03/02/24 0020 03/02/24 0418 03/02/24 0730 03/02/24 0736  BP: (!) 89/77 (!) 87/74 (!) 88/67 (!) 74/59  Pulse: 64 66    Resp: 16 19 15 20   Temp:  (!) 97.4 F (36.3 C) 97.6 F (36.4 C) 97.6 F (36.4 C)  TempSrc:  Axillary Oral Oral  SpO2: (!) 87% 99%  98%  Weight:  68.1 kg    Height:        Intake/Output Summary (Last 24 hours) at 03/02/2024 0908 Last data filed at 03/02/2024 0850 Gross per 24 hour  Intake 1037.3 ml  Output 1100 ml  Net -62.7 ml      03/02/2024    4:18 AM 03/01/2024    4:34 AM 02/29/2024    6:30 AM  Last 3 Weights  Weight (lbs) 150 lb 2.1 oz 152 lb 1.9 oz 143 lb 11.8 oz  Weight (kg) 68.1 kg 69 kg 65.2 kg      Telemetry/ECG    SR with PACS - Personally Reviewed  Physical Exam .   GEN: No acute distress.   Neck: JVP to mid neck Cardiac:  RRR, no murmurs, rubs, or gallops.  Respiratory: Decreased BS at both bases GI: Soft, nontender, non-distended  MS: No edema, warm  Assessment & Plan .      Biventricular heart failure/shock:  Seems warm and well perfused today.  She is not a candidate for advanced therapies as detailed in yesterday's note.  Continue current medical therapy.  Palliative care will be seeing the patient today. Atrial fibrillation: Seems to converted to normal sinus rhythm.  Patient not really taking much by way of p.o. intake.  Pills are being crushed.  Will change to amiodarone  to 200 mg twice daily; continue Eliquis  2.5 mg. AKI: Creatinine plateauing at 2.19.  Continue current therapy. Altered mental status: Per primary team. Disposition: Primary team to obtain palliative care consult.  Will sign  off.  Call with questions. For questions or updates, please contact [facility] Please consult [email or web address] for contact info under        Signed, Estrellita Lasky K Chetara Kropp, MD

## 2024-03-02 NOTE — TOC Progression Note (Signed)
 Transition of Care [facility]) - Progression Note    Patient Details  Name: Diane Ross MRN: [id number] Date of Birth: 29-Mar-1931  Transition of Care [facility]) CM/SW Contact  Juliane Och, LCSW Phone Number: 03/02/2024, 12:02 PM  Clinical Narrative:     12:02 PM  Per chart review, patient has recently transitioned to comfort care.  Expected Discharge Plan: Memory Care Barriers to Discharge: Continued Medical Work up  Expected Discharge Plan and Services       Living arrangements for the past 2 months: Single Family Home                                       Social Determinants of Health (SDOH) Interventions SDOH Screenings   Transportation Needs: No Transportation Needs (05/14/2021)   [facility]  Financial Resource Strain: Low Risk  (05/15/2021)   Received from [facility]  Social Connections: Unknown (02/26/2022)   Received from [facility]  Stress: No Stress Concern Present (05/13/2021)   Received from [facility]  Tobacco Use: Medium Risk (02/28/2024)    Readmission Risk Interventions     No data to display

## 2024-03-02 NOTE — Care Management Important Message (Signed)
 Important Message  Patient Details  Name: Diane Ross MRN: [id number] Date of Birth: 17-Oct-1930   Important Message Given:  Yes - Medicare IM     [name] 03/02/2024, 3:09 PM

## 2024-03-02 NOTE — Progress Notes (Signed)
 OT Cancellation Note  Patient Details Name: Gennell How MRN: [id number] DOB: 1930/10/24   Cancelled Treatment:    Reason Eval/Treat Not Completed: Other (comment) (pt now comfort care, will s/o. please reorder as appropriate.)  Ryonna Cimini K, OTD, OTR/L SecureChat Preferred Acute Rehab [phone]   Benedict Brain Koonce 03/02/2024, 1:26 PM

## 2024-03-02 NOTE — IPAL (Signed)
 I spoke with patient's daughter at the bedside. We talked about Diane Ross condition of worsening heart failure, now in cardiogenic shock. She has not responded to initial aggressive medical therapy. Considering her comorbid conditions, unfortunately, she is not candidate for advanced therapies.  Her daughter is in agreement in continue care under comfort care. She understands patient's death is imminent.

## 2024-03-02 NOTE — Progress Notes (Signed)
 BLE venous duplex has been completed.  Prelimianry results given to Dr. Arrien.   Results can be found under chart review under CV PROC. 03/02/2024 2:35 PM Marcos Ruelas RVT, RDMS

## 2024-03-02 NOTE — Progress Notes (Signed)
 SLP Cancellation Note  Patient Details Name: Diane Ross MRN: [id number] DOB: 11/04/1930   Cancelled treatment:       Reason Eval/Treat Not Completed: Patient's level of consciousness Patient asleep, unable to rouse. Granddaughter arrived into room. SLP will continue to follow.   Jacqualine Mater, MA, CCC-SLP Speech Therapy

## 2024-03-02 NOTE — Progress Notes (Signed)
 TRH night cross cover note:   I was notified by the patient's RN that this patient, who is currently on amiodarone  drip in the setting of a new diagnosis of atrial fibrillation, is having some softer blood pressures at this time, with most recent blood pressure noted to be 74/57 with MAP of 64.  She is in sinus rhythm at this time with heart rates in the low 60s.  Other vital signs are notable for the following: Temperature 97.8 3 per rectal temperature; respiratory rate 14-16 oxygen saturation in the high 90s on 2 L nasal cannula.  Hospital course includes altered mental status, with chart review revealing suspicion for a degree of baseline cognitive dysfunction.  No change in the patient's mental status across this evening's night shift, and the patient is currently without acute complaint.  In light of the above vital signs as well as the patient's history of chronic combined systolic/diastolic heart failure,I refrained from ivf's, but rather asked patient's RN to please hold amiodarone  drip for now.  After holding amiodarone  drip, the patient's blood pressure is already beginning to increase, with systolic values now in the high 80s, and maps greater than 65 mmHg.   Additionally, noting that the patient is now on anticoagulation via Eliquis , I have also ordered CBC to be checked with morning labs.  Will continue to monitor ensuing blood pressure/HR trend.     Camelia Cavalier, DO Hospitalist

## 2024-03-02 NOTE — Progress Notes (Addendum)
 Progress Note   Patient: Diane Ross ZOX:[id number] DOB: 1930/10/25 DOA: 02/28/2024     3 DOS: the patient was seen and examined on 03/02/2024   Brief hospital course: Diane Ross was admitted to the hospital with the working diagnosis of acute metabolic encephalopathy in the setting or pneumonia.   89 yo female with the past medical history of hypertension, heart failure, and history of breast cancer who presented with failure to thrive. Per her daughter, patient's health has been rapidly declining over last several weeks prior to hospitalization. Decreased mobility, decreased po intake and less interactive. EMS was called. She was found hypotensive, received 250 ml IV NS and was transported to the ED.  On her initial physical examination her blood pressure was 157/99, HR 111, R 24 and 02 saturation 100% Lungs with bilateral rales, heart with S1 and S2 present and irregular with no gallops, abdomen with no distention and non tender and no lower extremity edema.  Patient poorly interactive and slow to respond.   Na 140, K 4,6 CL 108 bicarbonate 22, glucose 284, bun 44 cr 1,64  AST 65 ALT 52  BNP 1,073  High sensitive troponin 93, 85 and 88  Lactic acid 5,4 and 1,9  Wbc 7,7 hgb 13.0 plt 158  Sars covid 19 negative Influenza negative RSV negative   CT head with chronic atrophic and ischemic changes without acute abnormalities.  MRI brain with no evidence of acute intracranial abnormalities. Remote left basal ganglia perforator infarct.  Cerebral atrophy.   Chest radiograph with right rotation, positive cardiomegaly with bilateral hilar vascular congestion, with right pleural effusion with right basal atelectasis.   CT chest with bilateral pleural effusions, moderate on the right and small on the left, bibasilar compression atelectasis, enlarged pulmonary arteries.   EKG 89 bpm, normal axis, normal intervals, qtc 407, atrial fibrillation rhythm with no significant ST segment, negative  T wave V4 to V6. Positive PVC.   Patient was placed on IV furosemide  and IV amiodarone .  05/19 patient converted to sinus rhythm, continue volume overloaded. Developed cardiogenic shock. Considering poor prognosis and biventricular failure, decision was made to transition to comfort care.  05/20 Comfort care measures, imminent death.    Assessment and Plan: * Chronic combined systolic and diastolic congestive heart failure (HCC) Echocardiogram with worsening LV systolic function with EF 20 to 25%, global hypokinesis, RV systolic function with severe reduction, RVSP 64. mmHg, LA and RA with severe dilatation, small pericardial effusion, mild mitral valve regurgitation, moderate tricuspid valve regurgitation, mild to moderate aortic regurgitation.   Acute on chronic core pulmonale.  Pulmonary hypertension  RV failure   Patient with very poor prognosis, she has not responded to initial therapy with amiodarone  and aggressive IV furosemide .  She has developed cardiogenic shock  Patient not candidate for advanced therapies, poor prognosis,  Decision was made to transition to comfort care.   Acute cardiogenic pulmonary edema with bilateral pleural effusions.  Continue diuresis with furosemide .  No clinical signs of pneumonia, pneumonia was been ruled out. Supplemental 02 per [facility] for comfort.   New onset atrial fibrillation [facility]) Patient continue sinus rhythm, holding amiodarone . Continue comfort measures.   Acute kidney injury superimposed on stage 3b chronic kidney disease (HCC) Worsening renal function.  Continue with comfort care.   AMS (altered mental status) Comfort care.  Discontinue psychotropic medications   Essential hypertension Patient with persistent hypotension.  Continue comfort care.   Malnutrition of moderate degree Continue comfort measures.   Chronic deep vein  thrombosis (DVT) of left lower extremity (HCC) US  with age indeterminate deep vein thrombosis involving  the left popliteal vein.       Subjective: Patient very weak and deconditioned, her blood pressure continue to be low. She is poorly reactive.   Physical Exam: Vitals:   03/02/24 0020 03/02/24 0418 03/02/24 0730 03/02/24 0736  BP: (!) 89/77 (!) 87/74 (!) 88/67 (!) 74/59  Pulse: 64 66    Resp: 16 19 15 20   Temp:  (!) 97.4 F (36.3 C) 97.6 F (36.4 C) 97.6 F (36.4 C)  TempSrc:  Axillary Oral Oral  SpO2: (!) 87% 99%  98%  Weight:  68.1 kg    Height:       Neurology patient very lethargic, somnolent and poorly reactive, opens eyes to touch, not answering questions or following commands.  ENT with mild pallor Cardiovascular with S1 and S2 present and regular with no gallops, positive systolic murmur at the base Positive JVD Positive lower extremity edema ++++ Respiratory with poor inspiratory effort with bilateral rales with no wheezing or rhonchi  Abdomen with no distention   Data Reviewed:    Family Communication: I spoke with patient's daughter and granddaughter at the bedside, we talked in detail about patient's condition, plan of care and prognosis and all questions were addressed.   Disposition: Status is: Inpatient Remains inpatient appropriate because: imminent death   Planned Discharge Destination: imminent death     Author: Albertus Alt, MD 03/02/2024 10:59 AM  For on call review [email or web address].

## 2024-03-02 NOTE — Progress Notes (Signed)
 Pt's BP was reading 60/47 MAP 53 HR 61; attempted to notify Howerter, MD. Verbal order to stop Amiodarone  gtt. Pt's BP retaken @ 0020 that resulted as 89/77 MAP 82. Will continue to monitor.   Sonjia Durie, RN

## 2024-03-03 DIAGNOSIS — R4182 Altered mental status, unspecified: Secondary | ICD-10-CM

## 2024-03-03 DIAGNOSIS — Z66 Do not resuscitate: Secondary | ICD-10-CM | POA: Diagnosis not present

## 2024-03-03 DIAGNOSIS — Z515 Encounter for palliative care: Secondary | ICD-10-CM | POA: Diagnosis not present

## 2024-03-03 DIAGNOSIS — I5042 Chronic combined systolic (congestive) and diastolic (congestive) heart failure: Secondary | ICD-10-CM | POA: Diagnosis not present

## 2024-03-03 DIAGNOSIS — Z7189 Other specified counseling: Secondary | ICD-10-CM | POA: Diagnosis not present

## 2024-03-03 DIAGNOSIS — Z789 Other specified health status: Secondary | ICD-10-CM

## 2024-03-03 NOTE — Progress Notes (Signed)
 Heart Failure Navigator Progress Note  Assessed for Heart & Vascular TOC clinic readiness.  Patient does not meet criteria due to transitioned to comfort care.   Will sign off.  Jerilyn Monte, PharmD, BCPS Heart Failure Stewardship Pharmacist Phone [phone]

## 2024-03-03 NOTE — Progress Notes (Signed)
 Pt seen, was sleeping and difficult to arouse this am, now on comfort care, Palliative following  Deforest Fast, MD

## 2024-03-03 NOTE — Consult Note (Signed)
 Palliative Care Consult Note                                  Date: 03/03/2024   Patient Name: Diane Ross  DOB: [birth year]  MRN: [id number]  Age / Sex: 89 y.o., female  PCP: Arva Lathe, MD Referring Physician: Deforest Fast, MD  Reason for Consultation: Non pain symptom management, Pain control, Psychosocial/spiritual support, and Terminal Care  Past Medical History:  Diagnosis Date   Breast cancer (HCC)    left side   Hypertension    Thyroid  disorder     Subjective:   This NP Lizbeth Right reviewed medical records, received report from team, assessed the patient and then meet at the patient's bedside to discuss diagnosis, prognosis, GOC, EOL wishes disposition and options.  Before meeting with the patient/family, I spent time reviewing the chart including hospitalist notes, I now notes, cardiology notes.  I also reviewed labs including CMP yesterday showing progressive kidney dysfunction with creatinine up to 2.19 consistent with progressive heart failure.  Prior to seeing the patient I spoke with the bedside nurse who states that she has been quite tachycardic this morning, consistently vitals reviewed.  She is received 1 dose of Robinul this morning and 1 dose of Dilaudid about an hour and a half ago for leg pain.  I met with the patient at bedside, although she is sleeping and I elected not wake her.  Her daughter Diane Ross is also at the bedside and we excused ourselves we conference room to discuss without disturbing the patient.   We meet to discuss diagnosis prognosis, GOC, EOL wishes, disposition and options. Concept of Palliative Care was introduced as specialized medical care for people and their families living with serious illness.  If focuses on providing relief from the symptoms and stress of a serious illness.  The goal is to improve quality of life for both the patient and the family. Values and goals of care  important to patient and family were attempted to be elicited.  Created space and opportunity for patient  and family to explore thoughts and feelings regarding current medical situation   Natural trajectory and current clinical status were discussed. Questions and concerns addressed. Patient  encouraged to call with questions or concerns.    Patient/Family Understanding of Illness: Patient's daughter is a Engineer, civil (consulting) at friend's home.  She is very familiar with end-of-life.  She knows her mother has heart failure with an EF showing no 20 to 25%.  She knows that there are no further treatments that can help her mother.  We spent time discussing her clinical situation.  Goals: Comfort care with impending end-of-life  Today's Discussion: In addition to discussion described we have further discussion of various topics.  We talked about what comfort care entails. I explained comfort care as care where the patient would no longer receive aggressive medical interventions such as continuous vital signs, lab work, radiology testing, or medications not focused on comfort, peace, and dignity. This includes stopping antibiotics and weaning oxygen to room air, as these are generally not accepted as providing comfort but only prolonging the dying process artificially. All care would focus on how the patient is looking and feeling. This would include management of any symptoms that may cause discomfort, pain, shortness of breath/air hunger, increased work of breathing, cough, nausea, agitation/restlessness, anxiety, and/or secretions etc. Symptoms would be managed with medications and other non-pharmacological interventions  such as spiritual support if requested, repositioning, music therapy, or therapeutic listening. Family verbalized understanding and agreement.   We discussed comfort care being able to be performed in the hospital at end-of-life versus engagement with hospice. I described hospice as a service for  patients who have a life expectancy of 6 months or less. The goal of hospice is the preservation of dignity and quality at the end phases of life. Under hospice care, the focus changes from curative to symptom relief. I explained the three setting where hospice services can be provided including the home, at a living facility (such as LTC SNF, Assisted Living, etc), and a hospice facility. I explained that acceptance to hospice in any specific location is the final decision of the hospice medical director and bed availability, if applicable. They verbalized understanding.  At the end of this conversation patient's daughter expresses desire to remain in hospital for end-of-life.  She does not have any questions or concerns at this time.  I shared that palliative medicine will continue to follow daily while admitted for comfort care.  I provided a concomitant card with our information for any questions or concerns.  I offered spiritual care consult but she would like to hold off at this time as personal chaplain has just been in this morning.  She understands that spiritual care is available as needed for any spiritual needs.  I provided emotional and general support through therapeutic listening, empathy, sharing of stories, and other techniques. I answered all questions and addressed all concerns to the best of my ability.  Review of Systems  Unable to perform ROS   Objective:   Primary Diagnoses: Present on Admission:  AMS (altered mental status)  Chronic combined systolic and diastolic congestive heart failure (HCC)  Essential hypertension  Malnutrition of moderate degree   Vital Signs:  BP 136/85 (BP Location: Right Arm)   Pulse 93   Temp 98.4 F (36.9 C) (Oral)   Resp (!) 26   Ht 5\' 8"  (1.727 m) Comment: estimated  Wt 65.7 kg   SpO2 93%   BMI 22.02 kg/m   Physical Exam Vitals and nursing note reviewed.  Constitutional:      General: She is sleeping. She is not in acute  distress.    Appearance: She is ill-appearing.  HENT:     Head: Normocephalic and atraumatic.  Cardiovascular:     Rate and Rhythm: Tachycardia present.  Pulmonary:     Effort: Pulmonary effort is normal. No respiratory distress.  Abdominal:     General: Abdomen is flat.     Palliative Assessment/Data: 20-30   Advanced Care Planning:   Existing Vynca/ACP Documentation: MOST form signed 06/28/2022 DNR effective 06/28/2022  Primary Decision Maker: NEXT OF KIN  Pertinent diagnosis: Acute on chronic heart failure, cardiogenic shock, new onset A-fib, AKI on CKD  The patient and/or family consented to a voluntary Advance Care Planning Conversation in person. Individuals present for the conversation:  Summary of the conversation: Discussed details related to end-of-life care.  Discussed options for in-hospital end-of-life versus engagement with hospice.  Discussed elements of comfort care.  Discussed goals of care and wishes for end-of-life.  Outcome of the conversations and/or documents completed: Confirmed desire for DNR, comfort care.  Family wishes for patient to remain admitted in hospital for end-of-life rather than engaging with hospice.  I spent 20 minutes providing separately identifiable ACP services with the patient and/or surrogate decision maker in a voluntary, in-person conversation discussing the patient's wishes  and goals as detailed in the above note.  Assessment & Plan:   HPI/Patient Profile: 89 y.o. female  with past medical history of hypertension, heart failure, and history of breast cancer who presented with failure to thrive.  She was admitted on 02/28/2024 with chronic combined systolic and diastolic congestive heart failure, cardiogenic shock, new onset A-fib, AKI on CKD 3b, and others.   SUMMARY OF RECOMMENDATIONS   DNR-comfort Continue comfort care See symptom management orders below Ongoing emotional support patient and family Palliative medicine will  continue to follow for comfort care/symptom management  Symptom Management:  Tylenol  650 mg PR every 6 hours as needed mild pain or fever Biotene 15 mL topical as needed dry mouth Robinul 0.2 mg IV every 4 hours as needed excess secretions Haldol  0.5 mg IV every 4 hours as needed agitation delirium Dilaudid 0.5 to 2 mg IV q. 30 minutes as needed severe pain (7-10) for symptoms/signs of distress Zofran  4 mg IV every 6 hours as needed nausea Polyvinyl alcohol  1.4% ophthalmic 1 drop OU 4 times daily as needed dry eyes  Code Status: DNR-comfort  Prognosis:  Hours - Days  Discharge Planning:  Anticipated Hospital Death   Discussed with: Patient's daughter, bedside nursing, medical team    Thank you for allowing us  to participate in the care of Mabrey Howland PMT will continue to support holistically.  Billing based on MDM: High  Problems Addressed: One acute or chronic illness or injury that poses a threat to life or bodily function  Amount and/or Complexity of Data: Category 1:Review of prior external note(s) from each unique source, Review of the result(s) of each unique test, and Assessment requiring an independent historian(s) and Category 3:Discussion of management or test interpretation with external physician/other qualified health care professional/appropriate source (not separately reported)  Risks: Parenteral controlled substances (continue current parenteral medication regimen based on anticipated symptoms and needs thus far)   Detailed review of medical records (labs, imaging, vital signs), medically appropriate exam, discussed with treatment team, counseling and education to patient, family, & staff, documenting clinical information, medication management, coordination of care  Signed by: Lizbeth Right, NP Palliative Medicine Team  Team Phone # [phone] (Nights/Weekends)  03/03/2024, 11:15 AM

## 2024-03-03 NOTE — Plan of Care (Signed)

## 2024-03-03 NOTE — Progress Notes (Signed)
 SLP Cancellation Note  Patient Details Name: Diane Ross MRN: [id number] DOB: 03-26-1931   Cancelled treatment:       Reason Eval/Treat Not Completed: Other (comment) (Patient has transitioned to comfort care. SLP will respectfully s/o at this time.)  Jacqualine Mater, MA, CCC-SLP Speech Therapy

## 2024-03-04 DIAGNOSIS — Z515 Encounter for palliative care: Secondary | ICD-10-CM | POA: Diagnosis not present

## 2024-03-04 DIAGNOSIS — Z66 Do not resuscitate: Secondary | ICD-10-CM | POA: Diagnosis not present

## 2024-03-04 DIAGNOSIS — I5042 Chronic combined systolic (congestive) and diastolic (congestive) heart failure: Secondary | ICD-10-CM | POA: Diagnosis not present

## 2024-03-04 DIAGNOSIS — Z7189 Other specified counseling: Secondary | ICD-10-CM | POA: Diagnosis not present

## 2024-03-04 LAB — CULTURE, BLOOD (ROUTINE X 2)
Culture: NO GROWTH
Culture: NO GROWTH

## 2024-03-04 NOTE — Plan of Care (Signed)
 Pt at bedrest. Family visited this pm. Skin warm and dry. Responds to touch. No resp distress noted. Repositioned in bed with head of bed elevated. Purewick in place. Call light in reach. Sr x3 elevated. Bed in low position. Bed alarm activated.

## 2024-03-04 NOTE — Progress Notes (Signed)
 Daily Progress Note   Date: 03/04/2024   Patient Name: Diane Ross  DOB: [birth year]  MRN: [id number]  Age / Sex: 89 y.o., female  Attending Physician: Deforest Fast, MD Primary Care Physician: Arva Lathe, MD Admit Date: 02/28/2024 Length of Stay: 5 days  Reason for Consultation: Non pain symptom management, Pain control, Psychosocial/spiritual support, and Terminal Care  Past Medical History:  Diagnosis Date   Breast cancer (HCC)    left side   Hypertension    Thyroid  disorder     Subjective:   Subjective: Chart Reviewed. Updates received. Patient Assessed. Created space and opportunity for patient  and family to explore thoughts and feelings regarding current medical situation.  Today's Discussion: Today before meeting with the patient/family, I reviewed the chart including hospitalist note from yesterday afternoon and nursing notes and flowsheets.  No labs because of comfort care status.  The patient remains comfort care, vitals are stable and afebrile satting well on room air.  She received a dose of Haldol  yesterday at approximately 12:30, 2 doses of Robinul at 9:47 yesterday morning and 10:25 today.  She received 2 doses of Dilaudid in the past 24 hours at approximately 9:00 yesterday morning and around 10:30 today.  When I saw the patient at the bedside, her daughter was sitting next to her and had PNM's decline.  Patient was awake, seems confused.  The patient appears comfortable, respirations equal and nonlabored.  No grimacing or other outward signs of discomfort or distress.  Patient's daughter states that she has required some Robinul for secretions and Dilaudid for dyspnea.  She denies any significant pain.  She states they are doing well and spending time together as end-of-life approaches.  I provided emotional and general support through therapeutic listening, empathy, sharing of stories, and other techniques. I answered all questions and addressed all  concerns to the best of my ability.  Afterward I discussed with the medical team and nursing team.  Will we will continue parenteral medications including Dilaudid because of the need and anticipated escalating need due to acute on chronic heart failure approaching end-of-life.  Review of Systems  Unable to perform ROS   Objective:   Primary Diagnoses: Present on Admission:  AMS (altered mental status)  Chronic combined systolic and diastolic congestive heart failure (HCC)  Essential hypertension  Malnutrition of moderate degree   Vital Signs:  BP (!) 156/113 (BP Location: Right Arm)   Pulse 60   Temp 98 F (36.7 C) (Oral)   Resp 16   Ht 5\' 8"  (1.727 m) Comment: estimated  Wt 65.7 kg   SpO2 98%   BMI 22.02 kg/m   Physical Exam Vitals and nursing note reviewed.  Constitutional:      General: She is not in acute distress.    Appearance: She is ill-appearing.  HENT:     Head: Normocephalic and atraumatic.  Pulmonary:     Effort: Pulmonary effort is normal. No respiratory distress.  Abdominal:     General: Abdomen is flat.  Skin:    General: Skin is warm and dry.  Neurological:     Mental Status: She is alert. She is confused.     Palliative Assessment/Data: 20%   Existing Vynca/ACP Documentation: MOST form signed 06/28/2022 DNR effective 06/28/2022  Assessment & Plan:   HPI/Patient Profile:  89 y.o. female  with past medical history of hypertension, heart failure, and history of breast cancer who presented with failure to thrive.  She was admitted on 02/28/2024 with chronic  combined systolic and diastolic congestive heart failure, cardiogenic shock, new onset A-fib, AKI on CKD 3b, and others.   SUMMARY OF RECOMMENDATIONS   DNR-comfort Continue comfort care See symptom management orders below Ongoing emotional support of patient and family Palliative medicine will continue to follow for comfort care and symptom management  Symptom Management:  Tylenol  650  mg PR every 6 hours as needed mild pain or fever Biotene 15 mL topical as needed dry mouth Robinul 0.2 mg IV every 4 hours as needed excess secretions Haldol  0.5 mg IV every 4 hours as needed agitation delirium Dilaudid 0.5 to 2 mg IV q. 30 minutes as needed severe pain (7-10) for symptoms/signs of distress Zofran  4 mg IV every 6 hours as needed nausea Polyvinyl alcohol  1.4% ophthalmic 1 drop OU 4 times daily as needed dry eyes  Code Status: DNR-comfort  Prognosis: Hours - Days  Discharge Planning: Anticipated Hospital Death  Discussed with: Patient's family, medical team, bedside nursing  Thank you for allowing us  to participate in the care of Jacquelinne Speak PMT will continue to support holistically.  Billing based on MDM: High  Problems Addressed: One acute or chronic illness or injury that poses a threat to life or bodily function  Risks: Parenteral controlled substances  Detailed review of medical records (labs, imaging, vital signs), medically appropriate exam, discussed with treatment team, counseling and education to patient, family, & staff, documenting clinical information, medication management, coordination of care  Lizbeth Right, NP Palliative Medicine Team  Team Phone # [phone] (Nights/Weekends)  06/12/2021, 8:17 AM

## 2024-03-05 DIAGNOSIS — Z7189 Other specified counseling: Secondary | ICD-10-CM | POA: Diagnosis not present

## 2024-03-05 DIAGNOSIS — Z515 Encounter for palliative care: Secondary | ICD-10-CM | POA: Diagnosis not present

## 2024-03-05 DIAGNOSIS — Z66 Do not resuscitate: Secondary | ICD-10-CM | POA: Diagnosis not present

## 2024-03-05 DIAGNOSIS — I5042 Chronic combined systolic (congestive) and diastolic (congestive) heart failure: Secondary | ICD-10-CM | POA: Diagnosis not present

## 2024-03-05 MED ORDER — LORAZEPAM 2 MG/ML IJ SOLN
1.0000 mg | INTRAMUSCULAR | Status: DC | PRN
Start: 2024-03-05 — End: 2024-03-08
  Administered 2024-03-05 – 2024-03-07 (×4): 1 mg via INTRAVENOUS
  Filled 2024-03-05 (×4): qty 1

## 2024-03-05 NOTE — Progress Notes (Signed)
 Pt resting. Repositioned in bed q2hr. Pt has been medicated for increased secretions and pain. Meds have been effective. Respirations are shallow. Lower exts elevated on pillow. Call light in reach. Sr x3 elevated. Bed in low position. Bed alarm activated.

## 2024-03-05 NOTE — Progress Notes (Signed)
 Patient seen and examined, sleeping earlier this morning, was tachycardic, other vital signs were stable -Has end-stage BiV failure -May need residential hospice if vital signs remained stable  Deforest Fast, MD

## 2024-03-05 NOTE — Progress Notes (Addendum)
 Daily Progress Note   Date: 03/05/2024   Patient Name: Diane Ross  DOB: [birth year]  MRN: [id number]  Age / Sex: 89 y.o., female  Attending Physician: Deforest Fast, MD Primary Care Physician: Arva Lathe, MD Admit Date: 02/28/2024 Length of Stay: 6 days  Reason for Consultation: Non pain symptom management, Pain control, Psychosocial/spiritual support, and Terminal Care  Past Medical History:  Diagnosis Date   Breast cancer (HCC)    left side   Hypertension    Thyroid  disorder     Subjective:   Subjective: Chart Reviewed. Updates received. Patient Assessed. Created space and opportunity for patient  and family to explore thoughts and feelings regarding current medical situation.  Today's Discussion: Today before meeting with the patient/family, I reviewed the chart including hospitalist note from today and nursing notes and flowsheets.  No labs because of comfort care status.  The patient remains comfort care, vitals are stable and afebrile satting well on room air.  She received 2 doses of Robinul in the last 24 hours, at 10:25 AM yesterday and 10:42 PM yesterday.  She has received 4 doses of IV hydromorphone in the past 24 hours at 10:25 AM yesterday, 5:07 PM yesterday, 10:42 PM yesterday, and 9:18 AM this morning.  When I saw the patient at the bedside, no family was present.  She was resting comfortably, no overt signs of distress or discomfort.  She is exhibiting Cheyne-Stokes breathing indicative of approaching end-of-life.  She did have a couple periods of brief apnea as well.  Given evident need of IV opioids and other symptom management meds I will continue these as ordered.  Patient appears to be approaching end-of-life, actively dying.  Ongoing support of patient's family.  Afterward I discussed with the medical team and nursing team.  Will we will continue parenteral medications including Dilaudid because of the need and anticipated escalating need due to  acute on chronic heart failure approaching end-of-life.  Review of Systems  Unable to perform ROS   Objective:   Primary Diagnoses: Present on Admission:  AMS (altered mental status)  Chronic combined systolic and diastolic congestive heart failure (HCC)  Essential hypertension  Malnutrition of moderate degree   Vital Signs:  BP 134/84 (BP Location: Right Arm)   Pulse 94   Temp 97.8 F (36.6 C) (Oral)   Resp 18   Ht 5\' 8"  (1.727 m) Comment: estimated  Wt 65.7 kg   SpO2 93%   BMI 22.02 kg/m   Physical Exam Vitals and nursing note reviewed.  Constitutional:      General: She is not in acute distress.    Appearance: She is ill-appearing.  HENT:     Head: Normocephalic and atraumatic.  Pulmonary:     Effort: Pulmonary effort is normal. No respiratory distress.     Comments: Apparent Cheyne-Stokes respiration pattern, no labored breathing Abdominal:     General: Abdomen is flat.  Skin:    General: Skin is warm and dry.  Neurological:     Mental Status: She is alert. She is confused.     Palliative Assessment/Data: 10%   Existing Vynca/ACP Documentation: MOST form signed 06/28/2022 DNR effective 06/28/2022  Assessment & Plan:   HPI/Patient Profile:  89 y.o. female  with past medical history of hypertension, heart failure, and history of breast cancer who presented with failure to thrive.  She was admitted on 02/28/2024 with chronic combined systolic and diastolic congestive heart failure, cardiogenic shock, new onset A-fib, AKI on CKD 3b, and others.  SUMMARY OF RECOMMENDATIONS   DNR-comfort Continue comfort care See symptom management orders below Ongoing emotional support of patient and family Palliative medicine will continue to follow for comfort care and symptom management  Symptom Management:  Tylenol  650 mg PR every 6 hours as needed mild pain or fever Biotene 15 mL topical as needed dry mouth Robinul 0.2 mg IV every 4 hours as needed excess  secretions Haldol  0.5 mg IV every 4 hours as needed agitation delirium Dilaudid 0.5 to 2 mg IV q. 30 minutes as needed severe pain (7-10) for symptoms/signs of distress Zofran  4 mg IV every 6 hours as needed nausea Polyvinyl alcohol  1.4% ophthalmic 1 drop OU 4 times daily as needed dry eyes  Code Status: DNR-comfort  Prognosis: Hours - Days  Discharge Planning: Anticipated Hospital Death  Discussed with: Medical team, bedside nursing  Thank you for allowing us  to participate in the care of Clinton Wahlberg PMT will continue to support holistically.  Billing based on MDM: High  Problems Addressed: One acute or chronic illness or injury that poses a threat to life or bodily function  Risks: Parenteral controlled substances  Detailed review of medical records (labs, imaging, vital signs), medically appropriate exam, discussed with treatment team, counseling and education to patient, family, & staff, documenting clinical information, medication management, coordination of care  Lizbeth Right, NP Palliative Medicine Team  Team Phone # [phone] (Nights/Weekends)  06/12/2021, 8:17 AM

## 2024-03-05 NOTE — TOC Progression Note (Signed)
 Transition of Care [facility]) - Progression Note    Patient Details  Name: Diane Ross MRN: [id number] Date of Birth: Nov 05, 1930  [facility]) CM/SW Contact  Juliane Och, LCSW Phone Number: 03/05/2024, 4:42 PM  Clinical Narrative:     4:43 PM Per chart review, comfort care is continued.  Expected Discharge Plan: Memory Care Barriers to Discharge: Continued Medical Work up  Expected Discharge Plan and Services       Living arrangements for the past 2 months: Single Family Home                                       Social Determinants of Health (SDOH) Interventions SDOH Screenings   Transportation Needs: No Transportation Needs (05/14/2021)   Received from [facility]  Financial Resource Strain: Low Risk  (05/15/2021)   Received from [facility]  Social Connections: Unknown (02/26/2022)   Received from [facility]  Stress: No Stress Concern Present (05/13/2021)   Received from [facility]  Tobacco Use: Medium Risk (02/28/2024)    Readmission Risk Interventions     No data to display

## 2024-03-06 DIAGNOSIS — Z515 Encounter for palliative care: Secondary | ICD-10-CM

## 2024-03-06 DIAGNOSIS — Z7189 Other specified counseling: Secondary | ICD-10-CM

## 2024-03-06 DIAGNOSIS — I5042 Chronic combined systolic (congestive) and diastolic (congestive) heart failure: Secondary | ICD-10-CM | POA: Diagnosis not present

## 2024-03-06 NOTE — Progress Notes (Signed)
 Daily Progress Note   Patient Name: Diane Ross       Date: 03/06/2024 DOB: 1930/12/17  Age: 89 y.o. MRN#: [id number] Attending Physician: Deforest Fast, MD Primary Care Physician: Arva Lathe, MD Admit Date: 02/28/2024  Reason for Consultation/Follow-up: Establishing goals of care   Length of Stay: 7  Current Medications: Scheduled Meds:    Continuous Infusions:   PRN Meds: acetaminophen  **OR** acetaminophen , antiseptic oral rinse, glycopyrrolate **OR** glycopyrrolate **OR** glycopyrrolate, haloperidol  **OR** haloperidol  **OR** haloperidol  lactate, HYDROmorphone (DILAUDID) injection, LORazepam , ondansetron  **OR** ondansetron  (ZOFRAN ) IV, polyvinyl alcohol   Physical Exam Constitutional:      General: She is sleeping. She is not in acute distress.    Appearance: She is ill-appearing.  HENT:     Head: Normocephalic and atraumatic.  Pulmonary:     Comments: Uneven but not labored Skin:    General: Skin is warm and dry.             Vital Signs: BP (!) 153/102 (BP Location: Right Arm)   Pulse (!) 29   Temp 97.9 F (36.6 C) (Oral)   Resp 18   Ht 5\' 8"  (1.727 m) Comment: estimated  Wt 66.2 kg   SpO2 (!) 86%   BMI 22.19 kg/m  SpO2: SpO2: (!) 86 % O2 Device: O2 Device: Room Air O2 Flow Rate: O2 Flow Rate (L/min): 2 L/min       Palliative Assessment/Data: 20%      Patient Active Problem List   Diagnosis Date Noted   Chronic deep vein thrombosis (DVT) of left lower extremity (HCC) 03/02/2024   Cardiogenic shock (HCC) 03/01/2024   New onset atrial fibrillation (HCC) 02/29/2024   Acute kidney injury superimposed on stage 3b chronic kidney disease (HCC) 02/29/2024   AMS (altered mental status) 02/28/2024   Syncope 04/03/2021   Multiple thyroid  nodules  06/01/2018   Malnutrition of moderate degree 03/30/2018   Duodenal ulcer 03/29/2018   Acute blood loss anemia 03/29/2018   Breast cancer (HCC) 03/28/2018   Syncope, vasovagal 03/28/2018   GIB (gastrointestinal bleeding) 03/28/2018   Chronic combined systolic and diastolic congestive heart failure (HCC) 01/02/2018   Essential hypertension 01/02/2018   Hypercholesterolemia 01/02/2018   Hyperthyroidism 01/02/2018    Palliative Care Assessment & Plan   Patient Profile: 89 y.o. female with past medical history of hypertension, heart  failure, and history of breast cancer who presented with failure to thrive. She was admitted on 02/28/2024 with chronic combined systolic and diastolic congestive heart failure, cardiogenic shock, new onset A-fib, AKI on CKD 3b, and others.   Today's Discussion: Patient sleeping she looks comfortable and in no distress. Her breathing is uneven but not labored. No family at bedside. She remains on full comfort measures with anticipated hospital death. In the last 24 hours she required dilaudid four times, ativan  twice, and haldol  once. PMT will continue to support   Recommendations/Plan: DNR-comfort Continue comfort care See symptom management orders below Ongoing emotional support of patient and family Palliative medicine will continue to follow for comfort care and symptom management   Symptom Management:  Tylenol  650 mg PR every 6 hours as needed mild pain or fever Biotene 15 mL topical as needed dry mouth Robinul 0.2 mg IV every 4 hours as needed excess secretions Haldol  0.5 mg IV every 4 hours as needed agitation delirium Dilaudid 0.5 to 2 mg IV q. 30 minutes as needed severe pain (7-10) for symptoms/signs of distress Zofran  4 mg IV every 6 hours as needed nausea Polyvinyl alcohol  1.4% ophthalmic 1 drop OU 4 times daily as needed dry eyes  Goals of Care and Additional Recommendations: Limitations on Scope of Treatment: Full Comfort Care  Code  Status:    Code Status Orders  (From admission, onward)           Start     Ordered   03/02/24 1053  Do not attempt resuscitation (DNR) - Comfort care  Continuous       Question Answer Comment  If patient has no pulse and is not breathing Do Not Attempt Resuscitation   In Pre-Arrest Conditions (Patient Is Breathing and Has a Pulse) Provide comfort measures. Relieve any mechanical airway obstruction. Avoid transfer unless required for comfort.   Consent: Discussion documented in EHR or advanced directives reviewed      03/02/24 1052         Extensive chart review has been completed prior to seeing the patient including progress/consult notes, orders, medications, and available advance directive documents.  Time spent: 25 minutes  Thank you for allowing the Palliative Medicine Team to assist in the care of this patient.     Daina Drum, NP  Please contact Palliative Medicine Team phone at [phone] for questions and concerns.

## 2024-03-06 NOTE — Plan of Care (Signed)
  Problem: Pain Managment: Goal: General experience of comfort will improve and/or be controlled Outcome: Progressing   Problem: Safety: Goal: Ability to remain free from injury will improve Outcome: Progressing   Problem: Elimination: Goal: Will not experience complications related to bowel motility Outcome: Progressing

## 2024-03-06 NOTE — Plan of Care (Signed)

## 2024-03-06 NOTE — Progress Notes (Signed)
 Patient seen, sleeping comfortably, no respiratory distress -Continue comfort care -Palliative team following, may need residential hospice  Deforest Fast, MD

## 2024-03-07 DIAGNOSIS — Z515 Encounter for palliative care: Secondary | ICD-10-CM | POA: Diagnosis not present

## 2024-03-07 DIAGNOSIS — I5042 Chronic combined systolic (congestive) and diastolic (congestive) heart failure: Secondary | ICD-10-CM | POA: Diagnosis not present

## 2024-03-07 MED ORDER — HYDROMORPHONE HCL 1 MG/ML IJ SOLN
0.5000 mg | INTRAMUSCULAR | Status: DC
  Administered 2024-03-07 (×4): 0.5 mg via INTRAVENOUS
  Filled 2024-03-07 (×4): qty 0.5

## 2024-03-08 NOTE — Plan of Care (Signed)
 Time of death called at 2151, Family and provider notified and post-mortem checklist completed. Patient transported by nurse to morgue. No belongings present.

## 2024-03-14 NOTE — Progress Notes (Signed)
 Daily Progress Note   Patient Name: Diane Ross       Date: 03/03/2024 DOB: 11-02-1930  Age: 89 y.o. MRN#: [id number] Attending Physician: Deforest Fast, MD Primary Care Physician: Arva Lathe, MD Admit Date: 02/28/2024  Reason for Consultation/Follow-up: Establishing goals of care   Length of Stay: 8  Current Medications: Scheduled Meds:    Continuous Infusions:   PRN Meds: acetaminophen  **OR** acetaminophen , antiseptic oral rinse, glycopyrrolate **OR** glycopyrrolate **OR** glycopyrrolate, haloperidol  **OR** haloperidol  **OR** haloperidol  lactate, HYDROmorphone (DILAUDID) injection, LORazepam , ondansetron  **OR** ondansetron  (ZOFRAN ) IV, polyvinyl alcohol   Physical Exam Constitutional:      General: She is sleeping. She is not in acute distress.    Appearance: She is ill-appearing.  HENT:     Head: Normocephalic and atraumatic.  Pulmonary:     Comments: Uneven but not labored Skin:    General: Skin is warm and dry.             Vital Signs: BP 117/81 (BP Location: Right Arm)   Pulse 100   Temp 98 F (36.7 C)   Resp 16   Ht 5\' 8"  (1.727 m) Comment: estimated  Wt 66.2 kg   SpO2 95%   BMI 22.19 kg/m  SpO2: SpO2: 95 % O2 Device: O2 Device: Room Air       Palliative Assessment/Data: 20%      Patient Active Problem List   Diagnosis Date Noted   Chronic deep vein thrombosis (DVT) of left lower extremity (HCC) 03/02/2024   Cardiogenic shock (HCC) 03/01/2024   New onset atrial fibrillation (HCC) 02/29/2024   Acute kidney injury superimposed on stage 3b chronic kidney disease (HCC) 02/29/2024   AMS (altered mental status) 02/28/2024   Syncope 04/03/2021   Multiple thyroid  nodules 06/01/2018   Malnutrition of moderate degree 03/30/2018   Duodenal  ulcer 03/29/2018   Acute blood loss anemia 03/29/2018   Breast cancer (HCC) 03/28/2018   Syncope, vasovagal 03/28/2018   GIB (gastrointestinal bleeding) 03/28/2018   Chronic combined systolic and diastolic congestive heart failure (HCC) 01/02/2018   Essential hypertension 01/02/2018   Hypercholesterolemia 01/02/2018   Hyperthyroidism 01/02/2018    Palliative Care Assessment & Plan   Patient Profile: 89 y.o. female with past medical history of hypertension, heart failure, and history of breast cancer who presented with failure to thrive. She was  admitted on 02/28/2024 with chronic combined systolic and diastolic congestive heart failure, cardiogenic shock, new onset A-fib, AKI on CKD 3b, and others.   Today's Discussion: Patient sleeping she looks comfortable and in no distress. Her breathing is uneven but not labored. No family at bedside. She remains on full comfort measures with anticipated hospital death. In the last 24 hours she required dilaudid three times, ativan  once, and robinul twice. PMT will continue to support   11:05 am: Spoke to patient's daughter [name] by phone and then in person. [name] shared her mother looked uncomfortable when the family came to visit today. At that time she requested PRN dilaudid. She is concerned that the prn doses may be too far apart if nursing does not see her mother in pain when they assess her. We discuss adding a low scheduled dose of dilaudid. The patient will still have as needed dilaudid available for breakthrough pain. [name] agreed to this change. We discussed location for end of life care and [name] confirmed she would like her mother to remain at the hospital for end-of-life care.  Recommendations/Plan: DNR-comfort Continue comfort care See symptom management orders below Add scheduled dilaudid 0.5 mg IV every 3 hours Ongoing emotional support of patient and family Palliative medicine will continue to follow for comfort care and symptom  management   Symptom Management:  Tylenol  650 mg PR every 6 hours as needed mild pain or fever Biotene 15 mL topical as needed dry mouth Robinul 0.2 mg IV every 4 hours as needed excess secretions Haldol  0.5 mg IV every 4 hours as needed agitation delirium Dilaudid 0.5 to 2 mg IV q. 30 minutes as needed severe pain (7-10) for symptoms/signs of distress Zofran  4 mg IV every 6 hours as needed nausea Polyvinyl alcohol  1.4% ophthalmic 1 drop OU 4 times daily as needed dry eyes  Goals of Care and Additional Recommendations: Limitations on Scope of Treatment: Full Comfort Care  Code Status:    Code Status Orders  (From admission, onward)           Start     Ordered   03/02/24 1053  Do not attempt resuscitation (DNR) - Comfort care  Continuous       Question Answer Comment  If patient has no pulse and is not breathing Do Not Attempt Resuscitation   In Pre-Arrest Conditions (Patient Is Breathing and Has a Pulse) Provide comfort measures. Relieve any mechanical airway obstruction. Avoid transfer unless required for comfort.   Consent: Discussion documented in EHR or advanced directives reviewed      03/02/24 1052         Extensive chart review has been completed prior to seeing the patient including progress/consult notes, orders, medications, and available advance directive documents.  Care plan discussed with Dr. Drexel Gentles and bedside RN  Time spent: 35 minutes  Thank you for allowing the Palliative Medicine Team to assist in the care of this patient.     Daina Drum, NP  Please contact Palliative Medicine Team phone at [phone] for questions and concerns.

## 2024-03-14 NOTE — Discharge Summary (Signed)
 Death Summary  Diane Ross WJX:[id number] DOB: [birth year] DOA: 2024-03-15  PCP: Arva Lathe, MD  Admit date: 2024-03-15 Date of Death: 03/23/2024  Final Diagnoses:  Principal Problem:   Chronic combined systolic and diastolic congestive heart failure (HCC) Cardiogenic shock Metabolic encephalopathy   New onset atrial fibrillation (HCC)   Acute kidney injury superimposed on stage 3b chronic kidney disease (HCC)   AMS (altered mental status)   Essential hypertension   Malnutrition of moderate degree   Chronic deep vein thrombosis (DVT) of left lower extremity (HCC)   Cardiogenic shock (HCC)   History of present illness:    Hospital Course:   Acute on chronic combined systolic and diastolic congestive heart failure (HCC) Severe biventricular failure Severe pulmonary hypertension Cardiogenic shock Echocardiogram with EF 20 to 25%, global hypokinesis, RV systolic function with severe reduction, RVSP 64. mmHg, LA and RA with severe dilatation, moderate tricuspid valve regurgitation, mild to moderate aortic regurgitation.   -Had poor response to medical management, complicated by worsening encephalopathy shock and renal failure  - Due to advanced age and debility felt not to be a candidate for advanced therapies  - Followed by cardiology this admission, palliative care consulted and she was transitioned to comfort care  - Eventually expired on 03/23/24    New onset atrial fibrillation (HCC)   Acute kidney injury superimposed on stage 3b chronic kidney disease (HCC)   Metabolic encephalopathy   Essential hypertension   Malnutrition of moderate degree   Chronic deep vein thrombosis (DVT) of left lower extremity (HCC)     Time:  Signed:  Deforest Fast  [facility] 03/09/2024, 2:09 PM

## 2024-03-14 NOTE — Plan of Care (Signed)
  Problem: Education: Goal: Knowledge of General Education information will improve Description: Including pain rating scale, medication(s)/side effects and non-pharmacologic comfort measures Outcome: Not Applicable   Problem: Health Behavior/Discharge Planning: Goal: Ability to manage health-related needs will improve Outcome: Not Applicable   Problem: Clinical Measurements: Goal: Ability to maintain clinical measurements within normal limits will improve Outcome: Not Applicable Goal: Will remain free from infection Outcome: Not Applicable Goal: Diagnostic test results will improve Outcome: Not Applicable Goal: Respiratory complications will improve Outcome: Not Applicable Goal: Cardiovascular complication will be avoided Outcome: Not Applicable   Problem: Activity: Goal: Risk for activity intolerance will decrease Outcome: Not Applicable   Problem: Nutrition: Goal: Adequate nutrition will be maintained Outcome: Not Applicable   Problem: Coping: Goal: Level of anxiety will decrease Outcome: Not Applicable   Problem: Elimination: Goal: Will not experience complications related to bowel motility Outcome: Not Applicable Goal: Will not experience complications related to urinary retention Outcome: Not Applicable   Problem: Pain Managment: Goal: General experience of comfort will improve and/or be controlled Outcome: Not Applicable   Problem: Safety: Goal: Ability to remain free from injury will improve Outcome: Not Applicable   Problem: Skin Integrity: Goal: Risk for impaired skin integrity will decrease Outcome: Not Applicable   Problem: Education: Goal: Knowledge of the prescribed therapeutic regimen will improve Outcome: Not Applicable   Problem: Coping: Goal: Ability to identify and develop effective coping behavior will improve Outcome: Not Applicable   Problem: Clinical Measurements: Goal: Quality of life will improve Outcome: Not Applicable    Problem: Respiratory: Goal: Verbalizations of increased ease of respirations will increase Outcome: Not Applicable   Problem: Role Relationship: Goal: Family's ability to cope with current situation will improve Outcome: Not Applicable Goal: Ability to verbalize concerns, feelings, and thoughts to partner or family member will improve Outcome: Not Applicable   Problem: Pain Management: Goal: Satisfaction with pain management regimen will improve Outcome: Not Applicable

## 2024-03-14 NOTE — Plan of Care (Signed)

## 2024-03-14 NOTE — Progress Notes (Addendum)
 RN notified that patient passed away at 9:51pm on 03/28/2024. Patient was on comfort care.  Need to notify the daytime physician for death summary and death certificate.

## 2024-03-14 DEATH — deceased

## 2024-05-05 ENCOUNTER — Ambulatory Visit: Admitting: Cardiovascular Disease
